# Patient Record
Sex: Female | Born: 1945 | Race: White | Hispanic: No | Smoking: Never smoker
Health system: Southern US, Community
[De-identification: ages and names within clinical notes are randomized; demographics above are authoritative.]

## PROBLEM LIST (undated history)

## (undated) DIAGNOSIS — R7303 Prediabetes: Secondary | ICD-10-CM

## (undated) DIAGNOSIS — Z87442 Personal history of urinary calculi: Secondary | ICD-10-CM

## (undated) DIAGNOSIS — D75839 Thrombocytosis, unspecified: Secondary | ICD-10-CM

## (undated) DIAGNOSIS — M4307 Spondylolysis, lumbosacral region: Secondary | ICD-10-CM

## (undated) DIAGNOSIS — K921 Melena: Secondary | ICD-10-CM

## (undated) DIAGNOSIS — I10 Essential (primary) hypertension: Secondary | ICD-10-CM

## (undated) DIAGNOSIS — Z860100 Personal history of colon polyps, unspecified: Secondary | ICD-10-CM

## (undated) DIAGNOSIS — K5792 Diverticulitis of intestine, part unspecified, without perforation or abscess without bleeding: Secondary | ICD-10-CM

## (undated) DIAGNOSIS — M2012 Hallux valgus (acquired), left foot: Secondary | ICD-10-CM

## (undated) DIAGNOSIS — K219 Gastro-esophageal reflux disease without esophagitis: Secondary | ICD-10-CM

## (undated) DIAGNOSIS — M179 Osteoarthritis of knee, unspecified: Secondary | ICD-10-CM

## (undated) DIAGNOSIS — E7800 Pure hypercholesterolemia, unspecified: Secondary | ICD-10-CM

## (undated) DIAGNOSIS — M47816 Spondylosis without myelopathy or radiculopathy, lumbar region: Secondary | ICD-10-CM

## (undated) DIAGNOSIS — Z87898 Personal history of other specified conditions: Secondary | ICD-10-CM

## (undated) DIAGNOSIS — M0600 Rheumatoid arthritis without rheumatoid factor, unspecified site: Secondary | ICD-10-CM

## (undated) DIAGNOSIS — Z8619 Personal history of other infectious and parasitic diseases: Secondary | ICD-10-CM

## (undated) HISTORY — PX: DILATION AND CURETTAGE OF UTERUS: SHX78

## (undated) HISTORY — DX: Personal history of other specified conditions: Z87.898

## (undated) HISTORY — DX: Personal history of colon polyps, unspecified: Z86.0100

## (undated) HISTORY — DX: Personal history of colonic polyps: Z86.010

## (undated) HISTORY — DX: Melena: K92.1

## (undated) HISTORY — DX: Pure hypercholesterolemia, unspecified: E78.00

## (undated) HISTORY — DX: Essential (primary) hypertension: I10

## (undated) HISTORY — DX: Diverticulitis of intestine, part unspecified, without perforation or abscess without bleeding: K57.92

## (undated) HISTORY — DX: Rheumatoid arthritis without rheumatoid factor, unspecified site: M06.00

## (undated) HISTORY — DX: Personal history of other infectious and parasitic diseases: Z86.19

## (undated) HISTORY — PX: COLONOSCOPY: SHX174

---

## 2008-09-06 LAB — HM COLONOSCOPY

## 2009-07-16 HISTORY — PX: LEG / ANKLE SOFT TISSUE BIOPSY: SUR148

## 2010-10-31 ENCOUNTER — Ambulatory Visit (INDEPENDENT_AMBULATORY_CARE_PROVIDER_SITE_OTHER): Payer: Self-pay | Admitting: Family Medicine

## 2010-10-31 ENCOUNTER — Encounter: Payer: Self-pay | Admitting: Family Medicine

## 2010-10-31 DIAGNOSIS — Z1239 Encounter for other screening for malignant neoplasm of breast: Secondary | ICD-10-CM | POA: Insufficient documentation

## 2010-10-31 DIAGNOSIS — E785 Hyperlipidemia, unspecified: Secondary | ICD-10-CM

## 2010-10-31 DIAGNOSIS — Z131 Encounter for screening for diabetes mellitus: Secondary | ICD-10-CM

## 2010-10-31 DIAGNOSIS — Z860100 Personal history of colon polyps, unspecified: Secondary | ICD-10-CM

## 2010-10-31 DIAGNOSIS — K5792 Diverticulitis of intestine, part unspecified, without perforation or abscess without bleeding: Secondary | ICD-10-CM

## 2010-10-31 DIAGNOSIS — M1990 Unspecified osteoarthritis, unspecified site: Secondary | ICD-10-CM

## 2010-10-31 DIAGNOSIS — G4700 Insomnia, unspecified: Secondary | ICD-10-CM

## 2010-10-31 DIAGNOSIS — Z1231 Encounter for screening mammogram for malignant neoplasm of breast: Secondary | ICD-10-CM

## 2010-10-31 DIAGNOSIS — K5732 Diverticulitis of large intestine without perforation or abscess without bleeding: Secondary | ICD-10-CM

## 2010-10-31 DIAGNOSIS — M129 Arthropathy, unspecified: Secondary | ICD-10-CM

## 2010-10-31 DIAGNOSIS — K921 Melena: Secondary | ICD-10-CM

## 2010-10-31 NOTE — Progress Notes (Signed)
Subjective:    [name], female    DOB: Mar 26, 1946, 65 y.o.   MRN: [id number]  HPI Here to establish -- just moved from [place] back to [place]  Retired from career with [employer]  Is adj to retirement   Her mother is seen by Dr Ermalene Searing -- she is 54-- takes care of her a bit   Hx of arthritis - on mtx -- ? What kind -- both ? Autoimmune and osteo  Seeing Lavenia Atlas for that now  Also takes folic acid   Hx of colon polyps and diverticulosis - no infection -- gets occ cramping  Last colonoscopy was within last 2 years -- in [place] -- rec 10 y f/u    (polyps were found many years ago and last few colonosc were clear)  Insomnia -- is better since she stopped working -- no longer has to use med all the time   Hyperlipidemia - is supposed to be on simvatin   40 -- ran out in January  Diet is not as good as it should be  Walks 2 mi per day with her dog   Health mt -- last gyn exam was pap January  Last chol test was then as well   fam hx of DM   Past Medical History  Diagnosis Date  . Arthritis   . Blood in the stool     10 yrs ago  . History of chicken pox   . Diverticulitis   . Hyperlipidemia   . History of colon polyps    Past Surgical History  Procedure Date  . Leg / ankle soft tissue biopsy 2011    to confirm arthritis    reports that she has never smoked. She does not have any smokeless tobacco history on file. She reports that she does not drink alcohol or use illicit drugs. family history includes Arthritis in her father and mother; Diabetes in her father; Heart disease in her father and mother; Hyperlipidemia in her father and mother; Hypertension in her father and mother; and Pulmonary fibrosis in her father. Allergies  Allergen Reactions  . Sumycin (Tetracycline Hcl) Rash         Review of Systems  Constitutional: Negative for fever, chills and fatigue.  HENT: Negative for sore throat and sinus pressure.     Eyes: Negative for pain and visual disturbance.  Respiratory: Negative for chest tightness and wheezing.   Cardiovascular: Negative.   Gastrointestinal: Negative for abdominal pain, diarrhea, constipation and blood in stool.  Genitourinary: Negative for urgency, frequency and pelvic pain.  Musculoskeletal: Positive for joint swelling and arthralgias. Negative for myalgias.  Skin: Negative.   Neurological: Negative for weakness, light-headedness, numbness and headaches.  Hematological: Negative for adenopathy. Does not bruise/bleed easily.  Psychiatric/Behavioral: Negative for dysphoric mood. The patient is not nervous/anxious.        Objective:   Physical Exam  Constitutional: She appears well-developed and well-nourished.       overwt and well appearing   HENT:  Head: Normocephalic and atraumatic.  Right Ear: External ear normal.  Left Ear: External ear normal.  Nose: Nose normal.  Mouth/Throat: Oropharynx is clear and moist.  Eyes: Conjunctivae and EOM are normal. Pupils are equal, round, and reactive to light.  Neck: Normal range of motion. Neck supple. No JVD present. Carotid bruit is not present. No thyromegaly present.  Cardiovascular: Normal rate, regular rhythm and normal heart sounds.   Pulmonary/Chest: Effort normal and breath  sounds normal.  Abdominal: Soft. Bowel sounds are normal. She exhibits no mass. There is no tenderness.  Musculoskeletal: She exhibits tenderness. She exhibits no edema.  Lymphadenopathy:    She has no cervical adenopathy.  Neurological: She is alert. She has normal reflexes. No cranial nerve deficit. Coordination normal.  Skin: Skin is warm and dry.  Psychiatric: She has a normal mood and affect.          Assessment & Plan:

## 2010-10-31 NOTE — Assessment & Plan Note (Signed)
sched mam  Do breast exam at check up in jan

## 2010-10-31 NOTE — Assessment & Plan Note (Signed)
Disc low sat fat diet in detail Re check lipids 2 wk If high- will pick different statin from simvastatin

## 2010-10-31 NOTE — Assessment & Plan Note (Signed)
?   Autoimmune plus OA On MTX Get notes from Dr Gavin Potters

## 2010-10-31 NOTE — Assessment & Plan Note (Signed)
Check fasting sugar with 2 wk labs  Is overwt

## 2010-10-31 NOTE — Patient Instructions (Addendum)
Please send for last note and labs from [facility]  Avoid red meat/ fried foods/ egg yolks/ fatty breakfast meats/ butter, cheese and high fat dairy/ and shellfish   Please call for last colonoscopy report from [facility] Schedule fasting labs in 2 weeks Schedule 30 minute check up in January  We will schedule mammogram at check out

## 2010-10-31 NOTE — Assessment & Plan Note (Signed)
Sent for last colonosc- which was clear per pt

## 2010-11-02 ENCOUNTER — Encounter: Payer: Self-pay | Admitting: Family Medicine

## 2010-11-10 ENCOUNTER — Encounter: Payer: Self-pay | Admitting: Family Medicine

## 2010-11-14 ENCOUNTER — Encounter: Payer: Self-pay | Admitting: Family Medicine

## 2010-11-15 ENCOUNTER — Other Ambulatory Visit (INDEPENDENT_AMBULATORY_CARE_PROVIDER_SITE_OTHER): Payer: Medicare Other | Admitting: Family Medicine

## 2010-11-15 DIAGNOSIS — Z131 Encounter for screening for diabetes mellitus: Secondary | ICD-10-CM

## 2010-11-15 DIAGNOSIS — E785 Hyperlipidemia, unspecified: Secondary | ICD-10-CM

## 2010-11-15 LAB — LIPID PANEL
Cholesterol: 219 mg/dL — ABNORMAL HIGH (ref 0–200)
Total CHOL/HDL Ratio: 6

## 2010-11-17 ENCOUNTER — Encounter: Payer: Self-pay | Admitting: Family Medicine

## 2010-11-20 ENCOUNTER — Telehealth: Payer: Self-pay

## 2010-11-20 MED ORDER — ATORVASTATIN CALCIUM 10 MG PO TABS: 10.0000 mg | ORAL_TABLET | Freq: Every day | ORAL | Status: DC

## 2010-11-20 NOTE — Telephone Encounter (Signed)
Order on px pad for lipid/ast/alt 272 in approx 6 weeks In IN box

## 2010-11-20 NOTE — Telephone Encounter (Signed)
Lipitor 10 mg rx sent electronically to [facility] as instructed. Pt request written order for lab work  Lipid,ast,alt 272  And copy of lab report mailed to [address] [place] [address].

## 2010-11-20 NOTE — Telephone Encounter (Signed)
Pt called back pt picked up new Lipitor rx and was charged $45.00 copay. Pt will ck with insurance co and let Dr Milinda Antis know which statin meds they cover with a better co pay. Pt said when she was on Simvastatin she only pd $6 copay. Pt will callback with info before next refill time.

## 2010-11-20 NOTE — Telephone Encounter (Signed)
Lab order and copy of most recent lab results mailed to pt's home as requested.

## 2010-12-05 ENCOUNTER — Telehealth: Payer: Self-pay | Admitting: *Deleted

## 2010-12-05 MED ORDER — SIMVASTATIN 20 MG PO TABS: 20.0000 mg | ORAL_TABLET | Freq: Every evening | ORAL | Status: DC

## 2010-12-05 NOTE — Telephone Encounter (Signed)
Patient notified as instructed by telephone.  Medication phoned to [facility] as instructed. Pt already has a rx for lab order for lipid,ast,alt to be drawn at [facility].

## 2010-12-05 NOTE — Telephone Encounter (Signed)
Patient says that she would like to switch from lipitor to simvastatin. She says that the lipitor has a $45 copay and she can get the simvastatin with a $3 copay. Uses [facility] on [address].

## 2010-12-05 NOTE — Telephone Encounter (Signed)
Let her know that simvastain / zocor is less potent so we have to step up to 20 mg  If muscle aches or pains let me know  Px written for call in   sched fasting lab in 6 weeks for lipid/ast/alt 272

## 2010-12-06 ENCOUNTER — Ambulatory Visit: Payer: Self-pay | Admitting: Family Medicine

## 2011-04-25 ENCOUNTER — Ambulatory Visit (INDEPENDENT_AMBULATORY_CARE_PROVIDER_SITE_OTHER): Payer: Medicare Other

## 2011-04-25 DIAGNOSIS — Z23 Encounter for immunization: Secondary | ICD-10-CM

## 2011-07-27 ENCOUNTER — Ambulatory Visit: Payer: Medicare Other | Admitting: Family Medicine

## 2011-10-23 ENCOUNTER — Ambulatory Visit (INDEPENDENT_AMBULATORY_CARE_PROVIDER_SITE_OTHER): Payer: Medicare Other | Admitting: Family Medicine

## 2011-10-23 ENCOUNTER — Encounter: Payer: Self-pay | Admitting: Family Medicine

## 2011-10-23 VITALS — BP 110/72 | HR 85 | Temp 98.0°F | Ht 61.75 in | Wt 179.0 lb

## 2011-10-23 DIAGNOSIS — E669 Obesity, unspecified: Secondary | ICD-10-CM | POA: Insufficient documentation

## 2011-10-23 DIAGNOSIS — Z1231 Encounter for screening mammogram for malignant neoplasm of breast: Secondary | ICD-10-CM

## 2011-10-23 DIAGNOSIS — Z860100 Personal history of colon polyps, unspecified: Secondary | ICD-10-CM

## 2011-10-23 DIAGNOSIS — E785 Hyperlipidemia, unspecified: Secondary | ICD-10-CM

## 2011-10-23 DIAGNOSIS — Z131 Encounter for screening for diabetes mellitus: Secondary | ICD-10-CM

## 2011-10-23 LAB — LIPID PANEL
Cholesterol: 128 mg/dL (ref 0–200)
HDL: 37.4 mg/dL — ABNORMAL LOW (ref >39.00)
LDL Cholesterol: 68 mg/dL (ref 0–99)
VLDL: 22.8 mg/dL (ref 0.0–40.0)

## 2011-10-23 LAB — COMPREHENSIVE METABOLIC PANEL
BUN: 24 mg/dL — ABNORMAL HIGH (ref 6–23)
CO2: 26 mEq/L (ref 19–32)
Calcium: 9.1 mg/dL (ref 8.4–10.5)
Chloride: 106 mEq/L (ref 96–112)
Creatinine, Ser: 0.6 mg/dL (ref 0.4–1.2)
GFR: 98.34 mL/min (ref >60.00)

## 2011-10-23 LAB — TSH: TSH: 0.96 u[IU]/mL (ref 0.35–5.50)

## 2011-10-23 MED ORDER — SIMVASTATIN 20 MG PO TABS: 20.0000 mg | ORAL_TABLET | Freq: Every evening | ORAL | Status: DC

## 2011-10-23 NOTE — Assessment & Plan Note (Signed)
On zocor and diet  Lab today Disc goals for lipids and reasons to control them Rev labs with pt- last draw  Rev low sat fat diet in detail

## 2011-10-23 NOTE — Assessment & Plan Note (Signed)
Per pt last colonosc was in 2010 - and changed recall to 10 years No symptoms or bowel changes

## 2011-10-23 NOTE — Progress Notes (Signed)
Subjective:    [name], female    DOB: 1945-07-28, 66 y.o.   MRN: [id number]  HPI Here for check up of chronic medical conditions and to review health mt list  Has been feeling great overall   Normal aging things- aches and pains  Arthritis is about the same - on mobic and and also methotrexate   Rheum sees her/ blood work every 3 months   Wt is stable with bmi of 33 Diet- getting better and lost 7 lb - cutting back on sweets and carbs  Exercise-- walks 30 minutes every day   bp good 110/72  Lipids last check high with LDL in 150s Lab Results  Component Value Date   CHOL 219* 11/15/2010   HDL 35.40* 11/15/2010   LDLDIRECT 153.7 11/15/2010   TRIG 177.0* 11/15/2010   CHOLHDL 6 11/15/2010   then had them checked again at rheum Is on zocor - and no problems  Diet is fair overal and getting better   Glucose last 94  Pneumovax- declines   colonosc 2010 nl with 10 y f/u  Hx of polyps  mammo 5/12- is due for that  Self exam - no lumps   Last gyn exam - at least 1-2 years  No hx of abn paps No symptoms   dexa -- was done last week - with Dr Gavin Potters - waiting on results  Takes ca and D - every day   Patient Active Problem List  Diagnoses  . Arthritis  . Blood in the stool  . Diverticulitis  . Hyperlipidemia  . History of colon polyps  . Insomnia  . Screening for diabetes mellitus  . Other screening mammogram  . Obesity   Past Medical History  Diagnosis Date  . Blood in the stool     10 yrs ago  . History of chicken pox   . Diverticulitis   . Hyperlipidemia   . History of colon polyps   . Seronegative rheumatoid arthritis    Past Surgical History  Procedure Date  . Leg / ankle soft tissue biopsy 2011    to confirm arthritis   History  Substance Use Topics  . Smoking status: Never Smoker   . Smokeless tobacco: Not on file  . Alcohol Use: No   Family History  Problem Relation Age of Onset  . Arthritis Mother   . Heart disease Mother    . Hyperlipidemia Mother   . Hypertension Mother   . Arthritis Father   . Hyperlipidemia Father   . Heart disease Father   . Hypertension Father   . Diabetes Father   . Pulmonary fibrosis Father    Allergies  Allergen Reactions  . Sumycin (Tetracycline Hcl) Rash   Current Outpatient Prescriptions on File Prior to Visit  Medication Sig Dispense Refill  . aspirin 81 MG tablet Take 81 mg by mouth daily.        . calcium elemental as carbonate (TUMS ULTRA 1000) 400 MG tablet Chew 1,000 mg by mouth daily.        . diclofenac sodium (VOLTAREN) 1 % GEL Apply twice a day to affected area Rheumatologist prescribes       . Doxylamine Succinate, Sleep, (SLEEP AID PO) OTC as directed.       . folic acid (FOLVITE) 1 MG tablet Take 1 mg by mouth daily.        . meloxicam (MOBIC) 7.5 MG tablet Take 7.5 mg by mouth daily. with food       .  Omega-3 Fatty Acids (FISH OIL) 1000 MG CAPS Take 1,000 mg by mouth daily.        . simvastatin (ZOCOR) 20 MG tablet Take 1 tablet (20 mg total) by mouth every evening.  90 tablet  3      Review of Systems Review of Systems  Constitutional: Negative for fever, appetite change, fatigue and unexpected weight change.  Eyes: Negative for pain and visual disturbance.  Respiratory: Negative for cough and shortness of breath.   Cardiovascular: Negative for cp or palpitations    Gastrointestinal: Negative for nausea, diarrhea and constipation.  Genitourinary: Negative for urgency and frequency.  Skin: Negative for pallor or rash   Neurological: Negative for weakness, light-headedness, numbness and headaches.  Hematological: Negative for adenopathy. Does not bruise/bleed easily.  Psychiatric/Behavioral: Negative for dysphoric mood. The patient is not nervous/anxious.          Objective:   Physical Exam  Constitutional: She appears well-developed and well-nourished. No distress.  HENT:  Head: Normocephalic and atraumatic.  Right Ear: External ear normal.    Left Ear: External ear normal.  Nose: Nose normal.  Mouth/Throat: Oropharynx is clear and moist.  Eyes: Conjunctivae and EOM are normal. Pupils are equal, round, and reactive to light. No scleral icterus.  Neck: Normal range of motion. Neck supple. No JVD present. Carotid bruit is not present. No thyromegaly present.  Cardiovascular: Normal rate, regular rhythm, normal heart sounds and intact distal pulses.  Exam reveals no gallop.   Pulmonary/Chest: Effort normal and breath sounds normal. No respiratory distress. She has no wheezes.  Abdominal: Soft. Bowel sounds are normal. She exhibits no distension, no abdominal bruit and no mass. There is no tenderness.  Genitourinary: No breast swelling, tenderness, discharge or bleeding.       Breast exam: No mass, nodules, thickening, tenderness, bulging, retraction, inflamation, nipple discharge or skin changes noted.  No axillary or clavicular LA.  Chaperoned exam.    Musculoskeletal: Normal range of motion. She exhibits no edema and no tenderness.  Lymphadenopathy:    She has no cervical adenopathy.  Neurological: She is alert. She has normal reflexes. No cranial nerve deficit. She exhibits normal muscle tone. Coordination normal.  Skin: Skin is warm and dry. No rash noted. No erythema. No pallor.  Psychiatric: She has a normal mood and affect.          Assessment & Plan:

## 2011-10-23 NOTE — Patient Instructions (Signed)
Labs today  Keep working on healthy diet and exercise for weight loss  We will schedule mammogram at check out  Try to get 1200-1500 mg of calcium per day with at least 1000 iu of vitamin D - for bone health

## 2011-10-23 NOTE — Assessment & Plan Note (Signed)
With obesity pt is at risk  Talked about imp of wt loss cmet today incl fasting glucose

## 2011-10-23 NOTE — Assessment & Plan Note (Signed)
Discussed how this problem influences overall health and the risks it imposes  Reviewed plan for weight loss with lower calorie diet (via better food choices and also portion control or program like weight watchers) and exercise building up to or more than 30 minutes 5 days per week including some aerobic activity    

## 2011-10-23 NOTE — Assessment & Plan Note (Signed)
Scheduled annual screening mammogram Nl breast exam today  Encouraged monthly self exams   

## 2011-10-25 ENCOUNTER — Encounter: Payer: Self-pay | Admitting: *Deleted

## 2011-10-30 ENCOUNTER — Encounter: Payer: Self-pay | Admitting: Family Medicine

## 2011-11-05 ENCOUNTER — Telehealth: Payer: Self-pay | Admitting: *Deleted

## 2011-11-05 NOTE — Telephone Encounter (Signed)
Received a form by mail (Annual Wellness Exam Reward Form) from patient requesting that we fill it out and mail it in for her.  Is this ok to do?  Form in your IN basket.

## 2011-11-05 NOTE — Telephone Encounter (Signed)
Form completed and mailed to [facility], patient advised via message left on machine at home number.

## 2011-11-05 NOTE — Telephone Encounter (Signed)
Done and in IN box thanks 

## 2011-12-07 ENCOUNTER — Ambulatory Visit: Payer: Self-pay | Admitting: Family Medicine

## 2011-12-11 ENCOUNTER — Encounter: Payer: Self-pay | Admitting: Family Medicine

## 2011-12-12 ENCOUNTER — Encounter: Payer: Self-pay | Admitting: *Deleted

## 2011-12-12 ENCOUNTER — Other Ambulatory Visit: Payer: Self-pay | Admitting: Family Medicine

## 2011-12-15 ENCOUNTER — Other Ambulatory Visit: Payer: Self-pay | Admitting: Family Medicine

## 2012-04-08 ENCOUNTER — Ambulatory Visit (INDEPENDENT_AMBULATORY_CARE_PROVIDER_SITE_OTHER): Payer: Medicare Other

## 2012-04-08 DIAGNOSIS — Z23 Encounter for immunization: Secondary | ICD-10-CM

## 2012-12-09 ENCOUNTER — Other Ambulatory Visit: Payer: Self-pay

## 2012-12-09 MED ORDER — SIMVASTATIN 20 MG PO TABS: 20.0000 mg | ORAL_TABLET | Freq: Every evening | ORAL | Status: DC

## 2012-12-09 NOTE — Telephone Encounter (Signed)
Pt left v/m requesting refill simvastatin; pt scheduled CPX 02/13/13. Notified pt refill done.

## 2013-02-13 ENCOUNTER — Encounter: Payer: Medicare Other | Admitting: Family Medicine

## 2013-03-17 ENCOUNTER — Encounter: Payer: Self-pay | Admitting: Internal Medicine

## 2013-03-17 ENCOUNTER — Ambulatory Visit (INDEPENDENT_AMBULATORY_CARE_PROVIDER_SITE_OTHER): Payer: Medicare Other | Admitting: Internal Medicine

## 2013-03-17 VITALS — BP 122/82 | HR 90 | Temp 98.1°F | Ht 61.25 in | Wt 178.5 lb

## 2013-03-17 DIAGNOSIS — K5792 Diverticulitis of intestine, part unspecified, without perforation or abscess without bleeding: Secondary | ICD-10-CM

## 2013-03-17 DIAGNOSIS — Z86010 Personal history of colonic polyps: Secondary | ICD-10-CM

## 2013-03-17 DIAGNOSIS — N318 Other neuromuscular dysfunction of bladder: Secondary | ICD-10-CM

## 2013-03-17 DIAGNOSIS — K5732 Diverticulitis of large intestine without perforation or abscess without bleeding: Secondary | ICD-10-CM

## 2013-03-17 DIAGNOSIS — N3281 Overactive bladder: Secondary | ICD-10-CM

## 2013-03-17 DIAGNOSIS — Z800 Family history of malignant neoplasm of digestive organs: Secondary | ICD-10-CM

## 2013-03-17 DIAGNOSIS — R351 Nocturia: Secondary | ICD-10-CM

## 2013-03-17 DIAGNOSIS — E785 Hyperlipidemia, unspecified: Secondary | ICD-10-CM

## 2013-03-17 DIAGNOSIS — Z23 Encounter for immunization: Secondary | ICD-10-CM

## 2013-03-17 DIAGNOSIS — M129 Arthropathy, unspecified: Secondary | ICD-10-CM

## 2013-03-17 DIAGNOSIS — Z1231 Encounter for screening mammogram for malignant neoplasm of breast: Secondary | ICD-10-CM

## 2013-03-17 DIAGNOSIS — M1990 Unspecified osteoarthritis, unspecified site: Secondary | ICD-10-CM

## 2013-03-17 LAB — URINALYSIS, ROUTINE W REFLEX MICROSCOPIC
Bilirubin Urine: NEGATIVE
Hgb urine dipstick: NEGATIVE
Nitrite: NEGATIVE
Urobilinogen, UA: 0.2 (ref 0.0–1.0)

## 2013-03-17 LAB — CBC WITH DIFFERENTIAL/PLATELET
Basophils Absolute: 0.1 10*3/uL (ref 0.0–0.1)
Eosinophils Absolute: 0.2 10*3/uL (ref 0.0–0.7)
HCT: 40.5 % (ref 36.0–46.0)
Lymphs Abs: 1.5 10*3/uL (ref 0.7–4.0)
Monocytes Absolute: 0.6 10*3/uL (ref 0.1–1.0)
Monocytes Relative: 8.3 % (ref 3.0–12.0)
Platelets: 309 10*3/uL (ref 150.0–400.0)
RDW: 16.6 % — ABNORMAL HIGH (ref 11.5–14.6)

## 2013-03-17 LAB — LIPID PANEL
LDL Cholesterol: 78 mg/dL (ref 0–99)
VLDL: 29 mg/dL (ref 0.0–40.0)

## 2013-03-17 LAB — COMPREHENSIVE METABOLIC PANEL
ALT: 35 U/L (ref 0–35)
AST: 32 U/L (ref 0–37)
Alkaline Phosphatase: 84 U/L (ref 39–117)
Sodium: 135 mEq/L (ref 135–145)
Total Bilirubin: 0.7 mg/dL (ref 0.3–1.2)
Total Protein: 7.2 g/dL (ref 6.0–8.3)

## 2013-03-18 ENCOUNTER — Other Ambulatory Visit: Payer: Self-pay | Admitting: *Deleted

## 2013-03-18 MED ORDER — TOLTERODINE TARTRATE 2 MG PO TABS: 2.0000 mg | ORAL_TABLET | Freq: Two times a day (BID) | ORAL | Status: DC

## 2013-03-18 NOTE — Telephone Encounter (Signed)
Rx sent through [facility] & pt notified

## 2013-03-19 ENCOUNTER — Telehealth: Payer: Self-pay | Admitting: *Deleted

## 2013-03-19 ENCOUNTER — Encounter: Payer: Self-pay | Admitting: Internal Medicine

## 2013-03-19 DIAGNOSIS — Z800 Family history of malignant neoplasm of digestive organs: Secondary | ICD-10-CM | POA: Insufficient documentation

## 2013-03-19 DIAGNOSIS — N3281 Overactive bladder: Secondary | ICD-10-CM | POA: Insufficient documentation

## 2013-03-19 NOTE — Assessment & Plan Note (Signed)
Up to date with colonoscopy.  Obtain records.

## 2013-03-19 NOTE — Assessment & Plan Note (Signed)
No problems reported.  Bowels stable.  Follow.

## 2013-03-19 NOTE — Progress Notes (Signed)
Subjective:    [name], female    DOB: 06/18/1946, 67 y.o.   MRN: [id number]  HPI 67 year old female with past history of seronegative arthritis and hypercholesterolemia who comes in today to follow up on these issues as well as to establish care.  Former pt of Dr [employer].  States she has a history of abnormal pap in the 70s.  Last pap 2011 - ok.  She is on simvastatin for her cholesterol.  Ran out last week.  Sees Dr Gavin Potters for her arthritis.  She reports having DDD and spondylolisthesis - back.  Seronegative arthritis.  On MTX and uses voltaren gel.  Also, takes Meloxicam.  Seeing Dr Yves Dill for her back.  Gives her Tramadol.  Walks everyday.  No cardiac symptoms with increased activity or exertion.  Breathing stable.  No increased acid reflux.  Eating and drinking well.  Does report having increased urinary frequency and nocturia.  Has to get up 3-4x/night.  Wants something to treat this.  Is up to date with colonoscopy.  Recommended follow up colonoscopy 10 years.     Past Medical History  Diagnosis Date  . Blood in the stool     10 yrs ago  . History of chicken pox   . Diverticulitis   . Hypercholesterolemia   . History of colon polyps   . Seronegative rheumatoid arthritis   . History of abnormal Pap smear     Outpatient Encounter Prescriptions as of 03/17/2013  Medication Sig Dispense Refill  . aspirin 81 MG tablet Take 81 mg by mouth daily.        . calcium elemental as carbonate (TUMS ULTRA 1000) 400 MG tablet Chew 1,000 mg by mouth daily.        . diclofenac sodium (VOLTAREN) 1 % GEL Apply twice a day to affected area Rheumatologist prescribes       . Doxylamine Succinate, Sleep, (SLEEP AID PO) OTC as directed.       . folic acid (FOLVITE) 1 MG tablet Take 1 mg by mouth daily.        . meloxicam (MOBIC) 7.5 MG tablet Take 7.5 mg by mouth daily. with food       . methotrexate (RHEUMATREX) 10 MG tablet Take 10 mg by mouth once a week. Caution: Chemotherapy.  Protect from light.      . Multiple Vitamins-Minerals (CENTRUM ULTRA WOMENS PO) Take by mouth.      . Omega-3 Fatty Acids (FISH OIL) 1000 MG CAPS Take 1,000 mg by mouth daily.        . simvastatin (ZOCOR) 20 MG tablet Take 1 tablet (20 mg total) by mouth every evening.  90 tablet  0  . traMADol (ULTRAM) 50 MG tablet Take 50 mg by mouth every 6 (six) hours as needed for pain.       No facility-administered encounter medications on file as of 03/17/2013.    Review of Systems Patient denies any headache, lightheadedness or dizziness.  No sinus or allergy symptoms.  No chest pain, tightness or palpitations.  No increased shortness of breath, cough or congestion.  No nausea or vomiting.  No acid reflux.  No abdominal pain or cramping.  No bowel change, such as diarrhea, constipation, BRBPR or melana.  Increased urinary frequency and nocturia as outlined.  No vaginal symptoms.  Walks daily.  Follows with Dr Gavin Potters for her arthritis.  Stable.       Objective:   Physical Exam Filed Vitals:  03/17/13 1042  BP: 122/82  Pulse: 90  Temp: 98.1 F (24.60 C)   67 year old female in no acute distress.   HEENT:  Nares- clear.  Oropharynx - without lesions. NECK:  Supple.  Nontender.  No audible bruit.  HEART:  Appears to be regular. LUNGS:  No crackles or wheezing audible.  Respirations even and unlabored.  RADIAL PULSE:  Equal bilaterally.  ABDOMEN:  Soft, nontender.  Bowel sounds present and normal.  No audible abdominal bruit.    EXTREMITIES:  No increased edema present.  DP pulses palpable and equal bilaterally.   SKIN:  No lestions.          Assessment & Plan:  HEALTH MAINTENANCE.  Will schedule her for a physical.  Review records.  Schedule mammogram when due.  Up to date with colonoscopy.  Obtain records.  States had recommended follow up colonoscopy in 10 years after last.  Pneumovax given today.   I spent 45 minutes with the patient and more than 50% of the time was spent in consultation  regarding the above.

## 2013-03-19 NOTE — Assessment & Plan Note (Signed)
Discussed at length with her today.  Present for a while.  Not resting well secondary to having to get up multiple times a night.  Check urinalysis to confirm no infection, etc.  Start Detrol 2mg  bid as directed.  Discussed possible side effects.  Follow.

## 2013-03-19 NOTE — Assessment & Plan Note (Signed)
States up to date with colonoscopy.  Obtain copy.  States recommended follow up in 10 years.

## 2013-03-19 NOTE — Assessment & Plan Note (Signed)
Low cholesterol diet and exercise.  On simvastatin.  Check lipid panel and liver function.    

## 2013-03-19 NOTE — Assessment & Plan Note (Signed)
Keep up to date with mammograms.

## 2013-03-19 NOTE — Telephone Encounter (Signed)
Received PA request form placed in Dr. Marina Goodell folder

## 2013-03-19 NOTE — Telephone Encounter (Signed)
Called [phone] for prior authorization on the Tolterodine 2 mg tab, form is being faxed over

## 2013-03-19 NOTE — Assessment & Plan Note (Signed)
Followed by Dr Gavin Potters.  On MTX.  Obtain records.  Also takes meloxicam and uses voltaren gel.  Follow.

## 2013-03-24 ENCOUNTER — Encounter: Payer: Self-pay | Admitting: Internal Medicine

## 2013-03-24 ENCOUNTER — Other Ambulatory Visit: Payer: Self-pay | Admitting: Family Medicine

## 2013-03-25 ENCOUNTER — Other Ambulatory Visit: Payer: Self-pay | Admitting: *Deleted

## 2013-03-25 ENCOUNTER — Telehealth: Payer: Self-pay | Admitting: Internal Medicine

## 2013-03-25 MED ORDER — SIMVASTATIN 20 MG PO TABS: 20.0000 mg | ORAL_TABLET | Freq: Every evening | ORAL | Status: DC

## 2013-03-25 NOTE — Telephone Encounter (Signed)
Faxes apparently were never received because this is the first refill request I have seen. Sent pt a mychart to let her know Rx has been sent to [facility].

## 2013-03-25 NOTE — Telephone Encounter (Signed)
[facility] states they faxed over request x2 days ago and this morning for simvastatin 20 mg.  Asking for confirmation that this was received.

## 2013-03-26 ENCOUNTER — Ambulatory Visit: Payer: Self-pay | Admitting: Internal Medicine

## 2013-04-03 ENCOUNTER — Encounter: Payer: Self-pay | Admitting: Internal Medicine

## 2013-04-03 MED ORDER — OXYBUTYNIN CHLORIDE ER 5 MG PO TB24: 5.0000 mg | ORAL_TABLET | Freq: Every day | ORAL | Status: DC

## 2013-04-03 NOTE — Telephone Encounter (Signed)
Please notify pt that I sent in the Ditropan for her to try.  See her my chart message.  She wanted to be called with the response.

## 2013-04-06 ENCOUNTER — Encounter: Payer: Self-pay | Admitting: Internal Medicine

## 2013-04-16 ENCOUNTER — Ambulatory Visit (INDEPENDENT_AMBULATORY_CARE_PROVIDER_SITE_OTHER): Payer: Medicare Other

## 2013-04-16 DIAGNOSIS — Z23 Encounter for immunization: Secondary | ICD-10-CM

## 2013-05-25 ENCOUNTER — Encounter: Payer: Self-pay | Admitting: Internal Medicine

## 2013-05-25 ENCOUNTER — Ambulatory Visit (INDEPENDENT_AMBULATORY_CARE_PROVIDER_SITE_OTHER): Payer: Medicare Other | Admitting: Internal Medicine

## 2013-05-25 VITALS — BP 130/90 | HR 84 | Temp 98.2°F | Ht 61.25 in | Wt 179.5 lb

## 2013-05-25 DIAGNOSIS — Z1231 Encounter for screening mammogram for malignant neoplasm of breast: Secondary | ICD-10-CM

## 2013-05-25 DIAGNOSIS — Z800 Family history of malignant neoplasm of digestive organs: Secondary | ICD-10-CM

## 2013-05-25 DIAGNOSIS — M1990 Unspecified osteoarthritis, unspecified site: Secondary | ICD-10-CM

## 2013-05-25 DIAGNOSIS — Z86010 Personal history of colonic polyps: Secondary | ICD-10-CM

## 2013-05-25 DIAGNOSIS — Z1211 Encounter for screening for malignant neoplasm of colon: Secondary | ICD-10-CM

## 2013-05-25 DIAGNOSIS — N3281 Overactive bladder: Secondary | ICD-10-CM

## 2013-05-25 DIAGNOSIS — E785 Hyperlipidemia, unspecified: Secondary | ICD-10-CM

## 2013-05-25 DIAGNOSIS — N318 Other neuromuscular dysfunction of bladder: Secondary | ICD-10-CM

## 2013-05-25 DIAGNOSIS — M129 Arthropathy, unspecified: Secondary | ICD-10-CM

## 2013-05-25 MED ORDER — TOLTERODINE TARTRATE 2 MG PO TABS: 2.0000 mg | ORAL_TABLET | Freq: Two times a day (BID) | ORAL | Status: DC

## 2013-05-25 NOTE — Progress Notes (Signed)
Pre-visit discussion using our clinic review tool. No additional management support is needed unless otherwise documented below in the visit note.  

## 2013-05-26 ENCOUNTER — Encounter: Payer: Self-pay | Admitting: Internal Medicine

## 2013-05-26 NOTE — Assessment & Plan Note (Signed)
Up to date with colonoscopy as outlined.    

## 2013-05-26 NOTE — Progress Notes (Signed)
Subjective:    [name], female    DOB: 11-19-1945, 67 y.o.   MRN: [id number]  HPI 67 year old female with past history of seronegative arthritis and hypercholesterolemia who comes in today to follow up on these issues as well as for a complete physical exam.  States she has a history of abnormal pap in the 70s.  Last pap 2011 - ok.  She is on simvastatin for her cholesterol.  Sees Dr Gavin Potters for her arthritis.  She reports having DDD and spondylolisthesis - back.  Seronegative arthritis.  On MTX and uses voltaren gel.  Also, takes Meloxicam.  Seeing Dr Yves Dill for her back.  Gives her Tramadol.  Walks everyday.  No cardiac symptoms with increased activity or exertion.  Breathing stable.  No increased acid reflux.  Eating and drinking well.  Does report having increased urinary frequency and nocturia.  I had put her on detrol last visit.  She wants to change to the short acting detrol.   Is up to date with colonoscopy.  Recommended follow up colonoscopy.  Overall she feels she is doing well.    Past Medical History  Diagnosis Date  . Blood in the stool     10 yrs ago  . History of chicken pox   . Diverticulitis   . Hypercholesterolemia   . History of colon polyps   . Seronegative rheumatoid arthritis   . History of abnormal Pap smear     Outpatient Encounter Prescriptions as of 05/25/2013  Medication Sig  . aspirin 81 MG tablet Take 81 mg by mouth daily.    . calcium elemental as carbonate (TUMS ULTRA 1000) 400 MG tablet Chew 1,000 mg by mouth daily.    . diclofenac sodium (VOLTAREN) 1 % GEL Apply twice a day to affected area Rheumatologist prescribes   . Doxylamine Succinate, Sleep, (SLEEP AID PO) OTC as directed.   . folic acid (FOLVITE) 1 MG tablet Take 1 mg by mouth daily.    . meloxicam (MOBIC) 7.5 MG tablet Take 7.5 mg by mouth daily. with food   . methotrexate (RHEUMATREX) 10 MG tablet Take 10 mg by mouth once a week. Caution: Chemotherapy. Protect from light.   . Multiple Vitamins-Minerals (CENTRUM ULTRA WOMENS PO) Take by mouth.  . Omega-3 Fatty Acids (FISH OIL) 1000 MG CAPS Take 1,000 mg by mouth daily.    . simvastatin (ZOCOR) 20 MG tablet Take 1 tablet (20 mg total) by mouth every evening.  . traMADol (ULTRAM) 50 MG tablet Take 50 mg by mouth every 6 (six) hours as needed for pain.  Marland Kitchen oxybutynin (DITROPAN XL) 5 MG 24 hr tablet Take 1 tablet (5 mg total) by mouth daily.  Marland Kitchen tolterodine (DETROL) 2 MG tablet Take 1 tablet (2 mg total) by mouth 2 (two) times daily.    Review of Systems Patient denies any headache, lightheadedness or dizziness.  No sinus or allergy symptoms.  No chest pain, tightness or palpitations.  No increased shortness of breath, cough or congestion.  No nausea or vomiting.  No acid reflux.  No abdominal pain or cramping.  No bowel change, such as diarrhea, constipation, BRBPR or melana.  Increased urinary frequency and nocturia as outlined.  No vaginal symptoms.  Walks daily.  Follows with Dr Gavin Potters for her arthritis.  Stable.       Objective:   Physical Exam  Filed Vitals:   05/25/13 1053  BP: 130/90  Pulse: 84  Temp: 98.2 F (36.8  C)   Blood pressure recheck:  60/63  67 year old female in no acute distress.   HEENT:  Nares- clear.  Oropharynx - without lesions. NECK:  Supple.  Nontender.  No audible bruit.  HEART:  Appears to be regular. LUNGS:  No crackles or wheezing audible.  Respirations even and unlabored.  RADIAL PULSE:  Equal bilaterally.    BREASTS:  No nipple discharge or nipple retraction present.  Could not appreciate any distinct nodules or axillary adenopathy.  ABDOMEN:  Soft, nontender.  Bowel sounds present and normal.  No audible abdominal bruit.  GU: not performed.    EXTREMITIES:  No increased edema present.  DP pulses palpable and equal bilaterally.          Assessment & Plan:  HEALTH MAINTENANCE.  Physical today.  Mammogram 03/26/13 - ok.  Up to date with colonoscopy.   States had  recommended follow up colonoscopy in 10 years after last.  Pneumovax given last visit.  IFOB given.

## 2013-05-26 NOTE — Assessment & Plan Note (Signed)
Start Detrol 2mg  bid as directed.  Discussed possible side effects.  Follow.

## 2013-05-26 NOTE — Assessment & Plan Note (Signed)
Mammogram 03/26/13 - ok.

## 2013-05-26 NOTE — Assessment & Plan Note (Signed)
Low cholesterol diet and exercise.  On simvastatin.  Check lipid panel and liver function.  Cholesterol 03/17/13 revealed total cholesterol 145, triglycerides 145, HDL 38 and LDL 78.

## 2013-05-26 NOTE — Assessment & Plan Note (Signed)
States up to date with colonoscopy.  Obtain copy.  States recommended follow up in 10 years.  She will provide this information for me.

## 2013-05-26 NOTE — Assessment & Plan Note (Signed)
Followed by Dr Gavin Potters.  On MTX.  Also takes meloxicam and uses voltaren gel.  Follow.

## 2013-05-28 ENCOUNTER — Encounter: Payer: Self-pay | Admitting: Internal Medicine

## 2013-05-28 MED ORDER — OXYBUTYNIN CHLORIDE 5 MG PO TABS: 5.0000 mg | ORAL_TABLET | Freq: Two times a day (BID) | ORAL | Status: DC

## 2013-05-28 NOTE — Telephone Encounter (Signed)
Sent in ditropan (immediate release) #60 with one refill.

## 2013-06-19 ENCOUNTER — Other Ambulatory Visit (INDEPENDENT_AMBULATORY_CARE_PROVIDER_SITE_OTHER): Payer: Medicare Other

## 2013-06-19 DIAGNOSIS — Z1211 Encounter for screening for malignant neoplasm of colon: Secondary | ICD-10-CM

## 2013-06-19 LAB — FECAL OCCULT BLOOD, IMMUNOCHEMICAL: Fecal Occult Bld: NEGATIVE

## 2013-06-22 ENCOUNTER — Encounter: Payer: Self-pay | Admitting: Internal Medicine

## 2013-06-24 NOTE — Telephone Encounter (Signed)
Mailed unread message to pt  

## 2013-08-10 ENCOUNTER — Encounter: Payer: Self-pay | Admitting: Podiatry

## 2013-08-10 ENCOUNTER — Ambulatory Visit (INDEPENDENT_AMBULATORY_CARE_PROVIDER_SITE_OTHER): Payer: Medicare Other

## 2013-08-10 ENCOUNTER — Ambulatory Visit (INDEPENDENT_AMBULATORY_CARE_PROVIDER_SITE_OTHER): Payer: Medicare Other | Admitting: Podiatry

## 2013-08-10 VITALS — BP 137/86 | HR 105 | Resp 16 | Ht 62.0 in | Wt 177.0 lb

## 2013-08-10 DIAGNOSIS — M21619 Bunion of unspecified foot: Secondary | ICD-10-CM

## 2013-08-10 DIAGNOSIS — M2010 Hallux valgus (acquired), unspecified foot: Secondary | ICD-10-CM

## 2013-08-10 DIAGNOSIS — M2040 Other hammer toe(s) (acquired), unspecified foot: Secondary | ICD-10-CM

## 2013-08-10 DIAGNOSIS — B351 Tinea unguium: Secondary | ICD-10-CM

## 2013-08-10 MED ORDER — EFINACONAZOLE 10 % EX SOLN: 1.0000 [drp] | Freq: Every day | CUTANEOUS | Status: DC

## 2013-08-10 NOTE — Progress Notes (Signed)
   Subjective:    [name], female    DOB: Nov 19, 1945, 68 y.o.   MRN: [id number]  HPI Comments: Both feet have bunions and also a hammer toe on the left foot which is making it difficult to wear shoes,  Its been  Ongoing just getting worse and its time do something about it. Also i have a toenail that is fungus. Right great toenail thick      Review of Systems  Musculoskeletal: Positive for back pain.  Hematological: Bruises/bleeds easily.  All other systems reviewed and are negative.       Objective:   Physical Exam: I have reviewed her past medical history medications allergies surgeries social history and review of systems. Vital signs are stable she is alert and oriented x3. Pulses are strongly palpable bilateral. Neurologic sensorium is intact bilateral. Deep tendon reflexes are 1 elicitable muscle strength 4/5 bilateral dorsiflexors plantar flexors inverters and everters all intrinsic musculature is intact. Orthopedic evaluation demonstrates severe hallux abductovalgus deformities bilateral. Severe hammertoe deformity second digit bilateral. Radiographic evaluation demonstrates complete dislocation of the first metatarsal and second metatarsophalangeal joints. 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 Cutaneous evaluation demonstrates supple while hydrated cutis for the most part. She does have some distal onychomycosis with sharp incurvated nail margins to the hallux right.        Assessment & Plan:  Assessment: Hallux abductovalgus deformity bilateral left greater than right. Complete rupture of her plantar plate with dislocation the second toe and hammertoe deformity bilateral. Left greater than right. Possible onychomycosis hallux right.  Plan: Discussed etiology pathology conservative versus surgical therapies. At this point we will wait until she returns from her trip to [place] during the summer prior to surgical intervention of her left  foot.

## 2013-08-10 NOTE — Patient Instructions (Signed)

## 2013-09-15 ENCOUNTER — Other Ambulatory Visit: Payer: Self-pay | Admitting: Internal Medicine

## 2013-11-23 ENCOUNTER — Other Ambulatory Visit: Payer: Medicare Other

## 2013-11-25 ENCOUNTER — Ambulatory Visit: Payer: Medicare Other | Admitting: Internal Medicine

## 2014-01-19 DIAGNOSIS — M5137 Other intervertebral disc degeneration, lumbosacral region: Secondary | ICD-10-CM | POA: Insufficient documentation

## 2014-01-19 DIAGNOSIS — M47817 Spondylosis without myelopathy or radiculopathy, lumbosacral region: Secondary | ICD-10-CM | POA: Insufficient documentation

## 2014-01-19 DIAGNOSIS — M51379 Other intervertebral disc degeneration, lumbosacral region without mention of lumbar back pain or lower extremity pain: Secondary | ICD-10-CM | POA: Insufficient documentation

## 2014-01-22 ENCOUNTER — Other Ambulatory Visit: Payer: Self-pay | Admitting: Internal Medicine

## 2014-03-17 ENCOUNTER — Other Ambulatory Visit: Payer: Self-pay | Admitting: Internal Medicine

## 2014-03-17 ENCOUNTER — Encounter: Payer: Self-pay | Admitting: Internal Medicine

## 2014-03-17 DIAGNOSIS — E7800 Pure hypercholesterolemia, unspecified: Secondary | ICD-10-CM

## 2014-03-17 MED ORDER — SIMVASTATIN 20 MG PO TABS: ORAL_TABLET | ORAL | Status: DC

## 2014-03-17 NOTE — Telephone Encounter (Signed)
See mychart message. Last fasting labs 9/14, want her to make lab appt sooner than November?

## 2014-03-17 NOTE — Progress Notes (Signed)
Orders placed for f/u labs.  

## 2014-03-25 ENCOUNTER — Other Ambulatory Visit (INDEPENDENT_AMBULATORY_CARE_PROVIDER_SITE_OTHER): Payer: Medicare Other

## 2014-03-25 ENCOUNTER — Encounter: Payer: Self-pay | Admitting: Internal Medicine

## 2014-03-25 DIAGNOSIS — E7800 Pure hypercholesterolemia, unspecified: Secondary | ICD-10-CM

## 2014-03-25 LAB — TSH: TSH: 2.4 u[IU]/mL (ref 0.35–4.50)

## 2014-03-25 LAB — CBC WITH DIFFERENTIAL/PLATELET
BASOS ABS: 0.1 10*3/uL (ref 0.0–0.1)
BASOS PCT: 0.8 % (ref 0.0–3.0)
EOS ABS: 0.3 10*3/uL (ref 0.0–0.7)
Eosinophils Relative: 3.7 % (ref 0.0–5.0)
HCT: 40.4 % (ref 36.0–46.0)
Hemoglobin: 13.2 g/dL (ref 12.0–15.0)
LYMPHS PCT: 27.4 % (ref 12.0–46.0)
Lymphs Abs: 1.9 10*3/uL (ref 0.7–4.0)
MCHC: 32.5 g/dL (ref 30.0–36.0)
MCV: 85.8 fl (ref 78.0–100.0)
MONO ABS: 0.6 10*3/uL (ref 0.1–1.0)
Monocytes Relative: 8.2 % (ref 3.0–12.0)
Neutro Abs: 4.2 10*3/uL (ref 1.4–7.7)
Neutrophils Relative %: 59.9 % (ref 43.0–77.0)
PLATELETS: 352 10*3/uL (ref 150.0–400.0)
RBC: 4.72 Mil/uL (ref 3.87–5.11)
RDW: 17.4 % — AB (ref 11.5–15.5)
WBC: 6.9 10*3/uL (ref 4.0–10.5)

## 2014-03-25 LAB — COMPREHENSIVE METABOLIC PANEL
ALBUMIN: 3.9 g/dL (ref 3.5–5.2)
ALK PHOS: 87 U/L (ref 39–117)
ALT: 24 U/L (ref 0–35)
AST: 26 U/L (ref 0–37)
BUN: 18 mg/dL (ref 6–23)
CHLORIDE: 104 meq/L (ref 96–112)
CO2: 25 mEq/L (ref 19–32)
Calcium: 9.2 mg/dL (ref 8.4–10.5)
Creatinine, Ser: 0.6 mg/dL (ref 0.4–1.2)
GFR: 99.43 mL/min (ref >60.00)
Glucose, Bld: 87 mg/dL (ref 70–99)
POTASSIUM: 4.3 meq/L (ref 3.5–5.1)
SODIUM: 137 meq/L (ref 135–145)
Total Bilirubin: 0.6 mg/dL (ref 0.2–1.2)
Total Protein: 7.2 g/dL (ref 6.0–8.3)

## 2014-03-25 LAB — LIPID PANEL
CHOL/HDL RATIO: 4
Cholesterol: 138 mg/dL (ref 0–200)
HDL: 39.3 mg/dL (ref >39.00)
LDL CALC: 75 mg/dL (ref 0–99)
NonHDL: 98.7
Triglycerides: 120 mg/dL (ref 0.0–149.0)
VLDL: 24 mg/dL (ref 0.0–40.0)

## 2014-05-01 ENCOUNTER — Ambulatory Visit: Payer: Medicare Other

## 2014-05-27 ENCOUNTER — Encounter: Payer: Self-pay | Admitting: Internal Medicine

## 2014-05-27 ENCOUNTER — Ambulatory Visit (INDEPENDENT_AMBULATORY_CARE_PROVIDER_SITE_OTHER): Payer: Medicare Other | Admitting: Internal Medicine

## 2014-05-27 VITALS — BP 136/87 | HR 84 | Temp 97.8°F | Ht 61.0 in | Wt 167.8 lb

## 2014-05-27 DIAGNOSIS — M1990 Unspecified osteoarthritis, unspecified site: Secondary | ICD-10-CM

## 2014-05-27 DIAGNOSIS — E669 Obesity, unspecified: Secondary | ICD-10-CM

## 2014-05-27 DIAGNOSIS — Z1239 Encounter for other screening for malignant neoplasm of breast: Secondary | ICD-10-CM

## 2014-05-27 DIAGNOSIS — Z86010 Personal history of colonic polyps: Secondary | ICD-10-CM

## 2014-05-27 DIAGNOSIS — Z800 Family history of malignant neoplasm of digestive organs: Secondary | ICD-10-CM

## 2014-05-27 DIAGNOSIS — E785 Hyperlipidemia, unspecified: Secondary | ICD-10-CM

## 2014-05-27 MED ORDER — SIMVASTATIN 20 MG PO TABS: ORAL_TABLET | ORAL | Status: DC

## 2014-05-27 NOTE — Progress Notes (Signed)
Pre visit review using our clinic review tool, if applicable. No additional management support is needed unless otherwise documented below in the visit note. 

## 2014-06-06 ENCOUNTER — Encounter: Payer: Self-pay | Admitting: Internal Medicine

## 2014-06-06 DIAGNOSIS — Z6831 Body mass index (BMI) 31.0-31.9, adult: Secondary | ICD-10-CM | POA: Insufficient documentation

## 2014-06-06 NOTE — Progress Notes (Signed)
Subjective:    [name], female    DOB: [birth year], 68 y.o.   MRN: [id number]  HPI 68 year old female with past history of seronegative arthritis and hypercholesterolemia who comes in today to follow up on these issues as well as for a complete physical exam.  States she has a history of abnormal pap in the 70s.  Last pap 2011 - ok.  She is on simvastatin for her cholesterol.  Sees Dr Jefm Bryant for her arthritis.  She reports having DDD and spondylolisthesis - back.  Seronegative arthritis.  On MTX and uses voltaren gel.  Also, takes Meloxicam.  Seeing Dr Sharlet Salina for her back.  Gives her Tramadol.  Taking meloxicam qod.  Walks everyday.  No cardiac symptoms with increased activity or exertion.  Breathing stable.  No increased acid reflux.  Eating and drinking well. Has adjusted her diet.  Has lost 15 pounds.  Walks 2 miles per day at least 3x per week.  Is up to date with colonoscopy.  Recommended follow up colonoscopy 10 years after last.  Last 2010.   Overall she feels she is doing well.  States she had a nurse home wellness visit.  Declines wellness visit today.     Past Medical History  Diagnosis Date  . Blood in the stool     10 yrs ago  . History of chicken pox   . Diverticulitis   . Hypercholesterolemia   . History of colon polyps   . Seronegative rheumatoid arthritis   . History of abnormal Pap smear     Outpatient Encounter Prescriptions as of 05/27/2014  Medication Sig  . aspirin 81 MG tablet Take 81 mg by mouth daily.    . calcium elemental as carbonate (TUMS ULTRA 1000) 400 MG tablet Chew 1,000 mg by mouth daily.    . Doxylamine Succinate, Sleep, (SLEEP AID PO) OTC as directed.   . folic acid (FOLVITE) 1 MG tablet Take 1 mg by mouth daily.    . meloxicam (MOBIC) 7.5 MG tablet Take 7.5 mg by mouth daily. with food   . methotrexate (RHEUMATREX) 10 MG tablet Take 10 mg by mouth once a week. Caution: Chemotherapy. Protect from light.  . Multiple Vitamins-Minerals  (CENTRUM ULTRA WOMENS PO) Take by mouth.  . Omega-3 Fatty Acids (FISH OIL) 1000 MG CAPS Take 1,000 mg by mouth daily.    . simvastatin (ZOCOR) 20 MG tablet TAKE  ONE TABLET BY MOUTH EVERY EVENING  . traMADol (ULTRAM) 50 MG tablet Take 50 mg by mouth every 6 (six) hours as needed for pain.  . [DISCONTINUED] diclofenac sodium (VOLTAREN) 1 % GEL Apply twice a day to affected area Rheumatologist prescribes   . [DISCONTINUED] Efinaconazole 10 % SOLN Apply 1 drop topically daily.  . [DISCONTINUED] simvastatin (ZOCOR) 20 MG tablet TAKE  ONE TABLET BY MOUTH EVERY EVENING    Review of Systems Patient denies any headache, lightheadedness or dizziness.  No sinus or allergy symptoms.  No chest pain, tightness or palpitations.  No increased shortness of breath, cough or congestion.  No nausea or vomiting.  No acid reflux.  No abdominal pain or cramping.  No bowel change, such as diarrhea, BRBPR or melana.  Uses miralax to help keep bowels regular.  No vaginal symptoms.  Walks regularly. Marland Kitchen  Follows with Dr Jefm Bryant for her arthritis.  Stable.       Objective:   Physical Exam  Filed Vitals:   05/27/14 1039  BP: 136/87  Pulse: 84  Temp: 97.8 F (36.6 C)   Blood pressure recheck:  Left 122/78, right 33/89  68 year old female in no acute distress.   HEENT:  Nares- clear.  Oropharynx - without lesions. NECK:  Supple.  Nontender.  No audible bruit.  HEART:  Appears to be regular. LUNGS:  No crackles or wheezing audible.  Respirations even and unlabored.  RADIAL PULSE:  Equal bilaterally.    BREASTS:  No nipple discharge or nipple retraction present.  Could not appreciate any distinct nodules or axillary adenopathy.  ABDOMEN:  Soft, nontender.  Bowel sounds present and normal.  No audible abdominal bruit.  GU: not performed.    EXTREMITIES:  No increased edema present.  DP pulses palpable and equal bilaterally.          Assessment & Plan:  1. Obesity (BMI 30-39.9) Diet and exercise.  Continue  exercise.  Follow.   2. Arthritis Followed by Dr Jefm Bryant.  On MTX.  Taking meloxicam qod now.  Follow.    3. Hyperlipidemia Low cholesterol diet and continue exercise.  On simvastatin.  Follow lipid panel and liver function.  Lab Results  Component Value Date   CHOL 138 03/25/2014   HDL 39.30 03/25/2014   LDLCALC 75 03/25/2014   LDLDIRECT 153.7 11/15/2010   TRIG 120.0 03/25/2014   CHOLHDL 4 03/25/2014   4. History of colon polyps Colonoscopy 2010.  Due f/u 2020 per pt.    5. Breast cancer screening - MM DIGITAL SCREENING BILATERAL; Future  6. Family history of colon cancer Had colonoscopy 2010.  States due 10 years.   HEALTH MAINTENANCE.  Physical today.  Mammogram 03/26/13 - ok.  Due f/u mammogram. Up to date with colonoscopy.   States had recommended follow up colonoscopy in 10 years after last.

## 2014-07-29 ENCOUNTER — Ambulatory Visit: Payer: Self-pay | Admitting: Internal Medicine

## 2014-07-29 DIAGNOSIS — Z1231 Encounter for screening mammogram for malignant neoplasm of breast: Secondary | ICD-10-CM | POA: Diagnosis not present

## 2014-07-29 LAB — HM MAMMOGRAPHY: HM Mammogram: NEGATIVE

## 2014-07-30 ENCOUNTER — Encounter: Payer: Self-pay | Admitting: *Deleted

## 2014-08-18 DIAGNOSIS — M1990 Unspecified osteoarthritis, unspecified site: Secondary | ICD-10-CM | POA: Diagnosis not present

## 2014-09-07 ENCOUNTER — Other Ambulatory Visit: Payer: Self-pay | Admitting: *Deleted

## 2014-09-07 MED ORDER — SIMVASTATIN 20 MG PO TABS: ORAL_TABLET | ORAL | Status: DC

## 2014-09-09 ENCOUNTER — Other Ambulatory Visit: Payer: Self-pay | Admitting: Internal Medicine

## 2014-10-26 DIAGNOSIS — M1990 Unspecified osteoarthritis, unspecified site: Secondary | ICD-10-CM | POA: Diagnosis not present

## 2014-11-08 DIAGNOSIS — M1990 Unspecified osteoarthritis, unspecified site: Secondary | ICD-10-CM | POA: Diagnosis not present

## 2014-11-08 DIAGNOSIS — M150 Primary generalized (osteo)arthritis: Secondary | ICD-10-CM | POA: Diagnosis not present

## 2014-11-08 DIAGNOSIS — G8929 Other chronic pain: Secondary | ICD-10-CM | POA: Diagnosis not present

## 2014-11-08 DIAGNOSIS — M25561 Pain in right knee: Secondary | ICD-10-CM | POA: Diagnosis not present

## 2014-11-25 ENCOUNTER — Ambulatory Visit (INDEPENDENT_AMBULATORY_CARE_PROVIDER_SITE_OTHER): Payer: Medicare Other | Admitting: Internal Medicine

## 2014-11-25 ENCOUNTER — Encounter: Payer: Self-pay | Admitting: Internal Medicine

## 2014-11-25 VITALS — BP 109/72 | HR 80 | Temp 98.2°F | Ht 61.0 in | Wt 171.2 lb

## 2014-11-25 DIAGNOSIS — Z800 Family history of malignant neoplasm of digestive organs: Secondary | ICD-10-CM | POA: Diagnosis not present

## 2014-11-25 DIAGNOSIS — M1990 Unspecified osteoarthritis, unspecified site: Secondary | ICD-10-CM

## 2014-11-25 DIAGNOSIS — Z0000 Encounter for general adult medical examination without abnormal findings: Secondary | ICD-10-CM

## 2014-11-25 DIAGNOSIS — E785 Hyperlipidemia, unspecified: Secondary | ICD-10-CM

## 2014-11-25 DIAGNOSIS — Z86010 Personal history of colonic polyps: Secondary | ICD-10-CM

## 2014-11-25 DIAGNOSIS — E669 Obesity, unspecified: Secondary | ICD-10-CM

## 2014-11-25 LAB — BASIC METABOLIC PANEL
BUN: 21 mg/dL (ref 6–23)
CO2: 27 mEq/L (ref 19–32)
Calcium: 9.4 mg/dL (ref 8.4–10.5)
Chloride: 104 mEq/L (ref 96–112)
Creatinine, Ser: 0.62 mg/dL (ref 0.40–1.20)
GFR: 101.08 mL/min (ref >60.00)
Glucose, Bld: 93 mg/dL (ref 70–99)
Potassium: 4.1 mEq/L (ref 3.5–5.1)
Sodium: 136 mEq/L (ref 135–145)

## 2014-11-25 LAB — LIPID PANEL
CHOL/HDL RATIO: 3
CHOLESTEROL: 154 mg/dL (ref 0–200)
HDL: 45.2 mg/dL (ref >39.00)
LDL CALC: 85 mg/dL (ref 0–99)
NonHDL: 108.8
Triglycerides: 120 mg/dL (ref 0.0–149.0)
VLDL: 24 mg/dL (ref 0.0–40.0)

## 2014-11-25 LAB — HEPATIC FUNCTION PANEL
ALT: 22 U/L (ref 0–35)
AST: 25 U/L (ref 0–37)
Albumin: 4 g/dL (ref 3.5–5.2)
Alkaline Phosphatase: 82 U/L (ref 39–117)
BILIRUBIN DIRECT: 0.1 mg/dL (ref 0.0–0.3)
BILIRUBIN TOTAL: 0.6 mg/dL (ref 0.2–1.2)
TOTAL PROTEIN: 7.3 g/dL (ref 6.0–8.3)

## 2014-11-25 NOTE — Progress Notes (Signed)
Pre visit review using our clinic review tool, if applicable. No additional management support is needed unless otherwise documented below in the visit note. 

## 2014-11-25 NOTE — Progress Notes (Signed)
[name], female   DOB: Oct 07, 1945, 69 y.o.   MRN: [id number]   Subjective:    [name], female    DOB: March 20, 1946, 69 y.o.   MRN: [id number]  HPI  Patient here for a scheduled follow up. Seeing Dr Jefm Bryant for her arthritis.  On MTX.  He is following labs.  Appears to be stable.  Tries to stay active.  No cardiac symptoms with increased activity or exertion.  Breathing stable.  Bowels stable.    Past Medical History  Diagnosis Date  . Blood in the stool     10 yrs ago  . History of chicken pox   . Diverticulitis   . Hypercholesterolemia   . History of colon polyps   . Seronegative rheumatoid arthritis   . History of abnormal Pap smear     Current Outpatient Prescriptions on File Prior to Visit  Medication Sig Dispense Refill  . aspirin 81 MG tablet Take 81 mg by mouth daily.      . calcium elemental as carbonate (TUMS ULTRA 1000) 400 MG tablet Chew 1,000 mg by mouth daily.      . Doxylamine Succinate, Sleep, (SLEEP AID PO) OTC as directed.     . folic acid (FOLVITE) 1 MG tablet Take 1 mg by mouth daily.      . meloxicam (MOBIC) 7.5 MG tablet Take 7.5 mg by mouth daily. with food     . methotrexate (RHEUMATREX) 10 MG tablet Take 10 mg by mouth once a week. Caution: Chemotherapy. Protect from light.    . Multiple Vitamins-Minerals (CENTRUM ULTRA WOMENS PO) Take by mouth.    . Omega-3 Fatty Acids (FISH OIL) 1000 MG CAPS Take 1,000 mg by mouth daily.      . simvastatin (ZOCOR) 20 MG tablet TAKE  ONE TABLET BY MOUTH EVERY EVENING 90 tablet 0  . traMADol (ULTRAM) 50 MG tablet Take 50 mg by mouth every 6 (six) hours as needed for pain.     No current facility-administered medications on file prior to visit.    Review of Systems  Constitutional: Negative for appetite change and unexpected weight change.  HENT: Negative for congestion and sinus pressure.   Respiratory: Negative for cough, chest tightness and shortness of breath.   Cardiovascular:  Negative for chest pain, palpitations and leg swelling.  Gastrointestinal: Negative for nausea, vomiting, abdominal pain and diarrhea.  Musculoskeletal:       Has arthritis.  Sees Dr Jefm Bryant.  Also sees Dr Sharlet Salina.    Skin: Negative for color change and rash.  Neurological: Negative for dizziness, light-headedness and headaches.  Psychiatric/Behavioral: Negative for dysphoric mood and agitation.       Objective:     Blood pressure recheck:  118/68  Physical Exam  Constitutional: She appears well-developed and well-nourished. No distress.  HENT:  Nose: Nose normal.  Mouth/Throat: Oropharynx is clear and moist.  Neck: Neck supple. No thyromegaly present.  Cardiovascular: Normal rate and regular rhythm.   Pulmonary/Chest: Breath sounds normal. No respiratory distress. She has no wheezes.  Abdominal: Soft. Bowel sounds are normal. There is no tenderness.  Musculoskeletal: She exhibits no edema or tenderness.  Lymphadenopathy:    She has no cervical adenopathy.  Skin: No rash noted. No erythema.  Psychiatric: She has a normal mood and affect. Her behavior is normal.    BP 109/72 mmHg  Pulse 80  Temp(Src) 98.2 F (36.8 C) (Oral)  Ht 5\' 1"  (1.549 m)  Wt  171 lb 4 oz (77.678 kg)  BMI 32.37 kg/m2  SpO2 94%  LMP 07/16/1996 Wt Readings from Last 3 Encounters:  11/25/14 171 lb 4 oz (77.678 kg)  05/27/14 167 lb 12 oz (76.091 kg)  08/10/13 177 lb (80.287 kg)     Lab Results  Component Value Date   WBC 6.9 03/25/2014   HGB 13.2 03/25/2014   HCT 40.4 03/25/2014   PLT 352.0 03/25/2014   GLUCOSE 93 11/25/2014   CHOL 154 11/25/2014   TRIG 120.0 11/25/2014   HDL 45.20 11/25/2014   LDLDIRECT 153.7 11/15/2010   LDLCALC 85 11/25/2014   ALT 22 11/25/2014   AST 25 11/25/2014   NA 136 11/25/2014   K 4.1 11/25/2014   CL 104 11/25/2014   CREATININE 0.62 11/25/2014   BUN 21 11/25/2014   CO2 27 11/25/2014   TSH 2.40 03/25/2014       Assessment & Plan:   Problem List Items  Addressed This Visit    Arthritis    Followed by Dr Jefm Bryant.  On MTX.  Also takes meloxicam.  Follow.       Family history of colon cancer - Primary    Colonoscopy 2010.  States not due colonoscopy until 2020.        Health care maintenance    Physical 05/27/14.  Mammogram 07/29/14 - Birads I.  Colonoscopy 2010.  Reports due f/u 2020.        History of colon polyps    Colonoscopy 2010.  States not due f/u until 2020.        Hyperlipidemia    Low cholesterol diet and exercise.  Follow lipid panel and liver function tests.  Continue simvastatin.       Obesity (BMI 30-39.9)    Diet and exercise.            Einar Pheasant, MD

## 2014-11-29 ENCOUNTER — Encounter: Payer: Self-pay | Admitting: Internal Medicine

## 2014-11-29 DIAGNOSIS — Z0000 Encounter for general adult medical examination without abnormal findings: Secondary | ICD-10-CM | POA: Insufficient documentation

## 2014-11-29 NOTE — Assessment & Plan Note (Signed)
Physical 05/27/14.  Mammogram 07/29/14 - Birads I.  Colonoscopy 2010.  Reports due f/u 2020.

## 2014-11-29 NOTE — Assessment & Plan Note (Addendum)
Low cholesterol diet and exercise.  Follow lipid panel and liver function tests.  Continue simvastatin.

## 2014-11-29 NOTE — Assessment & Plan Note (Signed)
Diet and exercise.   

## 2014-11-29 NOTE — Assessment & Plan Note (Signed)
Colonoscopy 2010.  States not due f/u until 2020.

## 2014-11-29 NOTE — Assessment & Plan Note (Signed)
Colonoscopy 2010.  States not due colonoscopy until 2020.

## 2014-11-29 NOTE — Assessment & Plan Note (Signed)
Followed by Dr Jefm Bryant.  On MTX.  Also takes meloxicam.  Follow.

## 2014-12-02 ENCOUNTER — Other Ambulatory Visit: Payer: Self-pay | Admitting: Internal Medicine

## 2015-01-26 DIAGNOSIS — R300 Dysuria: Secondary | ICD-10-CM | POA: Diagnosis not present

## 2015-01-26 DIAGNOSIS — R109 Unspecified abdominal pain: Secondary | ICD-10-CM | POA: Diagnosis not present

## 2015-02-07 DIAGNOSIS — G8929 Other chronic pain: Secondary | ICD-10-CM | POA: Diagnosis not present

## 2015-02-07 DIAGNOSIS — M1990 Unspecified osteoarthritis, unspecified site: Secondary | ICD-10-CM | POA: Diagnosis not present

## 2015-02-07 DIAGNOSIS — M150 Primary generalized (osteo)arthritis: Secondary | ICD-10-CM | POA: Diagnosis not present

## 2015-02-07 DIAGNOSIS — M25561 Pain in right knee: Secondary | ICD-10-CM | POA: Diagnosis not present

## 2015-03-07 DIAGNOSIS — M47816 Spondylosis without myelopathy or radiculopathy, lumbar region: Secondary | ICD-10-CM | POA: Diagnosis not present

## 2015-03-07 DIAGNOSIS — M5136 Other intervertebral disc degeneration, lumbar region: Secondary | ICD-10-CM | POA: Diagnosis not present

## 2015-05-03 DIAGNOSIS — G8929 Other chronic pain: Secondary | ICD-10-CM | POA: Diagnosis not present

## 2015-05-03 DIAGNOSIS — M25561 Pain in right knee: Secondary | ICD-10-CM | POA: Diagnosis not present

## 2015-05-03 DIAGNOSIS — M150 Primary generalized (osteo)arthritis: Secondary | ICD-10-CM | POA: Diagnosis not present

## 2015-05-03 DIAGNOSIS — M1990 Unspecified osteoarthritis, unspecified site: Secondary | ICD-10-CM | POA: Diagnosis not present

## 2015-05-10 DIAGNOSIS — M150 Primary generalized (osteo)arthritis: Secondary | ICD-10-CM | POA: Diagnosis not present

## 2015-05-10 DIAGNOSIS — G8929 Other chronic pain: Secondary | ICD-10-CM | POA: Diagnosis not present

## 2015-05-10 DIAGNOSIS — M7061 Trochanteric bursitis, right hip: Secondary | ICD-10-CM | POA: Diagnosis not present

## 2015-05-10 DIAGNOSIS — M1990 Unspecified osteoarthritis, unspecified site: Secondary | ICD-10-CM | POA: Diagnosis not present

## 2015-05-10 DIAGNOSIS — M25561 Pain in right knee: Secondary | ICD-10-CM | POA: Diagnosis not present

## 2015-05-19 ENCOUNTER — Other Ambulatory Visit: Payer: Self-pay | Admitting: Internal Medicine

## 2015-05-25 DIAGNOSIS — M1711 Unilateral primary osteoarthritis, right knee: Secondary | ICD-10-CM | POA: Diagnosis not present

## 2015-06-01 ENCOUNTER — Encounter: Payer: Self-pay | Admitting: Internal Medicine

## 2015-06-01 ENCOUNTER — Ambulatory Visit (INDEPENDENT_AMBULATORY_CARE_PROVIDER_SITE_OTHER): Payer: Medicare Other | Admitting: Internal Medicine

## 2015-06-01 VITALS — BP 120/80 | HR 82 | Temp 98.4°F | Resp 18 | Ht 60.75 in | Wt 171.0 lb

## 2015-06-01 DIAGNOSIS — E785 Hyperlipidemia, unspecified: Secondary | ICD-10-CM

## 2015-06-01 DIAGNOSIS — Z23 Encounter for immunization: Secondary | ICD-10-CM | POA: Diagnosis not present

## 2015-06-01 DIAGNOSIS — Z1239 Encounter for other screening for malignant neoplasm of breast: Secondary | ICD-10-CM

## 2015-06-01 DIAGNOSIS — Z800 Family history of malignant neoplasm of digestive organs: Secondary | ICD-10-CM | POA: Diagnosis not present

## 2015-06-01 DIAGNOSIS — M1990 Unspecified osteoarthritis, unspecified site: Secondary | ICD-10-CM

## 2015-06-01 DIAGNOSIS — M25552 Pain in left hip: Secondary | ICD-10-CM

## 2015-06-01 LAB — CBC WITH DIFFERENTIAL/PLATELET
BASOS PCT: 0.6 % (ref 0.0–3.0)
Basophils Absolute: 0 10*3/uL (ref 0.0–0.1)
EOS ABS: 0.2 10*3/uL (ref 0.0–0.7)
Eosinophils Relative: 3.5 % (ref 0.0–5.0)
HCT: 41.9 % (ref 36.0–46.0)
Hemoglobin: 13.5 g/dL (ref 12.0–15.0)
Lymphocytes Relative: 26.2 % (ref 12.0–46.0)
Lymphs Abs: 1.5 10*3/uL (ref 0.7–4.0)
MCHC: 32.2 g/dL (ref 30.0–36.0)
MCV: 86.8 fl (ref 78.0–100.0)
MONO ABS: 0.5 10*3/uL (ref 0.1–1.0)
Monocytes Relative: 9 % (ref 3.0–12.0)
NEUTROS ABS: 3.6 10*3/uL (ref 1.4–7.7)
Neutrophils Relative %: 60.7 % (ref 43.0–77.0)
PLATELETS: 387 10*3/uL (ref 150.0–400.0)
RBC: 4.84 Mil/uL (ref 3.87–5.11)
RDW: 16.3 % — AB (ref 11.5–15.5)
WBC: 5.9 10*3/uL (ref 4.0–10.5)

## 2015-06-01 LAB — COMPREHENSIVE METABOLIC PANEL
ALK PHOS: 72 U/L (ref 39–117)
ALT: 21 U/L (ref 0–35)
AST: 20 U/L (ref 0–37)
Albumin: 4 g/dL (ref 3.5–5.2)
BILIRUBIN TOTAL: 0.4 mg/dL (ref 0.2–1.2)
BUN: 24 mg/dL — ABNORMAL HIGH (ref 6–23)
CALCIUM: 9.6 mg/dL (ref 8.4–10.5)
CO2: 29 meq/L (ref 19–32)
Chloride: 104 mEq/L (ref 96–112)
Creatinine, Ser: 0.69 mg/dL (ref 0.40–1.20)
GFR: 89.21 mL/min (ref >60.00)
Glucose, Bld: 89 mg/dL (ref 70–99)
POTASSIUM: 3.8 meq/L (ref 3.5–5.1)
Sodium: 139 mEq/L (ref 135–145)
Total Protein: 7 g/dL (ref 6.0–8.3)

## 2015-06-01 LAB — LIPID PANEL
CHOLESTEROL: 146 mg/dL (ref 0–200)
HDL: 49.5 mg/dL (ref >39.00)
LDL CALC: 77 mg/dL (ref 0–99)
NonHDL: 96.1
TRIGLYCERIDES: 96 mg/dL (ref 0.0–149.0)
Total CHOL/HDL Ratio: 3
VLDL: 19.2 mg/dL (ref 0.0–40.0)

## 2015-06-01 LAB — TSH: TSH: 1.55 u[IU]/mL (ref 0.35–4.50)

## 2015-06-01 NOTE — Progress Notes (Signed)
[name], female   DOB: January 12, 1946, 69 y.o.   MRN: [id number]   Subjective:    [name], female    DOB: 05/24/1946, 69 y.o.   MRN: [id number]  HPI  Patient with past history of hypercholesterolemia and RA who comes in today to follow up on these issues.  She stays active.  Exercises two times per week.  No cardiac symptoms with increased activity or exertion.  No sob.  No acid reflux reported.  No abdominal pain or cramping.  Bowels stable.  Had some knee pain.  Saw Dr Sabra Heck.  Resolved with an injection.  Has left hip pain.  Saw Dr Jefm Bryant.  Diagnosed with bursitis.  S/p injection.  No significant change.  Does not keep her from exercising.  Desires no further intervention at this time.  Off MTX.  Joints (ankle) stable.     Past Medical History  Diagnosis Date  . Blood in the stool     10 yrs ago  . History of chicken pox   . Diverticulitis   . Hypercholesterolemia   . History of colon polyps   . Seronegative rheumatoid arthritis ([place])   . History of abnormal Pap smear    Past Surgical History  Procedure Laterality Date  . Leg / ankle soft tissue biopsy  2011    to confirm arthritis  . Dilation and curettage of uterus     Family History  Problem Relation Age of Onset  . Arthritis Mother   . Heart disease Mother   . Hyperlipidemia Mother   . Hypertension Mother   . Arthritis Father   . Hyperlipidemia Father   . Heart disease Father   . Hypertension Father   . Diabetes Father   . Pulmonary fibrosis Father   . Breast cancer Maternal Aunt   . Colon cancer Maternal Aunt   . Colon cancer Maternal Uncle    Social History   Social History  . Marital Status: Divorced    Spouse Name: N/A  . Number of Children: N/A  . Years of Education: N/A   Social History Main Topics  . Smoking status: Never Smoker   . Smokeless tobacco: Never Used  . Alcohol Use: No  . Drug Use: No  . Sexual Activity: Not Asked   Other Topics Concern  . None    Social History Narrative    Outpatient Encounter Prescriptions as of 06/01/2015  Medication Sig  . aspirin 81 MG tablet Take 81 mg by mouth daily.    . calcium elemental as carbonate (TUMS ULTRA 1000) 400 MG tablet Chew 1,000 mg by mouth daily.    . Doxylamine Succinate, Sleep, (SLEEP AID PO) OTC as directed.   . folic acid (FOLVITE) 1 MG tablet Take 1 mg by mouth daily.    . meloxicam (MOBIC) 15 MG tablet Take 15 mg by mouth daily.  . Multiple Vitamins-Minerals (CENTRUM ULTRA WOMENS PO) Take by mouth.  . Omega-3 Fatty Acids (FISH OIL) 1000 MG CAPS Take 1,000 mg by mouth daily.    . simvastatin (ZOCOR) 20 MG tablet Take 1 tablet by mouth  every evening  . traMADol (ULTRAM) 50 MG tablet Take 50 mg by mouth every 6 (six) hours as needed for pain.  . [DISCONTINUED] meloxicam (MOBIC) 7.5 MG tablet Take 7.5 mg by mouth daily. with food   . [DISCONTINUED] methotrexate (RHEUMATREX) 10 MG tablet Take 10 mg by mouth once a week. Caution: Chemotherapy. Protect from light.  No facility-administered encounter medications on file as of 06/01/2015.    Review of Systems  Constitutional: Negative for appetite change and unexpected weight change.  HENT: Negative for congestion and sinus pressure.   Eyes: Negative for pain and discharge.  Respiratory: Negative for cough, chest tightness and shortness of breath.   Cardiovascular: Negative for chest pain, palpitations and leg swelling.  Gastrointestinal: Negative for nausea, vomiting, abdominal pain and diarrhea.  Genitourinary: Negative for dysuria and difficulty urinating.  Musculoskeletal: Negative for back pain and joint swelling.       Still with left hip pain.  Does not limit her exercise.   Skin: Negative for color change and rash.  Neurological: Negative for dizziness, light-headedness and headaches.  Psychiatric/Behavioral: Negative for dysphoric mood and agitation.       Objective:    Physical Exam  Constitutional: She appears  well-developed and well-nourished. No distress.  HENT:  Nose: Nose normal.  Mouth/Throat: Oropharynx is clear and moist.  Eyes: Conjunctivae are normal. Right eye exhibits no discharge. Left eye exhibits no discharge.  Neck: Neck supple. No thyromegaly present.  Cardiovascular: Normal rate and regular rhythm.   Pulmonary/Chest: Breath sounds normal. No respiratory distress. She has no wheezes.  Abdominal: Soft. Bowel sounds are normal. There is no tenderness.  Musculoskeletal: She exhibits no edema or tenderness.  Lymphadenopathy:    She has no cervical adenopathy.  Skin: No rash noted. No erythema.  Psychiatric: She has a normal mood and affect. Her behavior is normal.    BP 120/80 mmHg  Pulse 82  Temp(Src) 98.4 F (36.9 C) (Oral)  Resp 18  Ht 5' 0.75" (1.543 m)  Wt 171 lb (77.565 kg)  BMI 32.58 kg/m2  SpO2 93%  LMP 07/16/1996 Wt Readings from Last 3 Encounters:  06/01/15 171 lb (77.565 kg)  11/25/14 171 lb 4 oz (77.678 kg)  05/27/14 167 lb 12 oz (76.091 kg)     Lab Results  Component Value Date   WBC 5.9 06/01/2015   HGB 13.5 06/01/2015   HCT 41.9 06/01/2015   PLT 387.0 06/01/2015   GLUCOSE 89 06/01/2015   CHOL 146 06/01/2015   TRIG 96.0 06/01/2015   HDL 49.50 06/01/2015   LDLDIRECT 153.7 11/15/2010   LDLCALC 77 06/01/2015   ALT 21 06/01/2015   AST 20 06/01/2015   NA 139 06/01/2015   K 3.8 06/01/2015   CL 104 06/01/2015   CREATININE 0.69 06/01/2015   BUN 24* 06/01/2015   CO2 29 06/01/2015   TSH 1.55 06/01/2015       Assessment & Plan:   Problem List Items Addressed This Visit    Arthritis    Off MTX.  Ankles doing well.  Followed by Dr Jefm Bryant.        Relevant Medications   meloxicam (MOBIC) 15 MG tablet   Breast cancer screening - Primary    Schedule mammogram.        Relevant Orders   MM DIGITAL SCREENING BILATERAL   Family history of colon cancer    Colonoscopy 2010.  States not due until 2020.        Hyperlipidemia    Low cholesterol  diet and exercise.  On simvastatin.  Follow lipid panel and liver function tests.        Relevant Orders   CBC with Differential/Platelet (Completed)   Comprehensive metabolic panel (Completed)   TSH (Completed)   Lipid panel (Completed)   Left hip pain    Persistent left hip pain.  Saw  Dr Jefm Bryant.  S/p injection.  Desires no further intervention at this time.  Follow.  Does not limit her exercise.            Einar Pheasant, MD

## 2015-06-01 NOTE — Progress Notes (Signed)
Pre-visit discussion using our clinic review tool. No additional management support is needed unless otherwise documented below in the visit note.  

## 2015-06-02 ENCOUNTER — Encounter: Payer: Self-pay | Admitting: Internal Medicine

## 2015-06-03 NOTE — Telephone Encounter (Signed)
Unread mychart message mailed to patient 

## 2015-06-11 ENCOUNTER — Encounter: Payer: Self-pay | Admitting: Internal Medicine

## 2015-06-11 DIAGNOSIS — M25552 Pain in left hip: Secondary | ICD-10-CM | POA: Insufficient documentation

## 2015-06-11 NOTE — Assessment & Plan Note (Signed)
Low cholesterol diet and exercise.  On simvastatin.  Follow lipid panel and liver function tests.   

## 2015-06-11 NOTE — Assessment & Plan Note (Signed)
Colonoscopy 2010.  States not due until 2020.

## 2015-06-11 NOTE — Assessment & Plan Note (Signed)
Schedule mammogram.

## 2015-06-11 NOTE — Assessment & Plan Note (Signed)
Persistent left hip pain.  Saw Dr Jefm Bryant.  S/p injection.  Desires no further intervention at this time.  Follow.  Does not limit her exercise.

## 2015-06-11 NOTE — Assessment & Plan Note (Signed)
Off MTX.  Ankles doing well.  Followed by Dr Jefm Bryant.

## 2015-07-19 DIAGNOSIS — M7062 Trochanteric bursitis, left hip: Secondary | ICD-10-CM | POA: Diagnosis not present

## 2015-08-10 ENCOUNTER — Telehealth: Payer: Self-pay | Admitting: *Deleted

## 2015-08-10 MED ORDER — TAVABOROLE 5 % EX SOLN: 1.0000 [drp] | Freq: Every day | CUTANEOUS | Status: DC

## 2015-08-10 NOTE — Telephone Encounter (Signed)
Fax request for refill Caitlin Bennett from [facility].  Dr. Milinda Pointer states refill for 1 year, but inform pt, her insurance may not cover if there is no + fungal culture.  Left message with Orders to pt and to [facility].

## 2015-08-24 ENCOUNTER — Telehealth: Payer: Self-pay

## 2015-08-24 NOTE — Telephone Encounter (Signed)
I recommend stopping the medication.  See if symptoms completely resolve.  Call with update over the next few weeks.  Then can decide about other cholesterol treatment.

## 2015-08-24 NOTE — Telephone Encounter (Signed)
Pt notified by VM 

## 2015-08-24 NOTE — Telephone Encounter (Signed)
Pt states that she is starting to have a reaction to the Zocor. Pt states that she wakes up in the morning and is expeirencing muscle aches and joint pain x several months. Pt wants to know if she can stop taking or suggestion of a different medication. Please advise, thanks

## 2015-08-30 ENCOUNTER — Other Ambulatory Visit: Payer: Self-pay | Admitting: Internal Medicine

## 2015-08-30 ENCOUNTER — Ambulatory Visit
Admission: RE | Admit: 2015-08-30 | Discharge: 2015-08-30 | Disposition: A | Discharge status: Home / Self Care | Payer: Medicare Other | Source: Ambulatory Visit | Attending: Internal Medicine | Admitting: Internal Medicine

## 2015-08-30 DIAGNOSIS — Z1231 Encounter for screening mammogram for malignant neoplasm of breast: Secondary | ICD-10-CM | POA: Insufficient documentation

## 2015-08-30 DIAGNOSIS — Z1239 Encounter for other screening for malignant neoplasm of breast: Secondary | ICD-10-CM

## 2015-09-12 DIAGNOSIS — H2513 Age-related nuclear cataract, bilateral: Secondary | ICD-10-CM | POA: Diagnosis not present

## 2015-09-30 DIAGNOSIS — M47816 Spondylosis without myelopathy or radiculopathy, lumbar region: Secondary | ICD-10-CM | POA: Diagnosis not present

## 2015-09-30 DIAGNOSIS — M1711 Unilateral primary osteoarthritis, right knee: Secondary | ICD-10-CM | POA: Diagnosis not present

## 2015-09-30 DIAGNOSIS — M25561 Pain in right knee: Secondary | ICD-10-CM | POA: Diagnosis not present

## 2015-09-30 DIAGNOSIS — M25552 Pain in left hip: Secondary | ICD-10-CM | POA: Diagnosis not present

## 2015-09-30 DIAGNOSIS — M7062 Trochanteric bursitis, left hip: Secondary | ICD-10-CM | POA: Diagnosis not present

## 2015-10-12 DIAGNOSIS — D229 Melanocytic nevi, unspecified: Secondary | ICD-10-CM | POA: Diagnosis not present

## 2015-10-12 DIAGNOSIS — L821 Other seborrheic keratosis: Secondary | ICD-10-CM | POA: Diagnosis not present

## 2015-10-12 DIAGNOSIS — L820 Inflamed seborrheic keratosis: Secondary | ICD-10-CM | POA: Diagnosis not present

## 2015-10-12 DIAGNOSIS — Z85828 Personal history of other malignant neoplasm of skin: Secondary | ICD-10-CM | POA: Diagnosis not present

## 2015-10-25 DIAGNOSIS — M170 Bilateral primary osteoarthritis of knee: Secondary | ICD-10-CM | POA: Insufficient documentation

## 2015-10-25 DIAGNOSIS — M1712 Unilateral primary osteoarthritis, left knee: Secondary | ICD-10-CM | POA: Insufficient documentation

## 2015-10-27 ENCOUNTER — Other Ambulatory Visit: Payer: Self-pay | Admitting: Internal Medicine

## 2015-10-27 NOTE — Telephone Encounter (Signed)
Rx refill sent to pharmacy. 

## 2015-11-01 DIAGNOSIS — M170 Bilateral primary osteoarthritis of knee: Secondary | ICD-10-CM | POA: Diagnosis not present

## 2015-11-08 DIAGNOSIS — M170 Bilateral primary osteoarthritis of knee: Secondary | ICD-10-CM | POA: Diagnosis not present

## 2015-12-22 ENCOUNTER — Encounter: Payer: Self-pay | Admitting: Internal Medicine

## 2015-12-22 ENCOUNTER — Ambulatory Visit (INDEPENDENT_AMBULATORY_CARE_PROVIDER_SITE_OTHER): Payer: Medicare Other | Admitting: Internal Medicine

## 2015-12-22 VITALS — BP 124/80 | HR 87 | Temp 98.1°F | Resp 18 | Ht 60.75 in | Wt 175.0 lb

## 2015-12-22 DIAGNOSIS — Z1239 Encounter for other screening for malignant neoplasm of breast: Secondary | ICD-10-CM

## 2015-12-22 DIAGNOSIS — Z86010 Personal history of colonic polyps: Secondary | ICD-10-CM | POA: Diagnosis not present

## 2015-12-22 DIAGNOSIS — N3281 Overactive bladder: Secondary | ICD-10-CM

## 2015-12-22 DIAGNOSIS — E669 Obesity, unspecified: Secondary | ICD-10-CM

## 2015-12-22 DIAGNOSIS — E785 Hyperlipidemia, unspecified: Secondary | ICD-10-CM | POA: Diagnosis not present

## 2015-12-22 DIAGNOSIS — M1990 Unspecified osteoarthritis, unspecified site: Secondary | ICD-10-CM | POA: Diagnosis not present

## 2015-12-22 LAB — LIPID PANEL
CHOLESTEROL: 147 mg/dL (ref 0–200)
HDL: 39.8 mg/dL (ref >39.00)
LDL CALC: 80 mg/dL (ref 0–99)
NonHDL: 107.08
TRIGLYCERIDES: 136 mg/dL (ref 0.0–149.0)
Total CHOL/HDL Ratio: 4
VLDL: 27.2 mg/dL (ref 0.0–40.0)

## 2015-12-22 LAB — COMPREHENSIVE METABOLIC PANEL
ALBUMIN: 4.3 g/dL (ref 3.5–5.2)
ALT: 18 U/L (ref 0–35)
AST: 21 U/L (ref 0–37)
Alkaline Phosphatase: 83 U/L (ref 39–117)
BUN: 26 mg/dL — ABNORMAL HIGH (ref 6–23)
CALCIUM: 9.4 mg/dL (ref 8.4–10.5)
CHLORIDE: 106 meq/L (ref 96–112)
CO2: 26 mEq/L (ref 19–32)
Creatinine, Ser: 0.62 mg/dL (ref 0.40–1.20)
GFR: 100.77 mL/min (ref >60.00)
Glucose, Bld: 102 mg/dL — ABNORMAL HIGH (ref 70–99)
POTASSIUM: 4.4 meq/L (ref 3.5–5.1)
Sodium: 138 mEq/L (ref 135–145)
Total Bilirubin: 0.4 mg/dL (ref 0.2–1.2)
Total Protein: 7 g/dL (ref 6.0–8.3)

## 2015-12-22 NOTE — Progress Notes (Signed)
[name], female   DOB: May 14, 1946, 70 y.o.   MRN: [id number]   Subjective:    [name], female    DOB: [birth year], 69 y.o.   MRN: [id number]  HPI  Patient here for a scheduled follow up.   She is staying active.  No cardiac symptoms with increased activity or exertion.  No sob.  No acid reflux.  No abdominal pain or cramping.  Bowels stable.  Noticed a "lump" on her left wrist. No pain.  Reports still needing to get up through the night to urinate.  On the long acting bladder medication, states has some minimal hesitancy.  Felt may have done better on the short acting bladder medication.  Some arthritis in her ankles.  Sitting worse.  Moving better.     Past Medical History  Diagnosis Date  . Blood in the stool     10 yrs ago  . History of chicken pox   . Diverticulitis   . Hypercholesterolemia   . History of colon polyps   . Seronegative rheumatoid arthritis ([place])   . History of abnormal Pap smear    Past Surgical History  Procedure Laterality Date  . Leg / ankle soft tissue biopsy  2011    to confirm arthritis  . Dilation and curettage of uterus     Family History  Problem Relation Age of Onset  . Arthritis Mother   . Heart disease Mother   . Hyperlipidemia Mother   . Hypertension Mother   . Arthritis Father   . Hyperlipidemia Father   . Heart disease Father   . Hypertension Father   . Diabetes Father   . Pulmonary fibrosis Father   . Colon cancer Maternal Aunt   . Colon cancer Maternal Uncle    Social History   Social History  . Marital Status: Divorced    Spouse Name: N/A  . Number of Children: N/A  . Years of Education: N/A   Social History Main Topics  . Smoking status: Never Smoker   . Smokeless tobacco: Never Used  . Alcohol Use: No  . Drug Use: No  . Sexual Activity: Not Asked   Other Topics Concern  . None   Social History Narrative    Outpatient Encounter Prescriptions as of 12/22/2015  Medication Sig  . aspirin  81 MG tablet Take 81 mg by mouth daily.    . calcium elemental as carbonate (TUMS ULTRA 1000) 400 MG tablet Chew 1,000 mg by mouth daily.    . Doxylamine Succinate, Sleep, (SLEEP AID PO) OTC as directed.   . folic acid (FOLVITE) 1 MG tablet Take 1 mg by mouth daily.    . meloxicam (MOBIC) 15 MG tablet Take 15 mg by mouth daily.  . Multiple Vitamins-Minerals (CENTRUM ULTRA WOMENS PO) Take by mouth.  . Omega-3 Fatty Acids (FISH OIL) 1000 MG CAPS Take 1,000 mg by mouth daily.    . simvastatin (ZOCOR) 20 MG tablet Take 1 tablet by mouth  every evening  . Tavaborole (KERYDIN) 5 % SOLN Apply 1 drop topically daily.  . traMADol (ULTRAM) 50 MG tablet Take 50 mg by mouth every 6 (six) hours as needed for pain.   No facility-administered encounter medications on file as of 12/22/2015.    Review of Systems  Constitutional: Negative for appetite change and unexpected weight change.  HENT: Negative for congestion and sinus pressure.   Respiratory: Negative for cough, chest tightness and shortness of breath.  Cardiovascular: Negative for chest pain, palpitations and leg swelling.  Gastrointestinal: Negative for nausea, vomiting, abdominal pain and diarrhea.  Genitourinary: Negative for dysuria.       Some minimal urinary hesitancy.  Some nocturia.   Musculoskeletal: Negative for myalgias.       Some arthritis.  Walking and moving better.   Skin: Negative for color change and rash.  Neurological: Negative for dizziness, light-headedness and headaches.  Psychiatric/Behavioral: Negative for dysphoric mood and agitation.       Objective:    Physical Exam  Constitutional: She appears well-developed and well-nourished. No distress.  HENT:  Nose: Nose normal.  Mouth/Throat: Oropharynx is clear and moist.  Neck: Neck supple. No thyromegaly present.  Cardiovascular: Normal rate and regular rhythm.   Pulmonary/Chest: Breath sounds normal. No respiratory distress. She has no wheezes.  Abdominal: Soft.  Bowel sounds are normal. There is no tenderness.  Musculoskeletal: She exhibits no edema or tenderness.  Ganglion cyst - left wrist.  Non tender.    Lymphadenopathy:    She has no cervical adenopathy.  Skin: No rash noted. No erythema.  Psychiatric: She has a normal mood and affect. Her behavior is normal.    BP 124/80 mmHg  Pulse 87  Temp(Src) 98.1 F (36.7 C) (Oral)  Resp 18  Ht 5' 0.75" (1.543 m)  Wt 175 lb (79.379 kg)  BMI 33.34 kg/m2  SpO2 96%  LMP 07/16/1996 Wt Readings from Last 3 Encounters:  12/22/15 175 lb (79.379 kg)  06/01/15 171 lb (77.565 kg)  11/25/14 171 lb 4 oz (77.678 kg)     Lab Results  Component Value Date   WBC 5.9 06/01/2015   HGB 13.5 06/01/2015   HCT 41.9 06/01/2015   PLT 387.0 06/01/2015   GLUCOSE 102* 12/22/2015   CHOL 147 12/22/2015   TRIG 136.0 12/22/2015   HDL 39.80 12/22/2015   LDLDIRECT 153.7 11/15/2010   LDLCALC 80 12/22/2015   ALT 18 12/22/2015   AST 21 12/22/2015   NA 138 12/22/2015   K 4.4 12/22/2015   CL 106 12/22/2015   CREATININE 0.62 12/22/2015   BUN 26* 12/22/2015   CO2 26 12/22/2015   TSH 1.55 06/01/2015    Mm Screening Breast Tomo Bilateral  08/31/2015  CLINICAL DATA:  Screening. EXAM: DIGITAL SCREENING BILATERAL MAMMOGRAM WITH 3D TOMO WITH CAD COMPARISON:  Previous exam(s). ACR Breast Density Category b: There are scattered areas of fibroglandular density. FINDINGS: There are no findings suspicious for malignancy. Images were processed with CAD. IMPRESSION: No mammographic evidence of malignancy. A result letter of this screening mammogram will be mailed directly to the patient. RECOMMENDATION: Screening mammogram in one year. (Code:SM-B-01Y) BI-RADS CATEGORY  1: Negative. Electronically Signed   By: Fidela Salisbury M.D.   On: 08/31/2015 08:18       Assessment & Plan:   Problem List Items Addressed This Visit    Arthritis    Has seen Dr Jefm Bryant.  Off MTX.  Doing well.  Follow.       Breast cancer screening      Mammogram 08/31/15 - Birads I.       History of colon polyps    Colonoscopy 2010.  Due f/u 2020.       Hyperlipidemia - Primary    Low cholesterol diet and exercise.  Follow lipid panel.       Relevant Orders   Lipid panel (Completed)   Comprehensive metabolic panel (Completed)   Obesity (BMI 30-39.9)    Diet and exercise.  Overactive bladder    Symptoms as outlined.  Will change back to detrol 2mg  and just have her take one in the am.  See if this improves her nighttime symptoms.  Follow.           Einar Pheasant, MD

## 2015-12-22 NOTE — Progress Notes (Signed)
Pre-visit discussion using our clinic review tool. No additional management support is needed unless otherwise documented below in the visit note.  

## 2015-12-23 ENCOUNTER — Encounter: Payer: Self-pay | Admitting: Internal Medicine

## 2015-12-24 ENCOUNTER — Encounter: Payer: Self-pay | Admitting: Internal Medicine

## 2015-12-24 NOTE — Assessment & Plan Note (Signed)
Diet and exercise.   

## 2015-12-24 NOTE — Assessment & Plan Note (Signed)
Colonoscopy 2010.  Due f/u 2020.

## 2015-12-24 NOTE — Assessment & Plan Note (Signed)
Mammogram 08/31/15 - Birads I.

## 2015-12-24 NOTE — Assessment & Plan Note (Signed)
Symptoms as outlined.  Will change back to detrol 2mg  and just have her take one in the am.  See if this improves her nighttime symptoms.  Follow.

## 2015-12-24 NOTE — Assessment & Plan Note (Signed)
Has seen Dr Jefm Bryant.  Off MTX.  Doing well.  Follow.

## 2015-12-24 NOTE — Assessment & Plan Note (Signed)
Low cholesterol diet and exercise.  Follow lipid panel.   

## 2016-02-02 ENCOUNTER — Encounter: Payer: Self-pay | Admitting: Internal Medicine

## 2016-02-07 MED ORDER — OXYBUTYNIN CHLORIDE 5 MG PO TABS: 5.0000 mg | ORAL_TABLET | Freq: Every day | ORAL | 1 refills | Status: DC

## 2016-02-07 NOTE — Telephone Encounter (Signed)
rx sent in for oxybutynin 5mg  #90 with one refill.  Pt notified via my chart.

## 2016-02-27 DIAGNOSIS — M5136 Other intervertebral disc degeneration, lumbar region: Secondary | ICD-10-CM | POA: Diagnosis not present

## 2016-02-27 DIAGNOSIS — M47816 Spondylosis without myelopathy or radiculopathy, lumbar region: Secondary | ICD-10-CM | POA: Diagnosis not present

## 2016-04-17 ENCOUNTER — Ambulatory Visit (INDEPENDENT_AMBULATORY_CARE_PROVIDER_SITE_OTHER): Payer: Medicare Other

## 2016-04-17 DIAGNOSIS — Z23 Encounter for immunization: Secondary | ICD-10-CM

## 2016-04-17 NOTE — Progress Notes (Signed)
Patient received Flu shot.

## 2016-06-18 ENCOUNTER — Other Ambulatory Visit: Payer: Self-pay | Admitting: Internal Medicine

## 2016-06-21 ENCOUNTER — Ambulatory Visit (INDEPENDENT_AMBULATORY_CARE_PROVIDER_SITE_OTHER): Payer: Medicare Other | Admitting: Internal Medicine

## 2016-06-21 ENCOUNTER — Encounter: Payer: Self-pay | Admitting: Internal Medicine

## 2016-06-21 VITALS — BP 130/82 | HR 92 | Ht 60.5 in | Wt 176.0 lb

## 2016-06-21 DIAGNOSIS — Z0000 Encounter for general adult medical examination without abnormal findings: Secondary | ICD-10-CM | POA: Diagnosis not present

## 2016-06-21 DIAGNOSIS — M25562 Pain in left knee: Secondary | ICD-10-CM | POA: Insufficient documentation

## 2016-06-21 DIAGNOSIS — E669 Obesity, unspecified: Secondary | ICD-10-CM | POA: Diagnosis not present

## 2016-06-21 DIAGNOSIS — M25561 Pain in right knee: Secondary | ICD-10-CM

## 2016-06-21 DIAGNOSIS — R238 Other skin changes: Secondary | ICD-10-CM

## 2016-06-21 DIAGNOSIS — Z86010 Personal history of colonic polyps: Secondary | ICD-10-CM

## 2016-06-21 DIAGNOSIS — E785 Hyperlipidemia, unspecified: Secondary | ICD-10-CM | POA: Diagnosis not present

## 2016-06-21 DIAGNOSIS — R233 Spontaneous ecchymoses: Secondary | ICD-10-CM

## 2016-06-21 LAB — CBC WITH DIFFERENTIAL/PLATELET
BASOS ABS: 0.1 10*3/uL (ref 0.0–0.1)
Basophils Relative: 1.2 % (ref 0.0–3.0)
Eosinophils Absolute: 0.2 10*3/uL (ref 0.0–0.7)
Eosinophils Relative: 3.1 % (ref 0.0–5.0)
HCT: 39.7 % (ref 36.0–46.0)
HEMOGLOBIN: 12.9 g/dL (ref 12.0–15.0)
LYMPHS ABS: 1.5 10*3/uL (ref 0.7–4.0)
Lymphocytes Relative: 23.6 % (ref 12.0–46.0)
MCHC: 32.4 g/dL (ref 30.0–36.0)
MCV: 70.7 fl — AB (ref 78.0–100.0)
MONOS PCT: 8.8 % (ref 3.0–12.0)
Monocytes Absolute: 0.6 10*3/uL (ref 0.1–1.0)
NEUTROS PCT: 63.3 % (ref 43.0–77.0)
Neutro Abs: 4.1 10*3/uL (ref 1.4–7.7)
Platelets: 438 10*3/uL — ABNORMAL HIGH (ref 150.0–400.0)
RBC: 5.61 Mil/uL — AB (ref 3.87–5.11)
RDW: 22.8 % — ABNORMAL HIGH (ref 11.5–15.5)
WBC: 6.5 10*3/uL (ref 4.0–10.5)

## 2016-06-21 LAB — TSH: TSH: 2.7 u[IU]/mL (ref 0.35–4.50)

## 2016-06-21 LAB — LIPID PANEL
Cholesterol: 144 mg/dL (ref 0–200)
HDL: 44.3 mg/dL (ref >39.00)
LDL CALC: 78 mg/dL (ref 0–99)
NONHDL: 99.84
Total CHOL/HDL Ratio: 3
Triglycerides: 108 mg/dL (ref 0.0–149.0)
VLDL: 21.6 mg/dL (ref 0.0–40.0)

## 2016-06-21 LAB — BASIC METABOLIC PANEL
BUN: 20 mg/dL (ref 6–23)
CALCIUM: 9.2 mg/dL (ref 8.4–10.5)
CO2: 26 mEq/L (ref 19–32)
Chloride: 105 mEq/L (ref 96–112)
Creatinine, Ser: 0.68 mg/dL (ref 0.40–1.20)
GFR: 90.45 mL/min (ref >60.00)
GLUCOSE: 114 mg/dL — AB (ref 70–99)
Potassium: 4.2 mEq/L (ref 3.5–5.1)
Sodium: 139 mEq/L (ref 135–145)

## 2016-06-21 LAB — HEPATIC FUNCTION PANEL
ALBUMIN: 4.2 g/dL (ref 3.5–5.2)
ALK PHOS: 88 U/L (ref 39–117)
ALT: 24 U/L (ref 0–35)
AST: 24 U/L (ref 0–37)
BILIRUBIN TOTAL: 0.4 mg/dL (ref 0.2–1.2)
Bilirubin, Direct: 0.1 mg/dL (ref 0.0–0.3)
Total Protein: 7.2 g/dL (ref 6.0–8.3)

## 2016-06-21 LAB — PROTIME-INR
INR: 1.1 ratio — ABNORMAL HIGH (ref 0.8–1.0)
Prothrombin Time: 11.1 s (ref 9.6–13.1)

## 2016-06-21 MED ORDER — NYSTATIN 100000 UNIT/GM EX CREA: 1.0000 "application " | TOPICAL_CREAM | Freq: Two times a day (BID) | CUTANEOUS | 0 refills | Status: DC

## 2016-06-21 MED ORDER — PANTOPRAZOLE SODIUM 40 MG PO TBEC: 40.0000 mg | DELAYED_RELEASE_TABLET | Freq: Every day | ORAL | 1 refills | Status: DC

## 2016-06-21 MED ORDER — PANTOPRAZOLE SODIUM 40 MG PO TBEC: 40.0000 mg | DELAYED_RELEASE_TABLET | Freq: Every day | ORAL | 0 refills | Status: DC

## 2016-06-21 NOTE — Assessment & Plan Note (Signed)
Physical today 06/21/16.  Mammogram 08/31/15 - Birads I.  Colonoscopy 2010.  Due f/u in 2020.

## 2016-06-21 NOTE — Progress Notes (Signed)
[name], female   DOB: [birth year], 70 y.o.   MRN: [id number]   Subjective:    [name], female    DOB: [birth year], 70 y.o.   MRN: [id number]  HPI  Patient here for her physical exam.  She still has increased knee pain.  Received injections.  Did not help a lot.  Not ready for surgery.  Seeing Dr Sharlet Salina for her back.  Taking tramadol.  She takes meloxicam daily.  Has cut down recently.  Started having increased acid reflux.  Discussed treatment.  Eating.  No nausea or vomiting.  Bowels stable.  Has noticed easy bruising.     Past Medical History:  Diagnosis Date  . Blood in the stool    10 yrs ago  . Diverticulitis   . History of abnormal Pap smear   . History of chicken pox   . History of colon polyps   . Hypercholesterolemia   . Seronegative rheumatoid arthritis [facility])    Past Surgical History:  Procedure Laterality Date  . DILATION AND CURETTAGE OF UTERUS    . LEG / ANKLE SOFT TISSUE BIOPSY  2011   to confirm arthritis   Family History  Problem Relation Age of Onset  . Arthritis Father   . Hyperlipidemia Father   . Heart disease Father   . Hypertension Father   . Diabetes Father   . Pulmonary fibrosis Father   . Arthritis Mother   . Heart disease Mother   . Hyperlipidemia Mother   . Hypertension Mother   . Colon cancer Maternal Aunt   . Colon cancer Maternal Uncle    Social History   Social History  . Marital status: Divorced    Spouse name: N/A  . Number of children: N/A  . Years of education: N/A   Social History Main Topics  . Smoking status: Never Smoker  . Smokeless tobacco: Never Used  . Alcohol use No  . Drug use: No  . Sexual activity: Not Asked   Other Topics Concern  . None   Social History Narrative  . None    Outpatient Encounter Prescriptions as of 06/21/2016  Medication Sig  . aspirin 81 MG tablet Take 81 mg by mouth daily.    . calcium elemental as carbonate (TUMS ULTRA 1000) 400 MG tablet Chew 1,000 mg  by mouth daily.    . Doxylamine Succinate, Sleep, (SLEEP AID PO) OTC as directed.   . meloxicam (MOBIC) 15 MG tablet Take 15 mg by mouth daily.  . Multiple Vitamins-Minerals (CENTRUM ULTRA WOMENS PO) Take by mouth.  . Omega-3 Fatty Acids (FISH OIL) 1000 MG CAPS Take 1,000 mg by mouth daily.    . simvastatin (ZOCOR) 20 MG tablet Take 1 tablet by mouth  every evening  . Tavaborole (KERYDIN) 5 % SOLN Apply 1 drop topically daily.  . traMADol (ULTRAM) 50 MG tablet Take 50 mg by mouth every 6 (six) hours as needed for pain.  Marland Kitchen nystatin cream (MYCOSTATIN) Apply 1 application topically 2 (two) times daily.  . pantoprazole (PROTONIX) 40 MG tablet Take 1 tablet (40 mg total) by mouth daily.  . [DISCONTINUED] folic acid (FOLVITE) 1 MG tablet Take 1 mg by mouth daily.    . [DISCONTINUED] oxybutynin (DITROPAN) 5 MG tablet TAKE 1 TABLET BY MOUTH  DAILY (Patient not taking: Reported on 06/21/2016)  . [DISCONTINUED] pantoprazole (PROTONIX) 40 MG tablet Take 1 tablet (40 mg total) by mouth daily.   No facility-administered encounter  medications on file as of 06/21/2016.     Review of Systems  Constitutional: Negative for appetite change and unexpected weight change.  HENT: Negative for congestion and sinus pressure.   Eyes: Negative for pain and visual disturbance.  Respiratory: Negative for cough, chest tightness and shortness of breath.   Cardiovascular: Negative for chest pain, palpitations and leg swelling.  Gastrointestinal: Negative for abdominal pain, diarrhea, nausea and vomiting.       Reflux as outlined.    Genitourinary: Negative for difficulty urinating and dysuria.  Musculoskeletal: Negative for myalgias.       Knee pain as outlined.    Skin: Negative for color change and rash.  Neurological: Negative for dizziness and headaches.  Hematological: Negative for adenopathy. Does not bruise/bleed easily.  Psychiatric/Behavioral: Negative for agitation and dysphoric mood.       Objective:      Blood pressure rechecked by me:  130/82  Physical Exam  Constitutional: She is oriented to person, place, and time. She appears well-developed and well-nourished. No distress.  HENT:  Nose: Nose normal.  Mouth/Throat: Oropharynx is clear and moist.  Eyes: Right eye exhibits no discharge. Left eye exhibits no discharge. No scleral icterus.  Neck: Neck supple. No thyromegaly present.  Cardiovascular: Normal rate and regular rhythm.   Pulmonary/Chest: Breath sounds normal. No accessory muscle usage. No tachypnea. No respiratory distress. She has no decreased breath sounds. She has no wheezes. She has no rhonchi. Right breast exhibits no inverted nipple, no mass, no nipple discharge and no tenderness (no axillary adenopathy). Left breast exhibits no inverted nipple, no mass, no nipple discharge and no tenderness (no axilarry adenopathy).  Abdominal: Soft. Bowel sounds are normal. There is no tenderness.  Musculoskeletal: She exhibits no edema or tenderness.  Lymphadenopathy:    She has no cervical adenopathy.  Neurological: She is alert and oriented to person, place, and time.  Skin: Skin is warm. No rash noted. No erythema.  Bruising on left forearm.    Psychiatric: She has a normal mood and affect. Her behavior is normal.    BP 130/82   Pulse 92   Ht 5' 0.5" (1.537 m)   Wt 176 lb (79.8 kg)   LMP 07/16/1996   SpO2 92%   BMI 33.81 kg/m  Wt Readings from Last 3 Encounters:  06/21/16 176 lb (79.8 kg)  12/22/15 175 lb (79.4 kg)  06/01/15 171 lb (77.6 kg)     Lab Results  Component Value Date   WBC 6.5 06/21/2016   HGB 12.9 06/21/2016   HCT 39.7 06/21/2016   PLT 438.0 (H) 06/21/2016   GLUCOSE 114 (H) 06/21/2016   CHOL 144 06/21/2016   TRIG 108.0 06/21/2016   HDL 44.30 06/21/2016   LDLDIRECT 153.7 11/15/2010   LDLCALC 78 06/21/2016   ALT 24 06/21/2016   AST 24 06/21/2016   NA 139 06/21/2016   K 4.2 06/21/2016   CL 105 06/21/2016   CREATININE 0.68 06/21/2016   BUN 20  06/21/2016   CO2 26 06/21/2016   TSH 2.70 06/21/2016   INR 1.1 (H) 06/21/2016    Mm Screening Breast Tomo Bilateral  Result Date: 08/31/2015 CLINICAL DATA:  Screening. EXAM: DIGITAL SCREENING BILATERAL MAMMOGRAM WITH 3D TOMO WITH CAD COMPARISON:  Previous exam(s). ACR Breast Density Category b: There are scattered areas of fibroglandular density. FINDINGS: There are no findings suspicious for malignancy. Images were processed with CAD. IMPRESSION: No mammographic evidence of malignancy. A result letter of this screening mammogram will be  mailed directly to the patient. RECOMMENDATION: Screening mammogram in one year. (Code:SM-B-01Y) BI-RADS CATEGORY  1: Negative. Electronically Signed   By: Fidela Salisbury M.D.   On: 08/31/2015 08:18       Assessment & Plan:   Problem List Items Addressed This Visit    Health care maintenance    Physical today 06/21/16.  Mammogram 08/31/15 - Birads I.  Colonoscopy 2010.  Due f/u in 2020.        History of colon polyps    Colonoscopy 2010.  Due f/u colonoscopy in 2020.        Hyperlipidemia - Primary    Low cholesterol diet and exercise.  Follow lipid panel.        Relevant Orders   Hepatic function panel (Completed)   Lipid panel (Completed)   TSH (Completed)   Basic metabolic panel (Completed)   Knee pain, bilateral    With knee pain as outlined.  Desires no further intervention at this time.  Follow.       Obesity (BMI 30-39.9)    Diet and exercise.  Follow.        Other Visit Diagnoses    Easy bruising       check cbc and pt/inr.     Relevant Orders   CBC with Differential/Platelet (Completed)   Protime-INR (Completed)       Einar Pheasant, MD

## 2016-06-23 ENCOUNTER — Encounter: Payer: Self-pay | Admitting: Internal Medicine

## 2016-06-23 NOTE — Assessment & Plan Note (Signed)
Colonoscopy 2010.  Due f/u colonoscopy in 2020.

## 2016-06-23 NOTE — Assessment & Plan Note (Signed)
Low cholesterol diet and exercise.  Follow lipid panel.   

## 2016-06-23 NOTE — Assessment & Plan Note (Signed)
Diet and exercise.  Follow.  

## 2016-06-23 NOTE — Assessment & Plan Note (Signed)
With knee pain as outlined.  Desires no further intervention at this time.  Follow.

## 2016-06-25 ENCOUNTER — Other Ambulatory Visit: Payer: Self-pay | Admitting: Internal Medicine

## 2016-06-25 DIAGNOSIS — R739 Hyperglycemia, unspecified: Secondary | ICD-10-CM

## 2016-06-25 DIAGNOSIS — R718 Other abnormality of red blood cells: Secondary | ICD-10-CM

## 2016-06-25 NOTE — Progress Notes (Signed)
Order placed for f/u labs.  

## 2016-07-03 ENCOUNTER — Other Ambulatory Visit (INDEPENDENT_AMBULATORY_CARE_PROVIDER_SITE_OTHER): Payer: Medicare Other

## 2016-07-03 DIAGNOSIS — R718 Other abnormality of red blood cells: Secondary | ICD-10-CM

## 2016-07-03 DIAGNOSIS — R739 Hyperglycemia, unspecified: Secondary | ICD-10-CM | POA: Diagnosis not present

## 2016-07-03 LAB — IBC PANEL
IRON: 31 ug/dL — AB (ref 42–145)
Saturation Ratios: 6.9 % — ABNORMAL LOW (ref 20.0–50.0)
TRANSFERRIN: 321 mg/dL (ref 212.0–360.0)

## 2016-07-03 LAB — CBC WITH DIFFERENTIAL/PLATELET
BASOS ABS: 0.1 10*3/uL (ref 0.0–0.1)
Basophils Relative: 1 % (ref 0.0–3.0)
EOS ABS: 0.2 10*3/uL (ref 0.0–0.7)
Eosinophils Relative: 2.8 % (ref 0.0–5.0)
HCT: 38.7 % (ref 36.0–46.0)
HEMOGLOBIN: 12.5 g/dL (ref 12.0–15.0)
LYMPHS PCT: 28.7 % (ref 12.0–46.0)
Lymphs Abs: 2 10*3/uL (ref 0.7–4.0)
MCHC: 32.2 g/dL (ref 30.0–36.0)
MCV: 71.8 fl — ABNORMAL LOW (ref 78.0–100.0)
Monocytes Absolute: 0.7 10*3/uL (ref 0.1–1.0)
Monocytes Relative: 10.4 % (ref 3.0–12.0)
Neutro Abs: 4 10*3/uL (ref 1.4–7.7)
Neutrophils Relative %: 57.1 % (ref 43.0–77.0)
Platelets: 424 10*3/uL — ABNORMAL HIGH (ref 150.0–400.0)
RBC: 5.39 Mil/uL — AB (ref 3.87–5.11)
RDW: 22.1 % — ABNORMAL HIGH (ref 11.5–15.5)
WBC: 7 10*3/uL (ref 4.0–10.5)

## 2016-07-03 LAB — FERRITIN: FERRITIN: 7.2 ng/mL — AB (ref 10.0–291.0)

## 2016-07-03 LAB — HEMOGLOBIN A1C: Hgb A1c MFr Bld: 6.2 % (ref 4.6–6.5)

## 2016-07-04 ENCOUNTER — Other Ambulatory Visit: Payer: Self-pay | Admitting: Internal Medicine

## 2016-07-04 DIAGNOSIS — D509 Iron deficiency anemia, unspecified: Secondary | ICD-10-CM

## 2016-07-04 LAB — GLUCOSE, FASTING: Glucose, Fasting: 93 mg/dL (ref 65–99)

## 2016-07-04 NOTE — Progress Notes (Signed)
Order placed for f/u cbc and ferritin.   

## 2016-07-05 ENCOUNTER — Encounter: Payer: Self-pay | Admitting: Internal Medicine

## 2016-07-05 DIAGNOSIS — R718 Other abnormality of red blood cells: Secondary | ICD-10-CM | POA: Insufficient documentation

## 2016-07-14 ENCOUNTER — Other Ambulatory Visit: Payer: Self-pay | Admitting: Internal Medicine

## 2016-08-09 ENCOUNTER — Telehealth: Payer: Self-pay | Admitting: Internal Medicine

## 2016-08-09 MED ORDER — OSELTAMIVIR PHOSPHATE 75 MG PO CAPS: 75.0000 mg | ORAL_CAPSULE | Freq: Every day | ORAL | 0 refills | Status: DC

## 2016-08-09 NOTE — Telephone Encounter (Signed)
Pt called and stated that she has been exposed to the flu. Her mom is in rehab and has it. Is there any precautions that she should take. She is just getting over a cold herself and is in daily contact with her mom.Please advise, thank you!  Call pt @ [phone]

## 2016-08-09 NOTE — Telephone Encounter (Signed)
Patient advised of below and verbalized understanding.  

## 2016-08-09 NOTE — Telephone Encounter (Signed)
I can prescribe her the prophylactic dose of tamiflu.  This would be one tablet per day for 10 days.  If she develops any symptoms then she is to take tamiflu 75mg  bid x 5 days.  Also, encourage her to wash hands, wear mask, etc.   If wants this called in can prescribe tamiflu 75mg  q day x 10 days.  Let me know if any questions or problems.

## 2016-08-09 NOTE — Telephone Encounter (Signed)
rx sent in for tamiflu.  Let us know if any problems.

## 2016-08-09 NOTE — Telephone Encounter (Signed)
Patient advised of below and verbalized understanding . She wants script called into [facility] . Do you want me to send into pharmacy ? Please advise.

## 2016-08-09 NOTE — Telephone Encounter (Signed)
Patient is not yet having symptoms but has been exposed to flu ay mothers SNF , asking for precautions.

## 2016-08-28 ENCOUNTER — Encounter: Payer: Self-pay | Admitting: Internal Medicine

## 2016-08-28 ENCOUNTER — Ambulatory Visit (INDEPENDENT_AMBULATORY_CARE_PROVIDER_SITE_OTHER): Payer: Medicare Other | Admitting: Internal Medicine

## 2016-08-28 DIAGNOSIS — E669 Obesity, unspecified: Secondary | ICD-10-CM | POA: Diagnosis not present

## 2016-08-28 DIAGNOSIS — D509 Iron deficiency anemia, unspecified: Secondary | ICD-10-CM | POA: Diagnosis not present

## 2016-08-28 DIAGNOSIS — E785 Hyperlipidemia, unspecified: Secondary | ICD-10-CM

## 2016-08-28 DIAGNOSIS — R030 Elevated blood-pressure reading, without diagnosis of hypertension: Secondary | ICD-10-CM | POA: Diagnosis not present

## 2016-08-28 DIAGNOSIS — R51 Headache: Secondary | ICD-10-CM | POA: Diagnosis not present

## 2016-08-28 DIAGNOSIS — R519 Headache, unspecified: Secondary | ICD-10-CM

## 2016-08-28 NOTE — Progress Notes (Signed)
[name], female   DOB: Apr 21, 1946, 71 y.o.   MRN: [id number]   Subjective:    [name], female    DOB: 1946-04-27, 71 y.o.   MRN: [id number]  HPI  Patient here for a scheduled follow up.  She reports she has been having intermittent headaches over the last 2 months.  May occur 2x/week.  Only last for a brief period then resolves.  No other associated symptoms.  No vision change.  No rash.  No dizziness or light headedness.  No chest pain.  Breathing stable.  Blood pressure elevated.  Eating and drinking.  No nausea or vomiting.  Bowels moving.     Past Medical History:  Diagnosis Date  . Blood in the stool    10 yrs ago  . Diverticulitis   . History of abnormal Pap smear   . History of chicken pox   . History of colon polyps   . Hypercholesterolemia   . Seronegative rheumatoid arthritis [facility])    Past Surgical History:  Procedure Laterality Date  . DILATION AND CURETTAGE OF UTERUS    . LEG / ANKLE SOFT TISSUE BIOPSY  2011   to confirm arthritis   Family History  Problem Relation Age of Onset  . Arthritis Father   . Hyperlipidemia Father   . Heart disease Father   . Hypertension Father   . Diabetes Father   . Pulmonary fibrosis Father   . Arthritis Mother   . Heart disease Mother   . Hyperlipidemia Mother   . Hypertension Mother   . Colon cancer Maternal Aunt   . Colon cancer Maternal Uncle    Social History   Social History  . Marital status: Divorced    Spouse name: N/A  . Number of children: N/A  . Years of education: N/A   Social History Main Topics  . Smoking status: Never Smoker  . Smokeless tobacco: Never Used  . Alcohol use No  . Drug use: No  . Sexual activity: Not Asked   Other Topics Concern  . None   Social History Narrative  . None    Outpatient Encounter Prescriptions as of 08/28/2016  Medication Sig  . aspirin 81 MG tablet Take 81 mg by mouth daily.    . calcium elemental as carbonate (TUMS ULTRA 1000) 400  MG tablet Chew 1,000 mg by mouth daily.    . Chromium Picolinate 500 MCG TABS Take by mouth daily.  . Doxylamine Succinate, Sleep, (SLEEP AID PO) OTC as directed.   . meloxicam (MOBIC) 15 MG tablet Take 15 mg by mouth daily.  . Multiple Vitamins-Minerals (CENTRUM ULTRA WOMENS PO) Take by mouth.  . nystatin cream (MYCOSTATIN) Apply 1 application topically 2 (two) times daily.  . Omega-3 Fatty Acids (FISH OIL) 1000 MG CAPS Take 1,000 mg by mouth daily.    . pantoprazole (PROTONIX) 40 MG tablet Take 1 tablet (40 mg total) by mouth daily.  . simvastatin (ZOCOR) 20 MG tablet TAKE 1 TABLET BY MOUTH  EVERY EVENING  . Tavaborole (KERYDIN) 5 % SOLN Apply 1 drop topically daily. (Patient not taking: Reported on 09/04/2016)  . traMADol (ULTRAM) 50 MG tablet Take 50 mg by mouth every 6 (six) hours as needed for pain.  Marland Kitchen oseltamivir (TAMIFLU) 75 MG capsule   . [DISCONTINUED] oseltamivir (TAMIFLU) 75 MG capsule Take 1 capsule (75 mg total) by mouth daily. (Patient not taking: Reported on 08/28/2016)   No facility-administered encounter medications on file  as of 08/28/2016.     Review of Systems  Constitutional: Negative for appetite change and unexpected weight change.  HENT: Negative for congestion and sinus pressure.   Respiratory: Negative for cough, chest tightness and shortness of breath.   Cardiovascular: Negative for chest pain, palpitations and leg swelling.  Gastrointestinal: Negative for abdominal pain, diarrhea, nausea and vomiting.  Genitourinary: Negative for difficulty urinating and dysuria.  Musculoskeletal: Negative for back pain and joint swelling.  Skin: Negative for color change and rash.  Neurological: Positive for headaches. Negative for dizziness.  Psychiatric/Behavioral: Negative for agitation and dysphoric mood.       Objective:    Physical Exam  Constitutional: She appears well-developed and well-nourished. No distress.  HENT:  Nose: Nose normal.  Mouth/Throat: Oropharynx  is clear and moist.  Neck: Neck supple. No thyromegaly present.  Cardiovascular: Normal rate and regular rhythm.   Pulmonary/Chest: Breath sounds normal. No respiratory distress. She has no wheezes.  Abdominal: Soft. Bowel sounds are normal. There is no tenderness.  Musculoskeletal: She exhibits no edema or tenderness.  Lymphadenopathy:    She has no cervical adenopathy.  Skin: No rash noted. No erythema.  Psychiatric: She has a normal mood and affect. Her behavior is normal.    BP (!) 158/84 (BP Location: Left Arm, Patient Position: Sitting, Cuff Size: Large)   Pulse 62   Temp 98.7 F (37.1 C) (Oral)   Resp 16   Ht 5\' 1"  (1.549 m)   Wt 172 lb (78 kg)   LMP 07/16/1996   SpO2 97%   BMI 32.50 kg/m  Wt Readings from Last 3 Encounters:  09/04/16 171 lb 2 oz (77.6 kg)  08/28/16 172 lb (78 kg)  06/21/16 176 lb (79.8 kg)     Lab Results  Component Value Date   WBC 5.4 08/28/2016   HGB 14.6 08/28/2016   HCT 43.5 08/28/2016   PLT 433.0 (H) 08/28/2016   GLUCOSE 114 (H) 06/21/2016   CHOL 144 06/21/2016   TRIG 108.0 06/21/2016   HDL 44.30 06/21/2016   LDLDIRECT 153.7 11/15/2010   LDLCALC 78 06/21/2016   ALT 24 06/21/2016   AST 24 06/21/2016   NA 139 06/21/2016   K 4.2 06/21/2016   CL 105 06/21/2016   CREATININE 0.68 06/21/2016   BUN 20 06/21/2016   CO2 26 06/21/2016   TSH 2.70 06/21/2016   INR 1.1 (H) 06/21/2016   HGBA1C 6.2 07/03/2016    Mm Screening Breast Tomo Bilateral  Result Date: 08/31/2015 CLINICAL DATA:  Screening. EXAM: DIGITAL SCREENING BILATERAL MAMMOGRAM WITH 3D TOMO WITH CAD COMPARISON:  Previous exam(s). ACR Breast Density Category b: There are scattered areas of fibroglandular density. FINDINGS: There are no findings suspicious for malignancy. Images were processed with CAD. IMPRESSION: No mammographic evidence of malignancy. A result letter of this screening mammogram will be mailed directly to the patient. RECOMMENDATION: Screening mammogram in one year.  (Code:SM-B-01Y) BI-RADS CATEGORY  1: Negative. Electronically Signed   By: Fidela Salisbury M.D.   On: 08/31/2015 08:18       Assessment & Plan:   Problem List Items Addressed This Visit    Elevated blood pressure reading    Elevated blood pressure.  Have her spot check her pressure.  Not on any decongestants.  Get her back in soon to reassess.        Headache    Persistent intermittent headache.  Discussed with her today.  Unclear etiology.  No headache now.  Follow blood pressure.  Given no history of headaches and now persistent intermittent headache, will check mri.  Have her spot check her pressure.  Get her back in soon to reassess.        Relevant Orders   MR Brain W Wo Contrast   Hyperlipidemia    Low cholesterol diet and exercise.  Follow lipid panel.        Obesity (BMI 30-39.9)    Diet and exercise.  Follow.        Other Visit Diagnoses    Iron deficiency anemia, unspecified iron deficiency anemia type           Einar Pheasant, MD

## 2016-08-28 NOTE — Progress Notes (Signed)
Pre-visit discussion using our clinic review tool. No additional management support is needed unless otherwise documented below in the visit note.  

## 2016-08-29 LAB — CBC WITH DIFFERENTIAL/PLATELET
BASOS PCT: 1.3 % (ref 0.0–3.0)
Basophils Absolute: 0.1 10*3/uL (ref 0.0–0.1)
EOS PCT: 3.4 % (ref 0.0–5.0)
Eosinophils Absolute: 0.2 10*3/uL (ref 0.0–0.7)
HCT: 43.5 % (ref 36.0–46.0)
HEMOGLOBIN: 14.6 g/dL (ref 12.0–15.0)
Lymphocytes Relative: 34.2 % (ref 12.0–46.0)
Lymphs Abs: 1.9 10*3/uL (ref 0.7–4.0)
MCHC: 33.5 g/dL (ref 30.0–36.0)
MCV: 80.4 fl (ref 78.0–100.0)
MONOS PCT: 10.7 % (ref 3.0–12.0)
Monocytes Absolute: 0.6 10*3/uL (ref 0.1–1.0)
Neutro Abs: 2.7 10*3/uL (ref 1.4–7.7)
Neutrophils Relative %: 50.4 % (ref 43.0–77.0)
Platelets: 433 10*3/uL — ABNORMAL HIGH (ref 150.0–400.0)
RBC: 5.41 Mil/uL — AB (ref 3.87–5.11)
RDW: 22.5 % — AB (ref 11.5–15.5)
WBC: 5.4 10*3/uL (ref 4.0–10.5)

## 2016-08-29 LAB — FERRITIN: Ferritin: 14.6 ng/mL (ref 10.0–291.0)

## 2016-08-30 ENCOUNTER — Encounter: Payer: Self-pay | Admitting: Internal Medicine

## 2016-09-03 ENCOUNTER — Other Ambulatory Visit: Payer: Self-pay | Admitting: Internal Medicine

## 2016-09-03 ENCOUNTER — Telehealth: Payer: Self-pay | Admitting: Internal Medicine

## 2016-09-03 DIAGNOSIS — Z1231 Encounter for screening mammogram for malignant neoplasm of breast: Secondary | ICD-10-CM

## 2016-09-03 DIAGNOSIS — R519 Headache, unspecified: Secondary | ICD-10-CM | POA: Insufficient documentation

## 2016-09-03 DIAGNOSIS — R51 Headache: Secondary | ICD-10-CM

## 2016-09-03 NOTE — Telephone Encounter (Signed)
Appointment scheduled for tomorrow at 1:00pm per Dr Nicki Reaper

## 2016-09-03 NOTE — Telephone Encounter (Signed)
Pt has questions about her blood presser running high around 140/90.Marland Kitchen Please advise pt at [phone]

## 2016-09-03 NOTE — Telephone Encounter (Signed)
Left message to call.

## 2016-09-03 NOTE — Telephone Encounter (Signed)
See attached note.

## 2016-09-03 NOTE — Telephone Encounter (Signed)
Called patient she also wanted you to know: Pt has questions about her blood presser running high around 140/90.Marland Kitchen Please advise pt at [phone]  She also wanted to get the MRI scheduled that you talked to her about at last o/v

## 2016-09-03 NOTE — Telephone Encounter (Signed)
Patient states BP elevated as follows  15/89 2/15 pm 132/91 2/16 12:30pm 151/91 2/17 2:00pm 141/92 2/18 12:30pm 147/94 2/19 9:30 am Patient is current having pains in her head , there is a MRI being scheduled.   Please advise.

## 2016-09-03 NOTE — Telephone Encounter (Signed)
Pt called wanting to follow up on getting a MRI referral. Please advise? Thank you!

## 2016-09-03 NOTE — Telephone Encounter (Signed)
I have ordered the MRI, but if the headache is changing/worsening, I recommend for her to go ahead and be evaluated.  Blood pressure increased.  Looks like will need medication.  If headache unchanged and just concerned about persistent blood pressure, then can see if margaret can see tomorrow and start her on medication.  Let me know if any problems.

## 2016-09-03 NOTE — Telephone Encounter (Signed)
Please call pt at [phone]

## 2016-09-03 NOTE — Telephone Encounter (Signed)
See other message

## 2016-09-03 NOTE — Assessment & Plan Note (Addendum)
Persistent intermittent headache.  Discussed with her today.  Unclear etiology.  No headache now.  Follow blood pressure.  Given no history of headaches and now persistent intermittent headache, will check mri.  Have her spot check her pressure.  Get her back in soon to reassess.

## 2016-09-04 ENCOUNTER — Ambulatory Visit (INDEPENDENT_AMBULATORY_CARE_PROVIDER_SITE_OTHER): Payer: Medicare Other | Admitting: Internal Medicine

## 2016-09-04 ENCOUNTER — Encounter: Payer: Self-pay | Admitting: Internal Medicine

## 2016-09-04 DIAGNOSIS — I10 Essential (primary) hypertension: Secondary | ICD-10-CM

## 2016-09-04 DIAGNOSIS — R51 Headache: Secondary | ICD-10-CM | POA: Diagnosis not present

## 2016-09-04 DIAGNOSIS — R519 Headache, unspecified: Secondary | ICD-10-CM

## 2016-09-04 MED ORDER — LISINOPRIL 10 MG PO TABS: 10.0000 mg | ORAL_TABLET | Freq: Every day | ORAL | 1 refills | Status: DC

## 2016-09-04 NOTE — Telephone Encounter (Signed)
Patient has been added as requested by PCP.

## 2016-09-04 NOTE — Progress Notes (Signed)
Pre visit review using our clinic review tool, if applicable. No additional management support is needed unless otherwise documented below in the visit note. 

## 2016-09-04 NOTE — Progress Notes (Signed)
[name], female   DOB: [birth year], 71 y.o.   MRN: [id number]   Subjective:    [name], female    DOB: 05-Oct-1945, 71 y.o.   MRN: [id number]  HPI  Patient here as a work in to discuss persistent elevated blood pressure.  Has noticed some headache as outlined.  Feels related to her elevated blood pressure.  No vision change.  Had previously discussed MRI.  She declines now.  Wants to treat blood pressure and see if headache resolves before pursuing MRI.  No chest pain.  Breathing stable.  Eating.  No nausea or vomiting.    Past Medical History:  Diagnosis Date  . Blood in the stool    10 yrs ago  . Diverticulitis   . History of abnormal Pap smear   . History of chicken pox   . History of colon polyps   . Hypercholesterolemia   . Seronegative rheumatoid arthritis [facility])    Past Surgical History:  Procedure Laterality Date  . DILATION AND CURETTAGE OF UTERUS    . LEG / ANKLE SOFT TISSUE BIOPSY  2011   to confirm arthritis   Family History  Problem Relation Age of Onset  . Arthritis Father   . Hyperlipidemia Father   . Heart disease Father   . Hypertension Father   . Diabetes Father   . Pulmonary fibrosis Father   . Arthritis Mother   . Heart disease Mother   . Hyperlipidemia Mother   . Hypertension Mother   . Colon cancer Maternal Aunt   . Colon cancer Maternal Uncle    Social History   Social History  . Marital status: Divorced    Spouse name: N/A  . Number of children: N/A  . Years of education: N/A   Social History Main Topics  . Smoking status: Never Smoker  . Smokeless tobacco: Never Used  . Alcohol use No  . Drug use: No  . Sexual activity: Not Asked   Other Topics Concern  . None   Social History Narrative  . None    Outpatient Encounter Prescriptions as of 09/04/2016  Medication Sig  . aspirin 81 MG tablet Take 81 mg by mouth daily.    . calcium elemental as carbonate (TUMS ULTRA 1000) 400 MG tablet Chew 1,000 mg by  mouth daily.    . Chromium Picolinate 500 MCG TABS Take by mouth daily.  . Doxylamine Succinate, Sleep, (SLEEP AID PO) OTC as directed.   Marland Kitchen lisinopril (PRINIVIL,ZESTRIL) 10 MG tablet Take 1 tablet (10 mg total) by mouth daily.  . meloxicam (MOBIC) 15 MG tablet Take 15 mg by mouth daily.  . Multiple Vitamins-Minerals (CENTRUM ULTRA WOMENS PO) Take by mouth.  . nystatin cream (MYCOSTATIN) Apply 1 application topically 2 (two) times daily.  . Omega-3 Fatty Acids (FISH OIL) 1000 MG CAPS Take 1,000 mg by mouth daily.    Marland Kitchen oseltamivir (TAMIFLU) 75 MG capsule   . pantoprazole (PROTONIX) 40 MG tablet Take 1 tablet (40 mg total) by mouth daily.  . simvastatin (ZOCOR) 20 MG tablet TAKE 1 TABLET BY MOUTH  EVERY EVENING  . Tavaborole (KERYDIN) 5 % SOLN Apply 1 drop topically daily. (Patient not taking: Reported on 09/04/2016)  . traMADol (ULTRAM) 50 MG tablet Take 50 mg by mouth every 6 (six) hours as needed for pain.   No facility-administered encounter medications on file as of 09/04/2016.     Review of Systems  Constitutional: Negative for appetite  change and unexpected weight change.  HENT: Negative for congestion and sinus pressure.   Respiratory: Negative for cough, chest tightness and shortness of breath.   Cardiovascular: Negative for chest pain, palpitations and leg swelling.  Gastrointestinal: Negative for abdominal pain, diarrhea, nausea and vomiting.  Genitourinary: Negative for difficulty urinating and dysuria.  Musculoskeletal: Negative for back pain and joint swelling.  Skin: Negative for color change and rash.  Neurological:       Headaches as outlined.    Psychiatric/Behavioral: Negative for agitation and dysphoric mood.       Objective:    Physical Exam  Constitutional: She appears well-developed and well-nourished.  HENT:  Nose: Nose normal.  Mouth/Throat: Oropharynx is clear and moist.  Neck: Neck supple. No thyromegaly present.  Cardiovascular: Normal rate and regular  rhythm.   Pulmonary/Chest: Breath sounds normal. No respiratory distress. She has no wheezes.  Abdominal: Soft. Bowel sounds are normal. There is no tenderness.  Musculoskeletal: She exhibits no edema or tenderness.  Lymphadenopathy:    She has no cervical adenopathy.  Skin: No rash noted. No erythema.  Psychiatric: She has a normal mood and affect. Her behavior is normal.    BP (!) 148/98 (BP Location: Left Arm, Patient Position: Sitting, Cuff Size: Small)   Pulse 88   Temp 98.3 F (36.8 C) (Oral)   Resp 16   Wt 171 lb 2 oz (77.6 kg)   LMP 07/16/1996   SpO2 97%   BMI 32.33 kg/m  Wt Readings from Last 3 Encounters:  09/04/16 171 lb 2 oz (77.6 kg)  08/28/16 172 lb (78 kg)  06/21/16 176 lb (79.8 kg)     Lab Results  Component Value Date   WBC 5.4 08/28/2016   HGB 14.6 08/28/2016   HCT 43.5 08/28/2016   PLT 433.0 (H) 08/28/2016   GLUCOSE 121 (H) 09/14/2016   CHOL 144 06/21/2016   TRIG 108.0 06/21/2016   HDL 44.30 06/21/2016   LDLDIRECT 153.7 11/15/2010   LDLCALC 78 06/21/2016   ALT 24 06/21/2016   AST 24 06/21/2016   NA 140 09/14/2016   K 3.6 09/14/2016   CL 105 09/14/2016   CREATININE 0.71 09/14/2016   BUN 22 09/14/2016   CO2 30 09/14/2016   TSH 2.70 06/21/2016   INR 1.1 (H) 06/21/2016   HGBA1C 6.2 07/03/2016    Mm Screening Breast Tomo Bilateral  Result Date: 08/31/2015 CLINICAL DATA:  Screening. EXAM: DIGITAL SCREENING BILATERAL MAMMOGRAM WITH 3D TOMO WITH CAD COMPARISON:  Previous exam(s). ACR Breast Density Category b: There are scattered areas of fibroglandular density. FINDINGS: There are no findings suspicious for malignancy. Images were processed with CAD. IMPRESSION: No mammographic evidence of malignancy. A result letter of this screening mammogram will be mailed directly to the patient. RECOMMENDATION: Screening mammogram in one year. (Code:SM-B-01Y) BI-RADS CATEGORY  1: Negative. Electronically Signed   By: Fidela Salisbury M.D.   On: 08/31/2015  08:18       Assessment & Plan:   Problem List Items Addressed This Visit    Headache    See previous note.  Discussed mri. She wants to hold now.  Wants to treat blood pressure and see if headache resolves.  Treat blood pressure as outlined.  Follow blood pressure.       Hypertension    Persistent elevated blood pressure.  Start lisinopril 10mg  q day.  Follow pressures.  Get her back in soon to reassess.  Check metabolic panel in 123456 days.  Relevant Medications   lisinopril (PRINIVIL,ZESTRIL) 10 MG tablet       Einar Pheasant, MD

## 2016-09-09 ENCOUNTER — Encounter: Payer: Self-pay | Admitting: Internal Medicine

## 2016-09-09 DIAGNOSIS — R030 Elevated blood-pressure reading, without diagnosis of hypertension: Secondary | ICD-10-CM | POA: Insufficient documentation

## 2016-09-09 NOTE — Assessment & Plan Note (Signed)
Elevated blood pressure.  Have her spot check her pressure.  Not on any decongestants.  Get her back in soon to reassess.

## 2016-09-09 NOTE — Assessment & Plan Note (Signed)
Low cholesterol diet and exercise.  Follow lipid panel.   

## 2016-09-09 NOTE — Assessment & Plan Note (Signed)
Diet and exercise.  Follow.  

## 2016-09-13 ENCOUNTER — Other Ambulatory Visit: Payer: Self-pay | Admitting: Internal Medicine

## 2016-09-13 ENCOUNTER — Telehealth: Payer: Self-pay | Admitting: Radiology

## 2016-09-13 DIAGNOSIS — I10 Essential (primary) hypertension: Secondary | ICD-10-CM

## 2016-09-13 NOTE — Progress Notes (Signed)
Order placed for met b

## 2016-09-13 NOTE — Telephone Encounter (Signed)
Order placed for f/u met b

## 2016-09-13 NOTE — Telephone Encounter (Signed)
PT coming for labs tomorrow, please place future orders. Thank you.  

## 2016-09-14 ENCOUNTER — Other Ambulatory Visit (INDEPENDENT_AMBULATORY_CARE_PROVIDER_SITE_OTHER): Payer: Medicare Other

## 2016-09-14 DIAGNOSIS — I10 Essential (primary) hypertension: Secondary | ICD-10-CM

## 2016-09-14 LAB — BASIC METABOLIC PANEL
BUN: 22 mg/dL (ref 6–23)
CO2: 30 mEq/L (ref 19–32)
CREATININE: 0.71 mg/dL (ref 0.40–1.20)
Calcium: 9.6 mg/dL (ref 8.4–10.5)
Chloride: 105 mEq/L (ref 96–112)
GFR: 86 mL/min (ref >60.00)
GLUCOSE: 121 mg/dL — AB (ref 70–99)
Potassium: 3.6 mEq/L (ref 3.5–5.1)
Sodium: 140 mEq/L (ref 135–145)

## 2016-09-16 ENCOUNTER — Encounter: Payer: Self-pay | Admitting: Internal Medicine

## 2016-09-16 DIAGNOSIS — I10 Essential (primary) hypertension: Secondary | ICD-10-CM | POA: Insufficient documentation

## 2016-09-16 NOTE — Assessment & Plan Note (Signed)
See previous note.  Discussed mri. She wants to hold now.  Wants to treat blood pressure and see if headache resolves.  Treat blood pressure as outlined.  Follow blood pressure.

## 2016-09-16 NOTE — Assessment & Plan Note (Signed)
Persistent elevated blood pressure.  Start lisinopril 10mg  q day.  Follow pressures.  Get her back in soon to reassess.  Check metabolic panel in 123456 days.

## 2016-09-17 ENCOUNTER — Ambulatory Visit: Payer: Medicare Other

## 2016-09-17 ENCOUNTER — Encounter: Payer: Self-pay | Admitting: Internal Medicine

## 2016-09-25 ENCOUNTER — Encounter: Payer: Self-pay | Admitting: Internal Medicine

## 2016-09-25 MED ORDER — LISINOPRIL 20 MG PO TABS: 20.0000 mg | ORAL_TABLET | Freq: Every day | ORAL | 1 refills | Status: DC

## 2016-09-25 NOTE — Telephone Encounter (Signed)
rx sent in for lisinopril 20mg  #30 with one refill.  Pt notified via my chart.

## 2016-09-26 ENCOUNTER — Ambulatory Visit
Admission: RE | Admit: 2016-09-26 | Discharge: 2016-09-26 | Disposition: A | Discharge status: Home / Self Care | Payer: Medicare Other | Source: Ambulatory Visit | Attending: Internal Medicine | Admitting: Internal Medicine

## 2016-09-26 DIAGNOSIS — Z1231 Encounter for screening mammogram for malignant neoplasm of breast: Secondary | ICD-10-CM | POA: Diagnosis not present

## 2016-09-26 LAB — HM MAMMOGRAPHY

## 2016-09-27 NOTE — Telephone Encounter (Signed)
SPoke with the patient, she will remain on the 10mg  for now, has an appt with PCP in the next few weeks. Will follow up then. thanks

## 2016-09-27 NOTE — Telephone Encounter (Signed)
Please call pt and see what is going on.  If acute dizziness, elevated blood pressure recommend evaluation today and then can f/u with her.   Thanks.

## 2016-09-27 NOTE — Telephone Encounter (Signed)
Dizziness during the night and then dizzy again this morning,, But seems to be subsiding now. Patient stated when she was nauseated with the dizziness, patient has had some loose,stools X 3 today, denies fever, chill, . Has took no blood pressure med's since yesterday patient took 10 mg early in the morning and then 10 mg at lunch. Last BP was 143/98.

## 2016-09-27 NOTE — Telephone Encounter (Signed)
Please notify pt to stay on 10mg  lisinopril daily instead of increasing to 20mg  (for now).  Continue to monitor blood pressure and symptoms, if persistent problems, needs to be evaluated.  Any acute changes - eval

## 2016-10-10 ENCOUNTER — Encounter: Payer: Self-pay | Admitting: Internal Medicine

## 2016-10-10 ENCOUNTER — Ambulatory Visit (INDEPENDENT_AMBULATORY_CARE_PROVIDER_SITE_OTHER): Payer: Medicare Other | Admitting: Internal Medicine

## 2016-10-10 VITALS — BP 130/74 | HR 89 | Temp 98.3°F | Resp 14 | Ht 61.0 in | Wt 171.0 lb

## 2016-10-10 DIAGNOSIS — R51 Headache: Secondary | ICD-10-CM | POA: Diagnosis not present

## 2016-10-10 DIAGNOSIS — R739 Hyperglycemia, unspecified: Secondary | ICD-10-CM | POA: Diagnosis not present

## 2016-10-10 DIAGNOSIS — I10 Essential (primary) hypertension: Secondary | ICD-10-CM | POA: Diagnosis not present

## 2016-10-10 DIAGNOSIS — R519 Headache, unspecified: Secondary | ICD-10-CM

## 2016-10-10 DIAGNOSIS — E785 Hyperlipidemia, unspecified: Secondary | ICD-10-CM

## 2016-10-10 MED ORDER — LISINOPRIL 10 MG PO TABS: 10.0000 mg | ORAL_TABLET | Freq: Every day | ORAL | 1 refills | Status: DC

## 2016-10-10 NOTE — Progress Notes (Signed)
[name], female   DOB: 21-Apr-1946, 71 y.o.   MRN: [id number]   Subjective:    [name], female    DOB: 1945-07-19, 71 y.o.   MRN: [id number]  HPI  Patient here for a scheduled follow up.  Here to f/u on her blood pressure.  She reports she feels better.  No significant headache now.  Blood pressure is better.  She is taking lisinopril 69m q day.  Had intolerance to increasing to 266mq day.  Blood pressures reviewed and averaging 120-140/70-90s.  No chest pain.  Stays active.  Breathing stable.     Past Medical History:  Diagnosis Date  . Blood in the stool    10 yrs ago  . Diverticulitis   . History of abnormal Pap smear   . History of chicken pox   . History of colon polyps   . Hypercholesterolemia   [facility]   Past Surgical History:  Procedure Laterality Date  . DILATION AND CURETTAGE OF UTERUS    . LEG / ANKLE SOFT TISSUE BIOPSY  2011   to confirm arthritis   Family History  Problem Relation Age of Onset  . Arthritis Father   . Hyperlipidemia Father   . Heart disease Father   . Hypertension Father   . Diabetes Father   . Pulmonary fibrosis Father   . Arthritis Mother   . Heart disease Mother   . Hyperlipidemia Mother   . Hypertension Mother   . Colon cancer Maternal Aunt   . Colon cancer Maternal Uncle   . Breast cancer Neg Hx    Social History   Social History  . Marital status: Divorced    Spouse name: N/A  . Number of children: N/A  . Years of education: N/A   Social History Main Topics  . Smoking status: Never Smoker  . Smokeless tobacco: Never Used  . Alcohol use No  . Drug use: No  . Sexual activity: Not Asked   Other Topics Concern  . None   Social History Narrative  . None    Outpatient Encounter Prescriptions as of 10/10/2016  Medication Sig  . aspirin 81 MG tablet Take 81 mg by mouth daily.    . calcium elemental as carbonate (TUMS ULTRA 1000) 400 MG tablet Chew 1,000 mg  by mouth daily.    . Chromium Picolinate 500 MCG TABS Take by mouth daily.  . Doxylamine Succinate, Sleep, (SLEEP AID PO) OTC as directed.   . Marland Kitchenisinopril (PRINIVIL,ZESTRIL) 10 MG tablet Take 1 tablet (10 mg total) by mouth daily.  . meloxicam (MOBIC) 15 MG tablet Take 15 mg by mouth daily.  . Multiple Vitamins-Minerals (CENTRUM ULTRA WOMENS PO) Take by mouth.  . nystatin cream (MYCOSTATIN) Apply 1 application topically 2 (two) times daily.  . pantoprazole (PROTONIX) 40 MG tablet Take 1 tablet (40 mg total) by mouth daily.  . simvastatin (ZOCOR) 20 MG tablet TAKE 1 TABLET BY MOUTH  EVERY EVENING  . traMADol (ULTRAM) 50 MG tablet Take 50 mg by mouth every 6 (six) hours as needed for pain.  . [DISCONTINUED] lisinopril (PRINIVIL,ZESTRIL) 20 MG tablet Take 1 tablet (20 mg total) by mouth daily. (Patient taking differently: Take 10 mg by mouth daily. )  . [DISCONTINUED] Omega-3 Fatty Acids (FISH OIL) 1000 MG CAPS Take 1,000 mg by mouth daily.    . [DISCONTINUED] oseltamivir (TAMIFLU) 75 MG capsule   . [DISCONTINUED] Tavaborole (KERYDIN) 5 %  SOLN Apply 1 drop topically daily. (Patient not taking: Reported on 09/04/2016)   No facility-administered encounter medications on file as of 10/10/2016.     Review of Systems  Constitutional: Negative for appetite change and unexpected weight change.  HENT: Negative for congestion and sinus pressure.   Respiratory: Negative for cough, chest tightness and shortness of breath.   Cardiovascular: Negative for chest pain, palpitations and leg swelling.  Gastrointestinal: Negative for abdominal pain, diarrhea, nausea and vomiting.  Genitourinary: Negative for difficulty urinating and dysuria.  Musculoskeletal: Negative for back pain and joint swelling.  Skin: Negative for color change and rash.  Neurological: Negative for dizziness and light-headedness.       No significant headache now.    Psychiatric/Behavioral: Negative for agitation and dysphoric mood.        Objective:     Blood pressure rechecked by me:  128/78  Physical Exam  Constitutional: She appears well-developed and well-nourished. No distress.  HENT:  Nose: Nose normal.  Mouth/Throat: Oropharynx is clear and moist.  Neck: Neck supple. No thyromegaly present.  Cardiovascular: Normal rate and regular rhythm.   Pulmonary/Chest: Breath sounds normal. No respiratory distress. She has no wheezes.  Abdominal: Soft. Bowel sounds are normal. There is no tenderness.  Musculoskeletal: She exhibits no edema or tenderness.  Lymphadenopathy:    She has no cervical adenopathy.  Skin: No rash noted. No erythema.  Psychiatric: She has a normal mood and affect. Her behavior is normal.    BP 130/74 (BP Location: Left Arm, Patient Position: Sitting, Cuff Size: Large)   Pulse 89   Temp 98.3 F (36.8 C) (Oral)   Resp 14   Ht '5\' 1"'  (1.549 m)   Wt 171 lb (77.6 kg)   LMP 07/16/1996   SpO2 95%   BMI 32.31 kg/m  Wt Readings from Last 3 Encounters:  10/10/16 171 lb (77.6 kg)  09/04/16 171 lb 2 oz (77.6 kg)  08/28/16 172 lb (78 kg)     Lab Results  Component Value Date   WBC 5.4 08/28/2016   HGB 14.6 08/28/2016   HCT 43.5 08/28/2016   PLT 433.0 (H) 08/28/2016   GLUCOSE 121 (H) 09/14/2016   CHOL 144 06/21/2016   TRIG 108.0 06/21/2016   HDL 44.30 06/21/2016   LDLDIRECT 153.7 11/15/2010   LDLCALC 78 06/21/2016   ALT 24 06/21/2016   AST 24 06/21/2016   NA 140 09/14/2016   K 3.6 09/14/2016   CL 105 09/14/2016   CREATININE 0.71 09/14/2016   BUN 22 09/14/2016   CO2 30 09/14/2016   TSH 2.70 06/21/2016   INR 1.1 (H) 06/21/2016   HGBA1C 6.2 07/03/2016    Mm Screening Breast Tomo Bilateral  Result Date: 09/26/2016 CLINICAL DATA:  Screening. EXAM: 2D DIGITAL SCREENING BILATERAL MAMMOGRAM WITH CAD AND ADJUNCT TOMO COMPARISON:  Previous exam(s). ACR Breast Density Category b: There are scattered areas of fibroglandular density. FINDINGS: There are no findings suspicious for malignancy.  Images were processed with CAD. IMPRESSION: No mammographic evidence of malignancy. A result letter of this screening mammogram will be mailed directly to the patient. RECOMMENDATION: Screening mammogram in one year. (Code:SM-B-01Y) BI-RADS CATEGORY  1: Negative. Electronically Signed   By: Lillia Mountain M.D.   On: 09/26/2016 15:22       Assessment & Plan:   Problem List Items Addressed This Visit    Headache    Improved.  Not a significant issue for her now.  Follow.  Hyperlipidemia    Low cholesterol diet and exercise.  Follow lipid panel.        Relevant Medications   lisinopril (PRINIVIL,ZESTRIL) 10 MG tablet   Other Relevant Orders   Hepatic function panel   Lipid panel   Hypertension    Blood pressure as outlined.  Checks here look good.  She is planning to exercise more.  Discussed diet adjustment with decreased sodium, etc.  Hold on making changes in her medication.  Follow pressures.  Follow metabolic panel.        Relevant Medications   lisinopril (PRINIVIL,ZESTRIL) 10 MG tablet   Other Relevant Orders   CBC with Differential/Platelet   Basic metabolic panel    Other Visit Diagnoses    Hyperglycemia    -  Primary   Low carb diet and exercise.  follow met b and a1c.    Relevant Orders   Hemoglobin A1c       Einar Pheasant, MD

## 2016-10-10 NOTE — Progress Notes (Signed)
Pre-visit discussion using our clinic review tool. No additional management support is needed unless otherwise documented below in the visit note.  

## 2016-10-13 ENCOUNTER — Encounter: Payer: Self-pay | Admitting: Internal Medicine

## 2016-10-13 NOTE — Assessment & Plan Note (Signed)
Improved. Not a significant issue for her now.  Follow.  

## 2016-10-13 NOTE — Assessment & Plan Note (Signed)
Blood pressure as outlined.  Checks here look good.  She is planning to exercise more.  Discussed diet adjustment with decreased sodium, etc.  Hold on making changes in her medication.  Follow pressures.  Follow metabolic panel.

## 2016-10-13 NOTE — Assessment & Plan Note (Signed)
Low cholesterol diet and exercise.  Follow lipid panel.   

## 2016-10-22 ENCOUNTER — Encounter: Payer: Self-pay | Admitting: Internal Medicine

## 2016-10-30 DIAGNOSIS — M25562 Pain in left knee: Secondary | ICD-10-CM | POA: Diagnosis not present

## 2016-10-30 DIAGNOSIS — M170 Bilateral primary osteoarthritis of knee: Secondary | ICD-10-CM | POA: Diagnosis not present

## 2016-10-30 DIAGNOSIS — M25561 Pain in right knee: Secondary | ICD-10-CM | POA: Diagnosis not present

## 2016-11-15 ENCOUNTER — Other Ambulatory Visit: Payer: Self-pay | Admitting: Internal Medicine

## 2016-12-04 ENCOUNTER — Inpatient Hospital Stay: Payer: Medicare Other | Admitting: Oncology

## 2016-12-12 ENCOUNTER — Encounter: Payer: Self-pay | Admitting: Internal Medicine

## 2016-12-12 ENCOUNTER — Other Ambulatory Visit (INDEPENDENT_AMBULATORY_CARE_PROVIDER_SITE_OTHER): Payer: Medicare Other

## 2016-12-12 DIAGNOSIS — I10 Essential (primary) hypertension: Secondary | ICD-10-CM

## 2016-12-12 DIAGNOSIS — E785 Hyperlipidemia, unspecified: Secondary | ICD-10-CM | POA: Diagnosis not present

## 2016-12-12 DIAGNOSIS — R739 Hyperglycemia, unspecified: Secondary | ICD-10-CM

## 2016-12-12 LAB — BASIC METABOLIC PANEL
BUN: 18 mg/dL (ref 6–23)
CALCIUM: 9.5 mg/dL (ref 8.4–10.5)
CO2: 29 mEq/L (ref 19–32)
Chloride: 105 mEq/L (ref 96–112)
Creatinine, Ser: 0.68 mg/dL (ref 0.40–1.20)
GFR: 90.33 mL/min (ref >60.00)
GLUCOSE: 111 mg/dL — AB (ref 70–99)
Potassium: 3.6 mEq/L (ref 3.5–5.1)
Sodium: 139 mEq/L (ref 135–145)

## 2016-12-12 LAB — HEPATIC FUNCTION PANEL
ALT: 17 U/L (ref 0–35)
AST: 20 U/L (ref 0–37)
Albumin: 4.2 g/dL (ref 3.5–5.2)
Alkaline Phosphatase: 81 U/L (ref 39–117)
BILIRUBIN TOTAL: 0.5 mg/dL (ref 0.2–1.2)
Bilirubin, Direct: 0.1 mg/dL (ref 0.0–0.3)
Total Protein: 7.3 g/dL (ref 6.0–8.3)

## 2016-12-12 LAB — CBC WITH DIFFERENTIAL/PLATELET
Basophils Absolute: 0.1 10*3/uL (ref 0.0–0.1)
Basophils Relative: 1.2 % (ref 0.0–3.0)
EOS ABS: 0.2 10*3/uL (ref 0.0–0.7)
EOS PCT: 3.5 % (ref 0.0–5.0)
HCT: 44.1 % (ref 36.0–46.0)
Hemoglobin: 14.6 g/dL (ref 12.0–15.0)
Lymphocytes Relative: 29.7 % (ref 12.0–46.0)
Lymphs Abs: 1.8 10*3/uL (ref 0.7–4.0)
MCHC: 33.1 g/dL (ref 30.0–36.0)
MCV: 87 fl (ref 78.0–100.0)
Monocytes Absolute: 0.5 10*3/uL (ref 0.1–1.0)
Monocytes Relative: 8.5 % (ref 3.0–12.0)
NEUTROS PCT: 57.1 % (ref 43.0–77.0)
Neutro Abs: 3.5 10*3/uL (ref 1.4–7.7)
Platelets: 371 10*3/uL (ref 150.0–400.0)
RBC: 5.07 Mil/uL (ref 3.87–5.11)
RDW: 15.6 % — AB (ref 11.5–15.5)
WBC: 6.1 10*3/uL (ref 4.0–10.5)

## 2016-12-12 LAB — LIPID PANEL
CHOL/HDL RATIO: 4
CHOLESTEROL: 149 mg/dL (ref 0–200)
HDL: 35.6 mg/dL — ABNORMAL LOW (ref >39.00)
LDL CALC: 80 mg/dL (ref 0–99)
NonHDL: 113.45
TRIGLYCERIDES: 168 mg/dL — AB (ref 0.0–149.0)
VLDL: 33.6 mg/dL (ref 0.0–40.0)

## 2016-12-12 LAB — HEMOGLOBIN A1C: HEMOGLOBIN A1C: 6.2 % (ref 4.6–6.5)

## 2016-12-17 ENCOUNTER — Ambulatory Visit (INDEPENDENT_AMBULATORY_CARE_PROVIDER_SITE_OTHER): Payer: Medicare Other | Admitting: Internal Medicine

## 2016-12-17 ENCOUNTER — Encounter: Payer: Self-pay | Admitting: Internal Medicine

## 2016-12-17 DIAGNOSIS — R739 Hyperglycemia, unspecified: Secondary | ICD-10-CM | POA: Diagnosis not present

## 2016-12-17 DIAGNOSIS — R51 Headache: Secondary | ICD-10-CM

## 2016-12-17 DIAGNOSIS — R519 Headache, unspecified: Secondary | ICD-10-CM

## 2016-12-17 DIAGNOSIS — I10 Essential (primary) hypertension: Secondary | ICD-10-CM

## 2016-12-17 DIAGNOSIS — E669 Obesity, unspecified: Secondary | ICD-10-CM | POA: Diagnosis not present

## 2016-12-17 DIAGNOSIS — E785 Hyperlipidemia, unspecified: Secondary | ICD-10-CM | POA: Diagnosis not present

## 2016-12-17 MED ORDER — NYSTATIN 100000 UNIT/GM EX CREA: 1.0000 "application " | TOPICAL_CREAM | Freq: Two times a day (BID) | CUTANEOUS | 0 refills | Status: DC

## 2016-12-17 NOTE — Progress Notes (Signed)
[name], female   DOB: [birth year], 71 y.o.   MRN: [id number]   Subjective:    [name], female    DOB: [birth year], 71 y.o.   MRN: [id number]  HPI  Patient here for a scheduled follow up.  She reports she is doing better.  Feels better.  No dizziness.  No headache.  No acid reflux.  No chest paing or sob.  No abdominal pain.  Bowels moving.  On lisinopril.  Tolerating.  Blood pressures doing better.  Attached pressures reviewed.  Recent checks averaging 110-120s/80s.     Past Medical History:  Diagnosis Date  . Blood in the stool    10 yrs ago  . Diverticulitis   . History of abnormal Pap smear   . History of chicken pox   . History of colon polyps   . Hypercholesterolemia   . Seronegative rheumatoid arthritis [facility])    Past Surgical History:  Procedure Laterality Date  . DILATION AND CURETTAGE OF UTERUS    . LEG / ANKLE SOFT TISSUE BIOPSY  2011   to confirm arthritis   Family History  Problem Relation Age of Onset  . Arthritis Father   . Hyperlipidemia Father   . Heart disease Father   . Hypertension Father   . Diabetes Father   . Pulmonary fibrosis Father   . Arthritis Mother   . Heart disease Mother   . Hyperlipidemia Mother   . Hypertension Mother   . Colon cancer Maternal Aunt   . Colon cancer Maternal Uncle   . Breast cancer Neg Hx    Social History   Social History  . Marital status: Divorced    Spouse name: N/A  . Number of children: N/A  . Years of education: N/A   Social History Main Topics  . Smoking status: Never Smoker  . Smokeless tobacco: Never Used  . Alcohol use No  . Drug use: No  . Sexual activity: Not Asked   Other Topics Concern  . None   Social History Narrative  . None    Outpatient Encounter Prescriptions as of 12/17/2016  Medication Sig  . aspirin 81 MG tablet Take 81 mg by mouth daily.    . calcium elemental as carbonate (TUMS ULTRA 1000) 400 MG tablet Chew 1,000 mg by mouth daily.    . Chromium  Picolinate 500 MCG TABS Take by mouth daily.  . Doxylamine Succinate, Sleep, (SLEEP AID PO) OTC as directed.   Marland Kitchen lisinopril (PRINIVIL,ZESTRIL) 10 MG tablet Take 1 tablet (10 mg total) by mouth daily.  . meloxicam (MOBIC) 15 MG tablet Take 15 mg by mouth daily.  . Multiple Vitamins-Minerals (CENTRUM ULTRA WOMENS PO) Take by mouth.  . nystatin cream (MYCOSTATIN) Apply 1 application topically 2 (two) times daily.  . pantoprazole (PROTONIX) 40 MG tablet TAKE 1 TABLET BY MOUTH  DAILY  . simvastatin (ZOCOR) 20 MG tablet TAKE 1 TABLET BY MOUTH  EVERY EVENING  . traMADol (ULTRAM) 50 MG tablet Take 50 mg by mouth every 6 (six) hours as needed for pain.  . [DISCONTINUED] nystatin cream (MYCOSTATIN) Apply 1 application topically 2 (two) times daily.   No facility-administered encounter medications on file as of 12/17/2016.     Review of Systems  Constitutional: Negative for appetite change and unexpected weight change.  HENT: Negative for congestion and sinus pressure.   Respiratory: Negative for cough, chest tightness and shortness of breath.   Cardiovascular: Negative for chest pain, palpitations  and leg swelling.  Gastrointestinal: Negative for abdominal pain, diarrhea, nausea and vomiting.  Genitourinary: Negative for difficulty urinating and dysuria.  Musculoskeletal: Negative for back pain and joint swelling.  Skin: Negative for color change and rash.  Neurological: Negative for dizziness and headaches.  Psychiatric/Behavioral: Negative for agitation and dysphoric mood.       Objective:     Blood pressure rechecked by me:  130/78  Physical Exam  Constitutional: She appears well-developed and well-nourished. No distress.  HENT:  Nose: Nose normal.  Mouth/Throat: Oropharynx is clear and moist.  Neck: Neck supple. No thyromegaly present.  Cardiovascular: Normal rate and regular rhythm.   Pulmonary/Chest: Breath sounds normal. No respiratory distress. She has no wheezes.  Abdominal:  Soft. Bowel sounds are normal. There is no tenderness.  Musculoskeletal: She exhibits no edema or tenderness.  Lymphadenopathy:    She has no cervical adenopathy.  Skin: No rash noted. No erythema.  Psychiatric: She has a normal mood and affect. Her behavior is normal.    BP 120/68 (BP Location: Left Arm, Patient Position: Sitting, Cuff Size: Normal)   Pulse 68   Temp 98.6 F (37 C) (Oral)   Resp 12   Ht _0  (1.549 m)   Wt 171 lb 12.8 oz (77.9 kg)   LMP 07/16/1996   SpO2 97%   BMI 32.46 kg/m  Wt Readings from Last 3 Encounters:  12/17/16 171 lb 12.8 oz (77.9 kg)  10/10/16 171 lb (77.6 kg)  09/04/16 171 lb 2 oz (77.6 kg)     Lab Results  Component Value Date   WBC 6.1 12/12/2016   HGB 14.6 12/12/2016   HCT 44.1 12/12/2016   PLT 371.0 12/12/2016   GLUCOSE 111 (H) 12/12/2016   CHOL 149 12/12/2016   TRIG 168.0 (H) 12/12/2016   HDL 35.60 (L) 12/12/2016   LDLDIRECT 153.7 11/15/2010   LDLCALC 80 12/12/2016   ALT 17 12/12/2016   AST 20 12/12/2016   NA 139 12/12/2016   K 3.6 12/12/2016   CL 105 12/12/2016   CREATININE 0.68 12/12/2016   BUN 18 12/12/2016   CO2 29 12/12/2016   TSH 2.70 06/21/2016   INR 1.1 (H) 06/21/2016   HGBA1C 6.2 12/12/2016    Mm Screening Breast Tomo Bilateral  Result Date: 09/26/2016 CLINICAL DATA:  Screening. EXAM: 2D DIGITAL SCREENING BILATERAL MAMMOGRAM WITH CAD AND ADJUNCT TOMO COMPARISON:  Previous exam(s). ACR Breast Density Category b: There are scattered areas of fibroglandular density. FINDINGS: There are no findings suspicious for malignancy. Images were processed with CAD. IMPRESSION: No mammographic evidence of malignancy. A result letter of this screening mammogram will be mailed directly to the patient. RECOMMENDATION: Screening mammogram in one year. (Code:SM-B-01Y) BI-RADS CATEGORY  1: Negative. Electronically Signed   By: Lillia Mountain M.D.   On: 09/26/2016 15:22       Assessment & Plan:   Problem List Items Addressed This Visit     Headache    Improved.  Not an issue for her now.  Follow.        Hyperglycemia    Low carb diet and exercise.  Follow met b and a1c.       Relevant Orders   Hemoglobin A1c   Hyperlipidemia    Low cholesterol diet and exercise.  On simvastatin.  Follow lipid panel and liver function tests.        Relevant Orders   Hepatic function panel   Lipid panel   Hypertension    Blood pressure  under good control.  Continue same medication regimen.  Follow pressures.  Follow metabolic panel.        Relevant Orders   Basic metabolic panel   Obesity (BMI 30-39.9)    Diet and exercise.  Follow.           Einar Pheasant, MD

## 2016-12-19 ENCOUNTER — Encounter: Payer: Self-pay | Admitting: Internal Medicine

## 2016-12-19 DIAGNOSIS — R739 Hyperglycemia, unspecified: Secondary | ICD-10-CM | POA: Insufficient documentation

## 2016-12-19 NOTE — Assessment & Plan Note (Signed)
Improved.  Not an issue for her now.  Follow.

## 2016-12-19 NOTE — Assessment & Plan Note (Signed)
Diet and exercise.  Follow.  

## 2016-12-19 NOTE — Assessment & Plan Note (Signed)
Blood pressure under good control.  Continue same medication regimen.  Follow pressures.  Follow metabolic panel.   

## 2016-12-19 NOTE — Assessment & Plan Note (Signed)
Low carb diet and exercise.  Follow met b and a1c.

## 2016-12-19 NOTE — Assessment & Plan Note (Signed)
Low cholesterol diet and exercise.  On simvastatin.  Follow lipid panel and liver function tests.   

## 2017-01-07 DIAGNOSIS — M5136 Other intervertebral disc degeneration, lumbar region: Secondary | ICD-10-CM | POA: Diagnosis not present

## 2017-01-07 DIAGNOSIS — M47816 Spondylosis without myelopathy or radiculopathy, lumbar region: Secondary | ICD-10-CM | POA: Diagnosis not present

## 2017-01-08 ENCOUNTER — Encounter: Payer: Self-pay | Admitting: Internal Medicine

## 2017-01-08 MED ORDER — LISINOPRIL 10 MG PO TABS: 10.0000 mg | ORAL_TABLET | Freq: Every day | ORAL | 4 refills | Status: DC

## 2017-01-09 MED ORDER — LISINOPRIL 10 MG PO TABS: 10.0000 mg | ORAL_TABLET | Freq: Every day | ORAL | 4 refills | Status: DC

## 2017-01-09 NOTE — Addendum Note (Signed)
Addended by: Bevelyn Ngo on: 01/09/2017 07:40 AM   Modules accepted: Orders

## 2017-02-01 ENCOUNTER — Other Ambulatory Visit: Payer: Self-pay | Admitting: Internal Medicine

## 2017-02-26 DIAGNOSIS — M170 Bilateral primary osteoarthritis of knee: Secondary | ICD-10-CM | POA: Diagnosis not present

## 2017-03-06 ENCOUNTER — Telehealth: Payer: Self-pay | Admitting: Internal Medicine

## 2017-03-06 NOTE — Telephone Encounter (Signed)
Called pt to scheduled AWV. No answer, no vm. Phone just kept ringing  NOTE* Never had AWV before  Caitlin Bennett call back [phone]

## 2017-03-15 ENCOUNTER — Other Ambulatory Visit: Payer: Medicare Other

## 2017-03-21 ENCOUNTER — Other Ambulatory Visit (INDEPENDENT_AMBULATORY_CARE_PROVIDER_SITE_OTHER): Payer: Medicare Other

## 2017-03-21 DIAGNOSIS — E785 Hyperlipidemia, unspecified: Secondary | ICD-10-CM

## 2017-03-21 DIAGNOSIS — I10 Essential (primary) hypertension: Secondary | ICD-10-CM

## 2017-03-21 DIAGNOSIS — R739 Hyperglycemia, unspecified: Secondary | ICD-10-CM | POA: Diagnosis not present

## 2017-03-21 LAB — BASIC METABOLIC PANEL
BUN: 22 mg/dL (ref 6–23)
CALCIUM: 9.5 mg/dL (ref 8.4–10.5)
CO2: 25 meq/L (ref 19–32)
CREATININE: 0.66 mg/dL (ref 0.40–1.20)
Chloride: 106 mEq/L (ref 96–112)
GFR: 93.43 mL/min (ref >60.00)
GLUCOSE: 106 mg/dL — AB (ref 70–99)
Potassium: 3.9 mEq/L (ref 3.5–5.1)
Sodium: 140 mEq/L (ref 135–145)

## 2017-03-21 LAB — HEPATIC FUNCTION PANEL
ALT: 23 U/L (ref 0–35)
AST: 22 U/L (ref 0–37)
Albumin: 4.3 g/dL (ref 3.5–5.2)
Alkaline Phosphatase: 80 U/L (ref 39–117)
BILIRUBIN DIRECT: 0.1 mg/dL (ref 0.0–0.3)
BILIRUBIN TOTAL: 0.4 mg/dL (ref 0.2–1.2)
Total Protein: 7.4 g/dL (ref 6.0–8.3)

## 2017-03-21 LAB — LIPID PANEL
CHOL/HDL RATIO: 4
Cholesterol: 151 mg/dL (ref 0–200)
HDL: 39.2 mg/dL (ref >39.00)
LDL CALC: 80 mg/dL (ref 0–99)
NONHDL: 111.38
TRIGLYCERIDES: 158 mg/dL — AB (ref 0.0–149.0)
VLDL: 31.6 mg/dL (ref 0.0–40.0)

## 2017-03-21 LAB — HEMOGLOBIN A1C: Hgb A1c MFr Bld: 6.1 % (ref 4.6–6.5)

## 2017-03-22 ENCOUNTER — Encounter: Payer: Self-pay | Admitting: Internal Medicine

## 2017-03-22 ENCOUNTER — Ambulatory Visit: Payer: Medicare Other | Admitting: Internal Medicine

## 2017-03-25 ENCOUNTER — Ambulatory Visit (INDEPENDENT_AMBULATORY_CARE_PROVIDER_SITE_OTHER): Payer: Medicare Other | Admitting: Internal Medicine

## 2017-03-25 ENCOUNTER — Encounter: Payer: Self-pay | Admitting: Internal Medicine

## 2017-03-25 DIAGNOSIS — M25562 Pain in left knee: Secondary | ICD-10-CM | POA: Diagnosis not present

## 2017-03-25 DIAGNOSIS — E785 Hyperlipidemia, unspecified: Secondary | ICD-10-CM | POA: Diagnosis not present

## 2017-03-25 DIAGNOSIS — N3281 Overactive bladder: Secondary | ICD-10-CM | POA: Diagnosis not present

## 2017-03-25 DIAGNOSIS — R739 Hyperglycemia, unspecified: Secondary | ICD-10-CM

## 2017-03-25 DIAGNOSIS — I10 Essential (primary) hypertension: Secondary | ICD-10-CM

## 2017-03-25 DIAGNOSIS — M25561 Pain in right knee: Secondary | ICD-10-CM | POA: Diagnosis not present

## 2017-03-25 DIAGNOSIS — Z23 Encounter for immunization: Secondary | ICD-10-CM | POA: Diagnosis not present

## 2017-03-25 DIAGNOSIS — Z6831 Body mass index (BMI) 31.0-31.9, adult: Secondary | ICD-10-CM | POA: Diagnosis not present

## 2017-03-25 NOTE — Progress Notes (Signed)
[name], female   DOB: 1945-08-25, 71 y.o.   MRN: [id number]   Subjective:    [name], female    DOB: 1945/12/09, 71 y.o.   MRN: [id number]  HPI  Patient here for a scheduled follow up.  She reports she is doing well.  Feels good.  Stays active.  No chest pain.  No sob.  No acid reflux.  No abdominal pain.  Bowels moving.  No urine change.  Has had issues with her knees.  Saw ortho.  S/p injection.  Previously gave blood.  Will check cbc and iron.     Past Medical History:  Diagnosis Date  . Blood in the stool    10 yrs ago  . Diverticulitis   . History of abnormal Pap smear   . History of chicken pox   . History of colon polyps   . Hypercholesterolemia   . Seronegative rheumatoid arthritis [facility])    Past Surgical History:  Procedure Laterality Date  . DILATION AND CURETTAGE OF UTERUS    . LEG / ANKLE SOFT TISSUE BIOPSY  2011   to confirm arthritis   Family History  Problem Relation Age of Onset  . Arthritis Father   . Hyperlipidemia Father   . Heart disease Father   . Hypertension Father   . Diabetes Father   . Pulmonary fibrosis Father   . Arthritis Mother   . Heart disease Mother   . Hyperlipidemia Mother   . Hypertension Mother   . Colon cancer Maternal Aunt   . Colon cancer Maternal Uncle   . Breast cancer Neg Hx    Social History   Social History  . Marital status: Divorced    Spouse name: N/A  . Number of children: N/A  . Years of education: N/A   Social History Main Topics  . Smoking status: Never Smoker  . Smokeless tobacco: Never Used  . Alcohol use No  . Drug use: No  . Sexual activity: Not Asked   Other Topics Concern  . None   Social History Narrative  . None    Outpatient Encounter Prescriptions as of 03/25/2017  Medication Sig  . aspirin 81 MG tablet Take 81 mg by mouth daily.    . calcium elemental as carbonate (TUMS ULTRA 1000) 400 MG tablet Chew 1,000 mg by mouth daily.    . Chromium Picolinate 500  MCG TABS Take by mouth daily.  . Doxylamine Succinate, Sleep, (SLEEP AID PO) OTC as directed.   Marland Kitchen lisinopril (PRINIVIL,ZESTRIL) 10 MG tablet Take 1 tablet (10 mg total) by mouth daily.  . meloxicam (MOBIC) 15 MG tablet Take 15 mg by mouth daily.  . Multiple Vitamins-Minerals (CENTRUM ULTRA WOMENS PO) Take by mouth.  . nystatin cream (MYCOSTATIN) Apply 1 application topically 2 (two) times daily.  Marland Kitchen oxybutynin (DITROPAN) 5 MG tablet TAKE 1 TABLET BY MOUTH  DAILY (Patient taking differently: TAKE 1 TABLET BY MOUTH AS NEEDED)  . pantoprazole (PROTONIX) 40 MG tablet TAKE 1 TABLET BY MOUTH  DAILY  . simvastatin (ZOCOR) 20 MG tablet TAKE 1 TABLET BY MOUTH  EVERY EVENING  . traMADol (ULTRAM) 50 MG tablet Take 50 mg by mouth every 6 (six) hours as needed for pain.  . [DISCONTINUED] lisinopril (PRINIVIL,ZESTRIL) 10 MG tablet Take 1 tablet (10 mg total) by mouth daily.   No facility-administered encounter medications on file as of 03/25/2017.     Review of Systems  Constitutional: Negative for appetite  change and unexpected weight change.  HENT: Negative for congestion and sinus pressure.   Respiratory: Negative for cough, chest tightness and shortness of breath.   Cardiovascular: Negative for chest pain, palpitations and leg swelling.  Gastrointestinal: Negative for abdominal pain, diarrhea, nausea and vomiting.  Genitourinary: Negative for difficulty urinating and dysuria.  Musculoskeletal: Negative for back pain and joint swelling.       Knee pain as outlined.    Skin: Negative for color change and rash.  Neurological: Negative for dizziness, light-headedness and headaches.  Psychiatric/Behavioral: Negative for agitation and dysphoric mood.       Objective:     Blood pressure rechecked by me:  128/78  Physical Exam  Constitutional: She appears well-developed and well-nourished. No distress.  HENT:  Nose: Nose normal.  Mouth/Throat: Oropharynx is clear and moist.  Neck: Neck supple. No  thyromegaly present.  Cardiovascular: Normal rate and regular rhythm.   Pulmonary/Chest: Breath sounds normal. No respiratory distress. She has no wheezes.  Abdominal: Soft. Bowel sounds are normal. There is no tenderness.  Musculoskeletal: She exhibits no edema or tenderness.  Lymphadenopathy:    She has no cervical adenopathy.  Skin: No rash noted. No erythema.  Psychiatric: She has a normal mood and affect. Her behavior is normal.    BP 124/64 (BP Location: Left Arm, Patient Position: Sitting, Cuff Size: Normal)   Pulse 95   Temp 98.6 F (37 C) (Oral)   Resp 14   Ht 5' 1" (1.549 m)   Wt 169 lb 3.2 oz (76.7 kg)   LMP 07/16/1996   SpO2 95%   BMI 31.97 kg/m  Wt Readings from Last 3 Encounters:  03/25/17 169 lb 3.2 oz (76.7 kg)  12/17/16 171 lb 12.8 oz (77.9 kg)  10/10/16 171 lb (77.6 kg)     Lab Results  Component Value Date   WBC 6.1 12/12/2016   HGB 14.6 12/12/2016   HCT 44.1 12/12/2016   PLT 371.0 12/12/2016   GLUCOSE 106 (H) 03/21/2017   CHOL 151 03/21/2017   TRIG 158.0 (H) 03/21/2017   HDL 39.20 03/21/2017   LDLDIRECT 153.7 11/15/2010   LDLCALC 80 03/21/2017   ALT 23 03/21/2017   AST 22 03/21/2017   NA 140 03/21/2017   K 3.9 03/21/2017   CL 106 03/21/2017   CREATININE 0.66 03/21/2017   BUN 22 03/21/2017   CO2 25 03/21/2017   TSH 2.70 06/21/2016   INR 1.1 (H) 06/21/2016   HGBA1C 6.1 03/21/2017    Mm Screening Breast Tomo Bilateral  Result Date: 09/26/2016 CLINICAL DATA:  Screening. EXAM: 2D DIGITAL SCREENING BILATERAL MAMMOGRAM WITH CAD AND ADJUNCT TOMO COMPARISON:  Previous exam(s). ACR Breast Density Category b: There are scattered areas of fibroglandular density. FINDINGS: There are no findings suspicious for malignancy. Images were processed with CAD. IMPRESSION: No mammographic evidence of malignancy. A result letter of this screening mammogram will be mailed directly to the patient. RECOMMENDATION: Screening mammogram in one year. (Code:SM-B-01Y)  BI-RADS CATEGORY  1: Negative. Electronically Signed   By: Dina  Arceo M.D.   On: 09/26/2016 15:22       Assessment & Plan:   Problem List Items Addressed This Visit    BMI 31.0-31.9,adult    Diet and exercise.  Follow.       Hyperglycemia    Low carb diet and exercise.  Follow met b and a1c.       Relevant Orders   Hemoglobin A1c   Hyperlipidemia    On simvastatin.    Low cholesterol diet and exercise.  Follow lipid panel and liver function tests.        Relevant Medications   lisinopril (PRINIVIL,ZESTRIL) 10 MG tablet   Other Relevant Orders   Hepatic function panel   Lipid panel   Hypertension    Blood pressure under good control.  Continue same medication regimen.  Follow pressures.  Follow metabolic panel.        Relevant Medications   lisinopril (PRINIVIL,ZESTRIL) 10 MG tablet   Other Relevant Orders   Ferritin   CBC with Differential/Platelet   TSH   Basic metabolic panel   Knee pain, bilateral    Saw ortho.  S/p injection.       Overactive bladder    Ditropan.        Other Visit Diagnoses    Encounter for immunization       Relevant Medications   lisinopril (PRINIVIL,ZESTRIL) 10 MG tablet   Other Relevant Orders   Flu vaccine HIGH DOSE PF (Completed)       SCOTT, CHARLENE, MD  

## 2017-03-28 ENCOUNTER — Encounter: Payer: Self-pay | Admitting: Internal Medicine

## 2017-03-28 MED ORDER — LISINOPRIL 10 MG PO TABS: 10.0000 mg | ORAL_TABLET | Freq: Every day | ORAL | 1 refills | Status: DC

## 2017-03-28 NOTE — Assessment & Plan Note (Signed)
On simvastatin.  Low cholesterol diet and exercise.  Follow lipid panel and liver function tests.   

## 2017-03-28 NOTE — Assessment & Plan Note (Signed)
Diet and exercise.  Follow.  

## 2017-03-28 NOTE — Assessment & Plan Note (Signed)
Blood pressure under good control.  Continue same medication regimen.  Follow pressures.  Follow metabolic panel.   

## 2017-03-28 NOTE — Assessment & Plan Note (Signed)
Saw ortho.  S/p injection.  

## 2017-03-28 NOTE — Assessment & Plan Note (Signed)
Ditropan

## 2017-03-28 NOTE — Assessment & Plan Note (Signed)
Low carb diet and exercise.  Follow met b and a1c.  

## 2017-04-09 NOTE — Telephone Encounter (Signed)
Left pt message asking to call Allison back directly at [phone] to schedule AWV. Thanks! °  °*NOTE* No hx of AWV °

## 2017-04-11 NOTE — Telephone Encounter (Signed)
Pt declined AWV. °

## 2017-06-25 ENCOUNTER — Other Ambulatory Visit: Payer: Self-pay | Admitting: Internal Medicine

## 2017-07-22 ENCOUNTER — Other Ambulatory Visit (INDEPENDENT_AMBULATORY_CARE_PROVIDER_SITE_OTHER): Payer: Medicare HMO

## 2017-07-22 DIAGNOSIS — R739 Hyperglycemia, unspecified: Secondary | ICD-10-CM | POA: Diagnosis not present

## 2017-07-22 DIAGNOSIS — L570 Actinic keratosis: Secondary | ICD-10-CM | POA: Diagnosis not present

## 2017-07-22 DIAGNOSIS — E785 Hyperlipidemia, unspecified: Secondary | ICD-10-CM | POA: Diagnosis not present

## 2017-07-22 DIAGNOSIS — Z872 Personal history of diseases of the skin and subcutaneous tissue: Secondary | ICD-10-CM | POA: Diagnosis not present

## 2017-07-22 DIAGNOSIS — I10 Essential (primary) hypertension: Secondary | ICD-10-CM | POA: Diagnosis not present

## 2017-07-22 DIAGNOSIS — L578 Other skin changes due to chronic exposure to nonionizing radiation: Secondary | ICD-10-CM | POA: Diagnosis not present

## 2017-07-22 DIAGNOSIS — L821 Other seborrheic keratosis: Secondary | ICD-10-CM | POA: Diagnosis not present

## 2017-07-22 LAB — HEPATIC FUNCTION PANEL
ALK PHOS: 92 U/L (ref 39–117)
ALT: 28 U/L (ref 0–35)
AST: 23 U/L (ref 0–37)
Albumin: 4.2 g/dL (ref 3.5–5.2)
Bilirubin, Direct: 0.1 mg/dL (ref 0.0–0.3)
TOTAL PROTEIN: 6.8 g/dL (ref 6.0–8.3)
Total Bilirubin: 0.4 mg/dL (ref 0.2–1.2)

## 2017-07-22 LAB — CBC WITH DIFFERENTIAL/PLATELET
BASOS PCT: 0.8 % (ref 0.0–3.0)
Basophils Absolute: 0.1 10*3/uL (ref 0.0–0.1)
EOS PCT: 3.6 % (ref 0.0–5.0)
Eosinophils Absolute: 0.2 10*3/uL (ref 0.0–0.7)
HCT: 46.7 % — ABNORMAL HIGH (ref 36.0–46.0)
Hemoglobin: 15.4 g/dL — ABNORMAL HIGH (ref 12.0–15.0)
LYMPHS ABS: 2.1 10*3/uL (ref 0.7–4.0)
Lymphocytes Relative: 32.4 % (ref 12.0–46.0)
MCHC: 33 g/dL (ref 30.0–36.0)
MCV: 89.4 fl (ref 78.0–100.0)
MONO ABS: 0.6 10*3/uL (ref 0.1–1.0)
Monocytes Relative: 8.9 % (ref 3.0–12.0)
NEUTROS ABS: 3.6 10*3/uL (ref 1.4–7.7)
NEUTROS PCT: 54.3 % (ref 43.0–77.0)
PLATELETS: 435 10*3/uL — AB (ref 150.0–400.0)
RBC: 5.22 Mil/uL — ABNORMAL HIGH (ref 3.87–5.11)
RDW: 14.1 % (ref 11.5–15.5)
WBC: 6.6 10*3/uL (ref 4.0–10.5)

## 2017-07-22 LAB — LIPID PANEL
CHOLESTEROL: 154 mg/dL (ref 0–200)
HDL: 38.6 mg/dL — ABNORMAL LOW (ref >39.00)
LDL CALC: 87 mg/dL (ref 0–99)
NonHDL: 115.88
TRIGLYCERIDES: 143 mg/dL (ref 0.0–149.0)
Total CHOL/HDL Ratio: 4
VLDL: 28.6 mg/dL (ref 0.0–40.0)

## 2017-07-22 LAB — BASIC METABOLIC PANEL
BUN: 21 mg/dL (ref 6–23)
CO2: 27 meq/L (ref 19–32)
Calcium: 9.4 mg/dL (ref 8.4–10.5)
Chloride: 104 mEq/L (ref 96–112)
Creatinine, Ser: 0.67 mg/dL (ref 0.40–1.20)
GFR: 91.73 mL/min (ref >60.00)
Glucose, Bld: 114 mg/dL — ABNORMAL HIGH (ref 70–99)
Potassium: 4.3 mEq/L (ref 3.5–5.1)
SODIUM: 139 meq/L (ref 135–145)

## 2017-07-22 LAB — TSH: TSH: 2.93 u[IU]/mL (ref 0.35–4.50)

## 2017-07-22 LAB — FERRITIN: Ferritin: 19.4 ng/mL (ref 10.0–291.0)

## 2017-07-22 LAB — HEMOGLOBIN A1C: Hgb A1c MFr Bld: 6.2 % (ref 4.6–6.5)

## 2017-07-23 ENCOUNTER — Encounter: Payer: Self-pay | Admitting: Internal Medicine

## 2017-07-25 ENCOUNTER — Ambulatory Visit (INDEPENDENT_AMBULATORY_CARE_PROVIDER_SITE_OTHER): Payer: Medicare HMO | Admitting: Internal Medicine

## 2017-07-25 ENCOUNTER — Encounter: Payer: Self-pay | Admitting: Internal Medicine

## 2017-07-25 VITALS — BP 118/66 | HR 85 | Temp 97.9°F | Ht 61.0 in | Wt 172.2 lb

## 2017-07-25 DIAGNOSIS — I10 Essential (primary) hypertension: Secondary | ICD-10-CM

## 2017-07-25 DIAGNOSIS — Z6832 Body mass index (BMI) 32.0-32.9, adult: Secondary | ICD-10-CM | POA: Diagnosis not present

## 2017-07-25 DIAGNOSIS — R739 Hyperglycemia, unspecified: Secondary | ICD-10-CM

## 2017-07-25 DIAGNOSIS — N3281 Overactive bladder: Secondary | ICD-10-CM

## 2017-07-25 DIAGNOSIS — E785 Hyperlipidemia, unspecified: Secondary | ICD-10-CM | POA: Diagnosis not present

## 2017-07-25 DIAGNOSIS — Z0000 Encounter for general adult medical examination without abnormal findings: Secondary | ICD-10-CM

## 2017-07-25 DIAGNOSIS — Z23 Encounter for immunization: Secondary | ICD-10-CM | POA: Diagnosis not present

## 2017-07-25 DIAGNOSIS — R0981 Nasal congestion: Secondary | ICD-10-CM

## 2017-07-25 NOTE — Progress Notes (Signed)
Pre visit review using our clinic review tool, if applicable. No additional management support is needed unless otherwise documented below in the visit note. 

## 2017-07-25 NOTE — Progress Notes (Signed)
[name], female   DOB: 11-25-1945, 72 y.o.   MRN: [id number]   Subjective:    [name], female    DOB: 1946/01/22, 72 y.o.   MRN: [id number]  HPI  Patient here for a complete physical exam.  She reports she is doing well.  Feels good.  Stays active.  No chest pain.  No sob. No acid reflux.  No abdominal pain.  Bowels moving.  No urine change.  Due mammogram.  She will schedule.    Past Medical History:  Diagnosis Date  . Blood in the stool    10 yrs ago  . Diverticulitis   . History of abnormal Pap smear   . History of chicken pox   . History of colon polyps   . Hypercholesterolemia   . Seronegative rheumatoid arthritis [facility])    Past Surgical History:  Procedure Laterality Date  . DILATION AND CURETTAGE OF UTERUS    . LEG / ANKLE SOFT TISSUE BIOPSY  2011   to confirm arthritis   Family History  Problem Relation Age of Onset  . Arthritis Father   . Hyperlipidemia Father   . Heart disease Father   . Hypertension Father   . Diabetes Father   . Pulmonary fibrosis Father   . Arthritis Mother   . Heart disease Mother   . Hyperlipidemia Mother   . Hypertension Mother   . Colon cancer Maternal Aunt   . Colon cancer Maternal Uncle   . Breast cancer Neg Hx    Social History   Socioeconomic History  . Marital status: Divorced    Spouse name: None  . Number of children: None  . Years of education: None  . Highest education level: None  Social Needs  . Financial resource strain: None  . Food insecurity - worry: None  . Food insecurity - inability: None  . Transportation needs - medical: None  . Transportation needs - non-medical: None  Occupational History  . None  Tobacco Use  . Smoking status: Never Smoker  . Smokeless tobacco: Never Used  Substance and Sexual Activity  . Alcohol use: No    Alcohol/week: 0.0 oz  . Drug use: No  . Sexual activity: None  Other Topics Concern  . None  Social History Narrative  . None     Outpatient Encounter Medications as of 07/25/2017  Medication Sig  . aspirin 81 MG tablet Take 81 mg by mouth daily.    . calcium elemental as carbonate (TUMS ULTRA 1000) 400 MG tablet Chew 1,000 mg by mouth daily.    . Chromium Picolinate 500 MCG TABS Take by mouth daily.  . Doxylamine Succinate, Sleep, (SLEEP AID PO) OTC as directed.   Marland Kitchen lisinopril (PRINIVIL,ZESTRIL) 10 MG tablet Take 1 tablet (10 mg total) by mouth daily.  . meloxicam (MOBIC) 15 MG tablet Take 15 mg by mouth daily.  . Multiple Vitamins-Minerals (CENTRUM ULTRA WOMENS PO) Take by mouth.  . nystatin cream (MYCOSTATIN) Apply 1 application topically 2 (two) times daily.  Marland Kitchen oxybutynin (DITROPAN) 5 MG tablet TAKE 1 TABLET BY MOUTH  DAILY (Patient taking differently: TAKE 1 TABLET BY MOUTH AS NEEDED)  . pantoprazole (PROTONIX) 40 MG tablet TAKE 1 TABLET BY MOUTH  DAILY  . simvastatin (ZOCOR) 20 MG tablet TAKE 1 TABLET BY MOUTH  EVERY EVENING  . traMADol (ULTRAM) 50 MG tablet Take 50 mg by mouth every 6 (six) hours as needed for pain.   No facility-administered  encounter medications on file as of 07/25/2017.     Review of Systems  Constitutional: Negative for appetite change and unexpected weight change.  HENT: Negative for congestion and sinus pressure.   Eyes: Negative for pain and visual disturbance.  Respiratory: Negative for cough, chest tightness and shortness of breath.   Cardiovascular: Negative for chest pain, palpitations and leg swelling.  Gastrointestinal: Negative for abdominal pain, diarrhea, nausea and vomiting.  Genitourinary: Negative for difficulty urinating and dysuria.  Musculoskeletal: Negative for back pain and joint swelling.  Skin: Negative for color change and rash.  Neurological: Negative for dizziness, light-headedness and headaches.  Hematological: Negative for adenopathy. Does not bruise/bleed easily.  Psychiatric/Behavioral: Negative for agitation and dysphoric mood.       Objective:     Physical Exam  Constitutional: She is oriented to person, place, and time. She appears well-developed and well-nourished. No distress.  HENT:  Nose: Nose normal.  Mouth/Throat: Oropharynx is clear and moist.  Eyes: Right eye exhibits no discharge. Left eye exhibits no discharge. No scleral icterus.  Neck: Neck supple. No thyromegaly present.  Cardiovascular: Normal rate and regular rhythm.  Pulmonary/Chest: Breath sounds normal. No accessory muscle usage. No tachypnea. No respiratory distress. She has no decreased breath sounds. She has no wheezes. She has no rhonchi. Right breast exhibits no inverted nipple, no mass, no nipple discharge and no tenderness (no axillary adenopathy). Left breast exhibits no inverted nipple, no mass, no nipple discharge and no tenderness (no axilarry adenopathy).  Abdominal: Soft. Bowel sounds are normal. There is no tenderness.  Musculoskeletal: She exhibits no edema or tenderness.  Lymphadenopathy:    She has no cervical adenopathy.  Neurological: She is alert and oriented to person, place, and time.  Skin: Skin is warm. No rash noted. No erythema.  Psychiatric: She has a normal mood and affect. Her behavior is normal.    BP 118/66   Pulse 85   Temp 97.9 F (36.6 C) (Oral)   Ht _0  (1.549 m)   Wt 172 lb 3.2 oz (78.1 kg)   LMP 07/16/1996   SpO2 96%   BMI 32.54 kg/m  Wt Readings from Last 3 Encounters:  07/25/17 172 lb 3.2 oz (78.1 kg)  03/25/17 169 lb 3.2 oz (76.7 kg)  12/17/16 171 lb 12.8 oz (77.9 kg)     Lab Results  Component Value Date   WBC 6.6 07/22/2017   HGB 15.4 (H) 07/22/2017   HCT 46.7 (H) 07/22/2017   PLT 435.0 (H) 07/22/2017   GLUCOSE 114 (H) 07/22/2017   CHOL 154 07/22/2017   TRIG 143.0 07/22/2017   HDL 38.60 (L) 07/22/2017   LDLDIRECT 153.7 11/15/2010   LDLCALC 87 07/22/2017   ALT 28 07/22/2017   AST 23 07/22/2017   NA 139 07/22/2017   K 4.3 07/22/2017   CL 104 07/22/2017   CREATININE 0.67 07/22/2017   BUN 21  07/22/2017   CO2 27 07/22/2017   TSH 2.93 07/22/2017   INR 1.1 (H) 06/21/2016   HGBA1C 6.2 07/22/2017    Mm Screening Breast Tomo Bilateral  Result Date: 09/26/2016 CLINICAL DATA:  Screening. EXAM: 2D DIGITAL SCREENING BILATERAL MAMMOGRAM WITH CAD AND ADJUNCT TOMO COMPARISON:  Previous exam(s). ACR Breast Density Category b: There are scattered areas of fibroglandular density. FINDINGS: There are no findings suspicious for malignancy. Images were processed with CAD. IMPRESSION: No mammographic evidence of malignancy. A result letter of this screening mammogram will be mailed directly to the patient. RECOMMENDATION: Screening mammogram in  one year. (Code:SM-B-01Y) BI-RADS CATEGORY  1: Negative. Electronically Signed   By: Lillia Mountain M.D.   On: 09/26/2016 15:22       Assessment & Plan:   Problem List Items Addressed This Visit    BMI 32.0-32.9,adult    Diet and exercise.  Follow.        Health care maintenance    Physical today 07/25/17.  Mammogram 09/26/16 - Birads I.  Colonoscopy 2010.  Due f/u in 2020.        Hyperglycemia    Low carb diet and exercise.  Follow met b and a1c.        Relevant Orders   Hemoglobin A1c   Hyperlipidemia    Low cholesterol diet and exercise.  Follow lipid panel and liver function tests.  On simvastatin.        Relevant Orders   Lipid panel   Hepatic function panel   Hypertension    Blood pressure under good control.  Continue same medication regimen.  Follow pressures.  Follow metabolic panel.        Relevant Orders   CBC with Differential/Platelet   Basic metabolic panel   Overactive bladder    Ditropan.        Other Visit Diagnoses    Routine general medical examination at a health care facility    -  Primary   Nasal congestion       saline nasal spray and nasacort nasal spray as outlined.        Einar Pheasant, MD

## 2017-07-25 NOTE — Assessment & Plan Note (Signed)
Physical today 07/25/17.  Mammogram 09/26/16 - Birads I.  Colonoscopy 2010.  Due f/u in 2020.

## 2017-07-25 NOTE — Patient Instructions (Signed)
Saline nasal spray - flush nose at least 2-3x/day  nasacort nasal spray - 2 sprays each nostril one time per day.  Do this in the evening.  

## 2017-07-26 ENCOUNTER — Telehealth: Payer: Self-pay

## 2017-07-26 NOTE — Telephone Encounter (Signed)
Ear ring was not found in room.

## 2017-07-26 NOTE — Telephone Encounter (Signed)
Copied from [place] [phone]. Topic: Inquiry >> Jul 26, 2017  9:53 AM Arletha Grippe wrote: Reason for CRM: pt was in yesterday and saw dr scott.  She lost her silver earring and would like a call if it is found.  Cb# [phone]

## 2017-07-26 NOTE — Telephone Encounter (Signed)
L/m letting her know

## 2017-07-28 ENCOUNTER — Encounter: Payer: Self-pay | Admitting: Internal Medicine

## 2017-07-28 NOTE — Assessment & Plan Note (Signed)
Ditropan

## 2017-07-28 NOTE — Assessment & Plan Note (Signed)
Blood pressure under good control.  Continue same medication regimen.  Follow pressures.  Follow metabolic panel.   

## 2017-07-28 NOTE — Assessment & Plan Note (Signed)
Low carb diet and exercise.  Follow met b and a1c.   

## 2017-07-28 NOTE — Assessment & Plan Note (Signed)
Low cholesterol diet and exercise.  Follow lipid panel and liver function tests.  On simvastatin.   

## 2017-07-28 NOTE — Assessment & Plan Note (Signed)
Diet and exercise.  Follow.  

## 2017-08-02 DIAGNOSIS — M5136 Other intervertebral disc degeneration, lumbar region: Secondary | ICD-10-CM | POA: Diagnosis not present

## 2017-08-02 DIAGNOSIS — M47816 Spondylosis without myelopathy or radiculopathy, lumbar region: Secondary | ICD-10-CM | POA: Diagnosis not present

## 2017-08-07 DIAGNOSIS — R69 Illness, unspecified: Secondary | ICD-10-CM | POA: Diagnosis not present

## 2017-08-13 ENCOUNTER — Encounter: Payer: Self-pay | Admitting: Internal Medicine

## 2017-08-15 ENCOUNTER — Other Ambulatory Visit: Payer: Self-pay

## 2017-08-15 ENCOUNTER — Encounter: Payer: Self-pay | Admitting: Internal Medicine

## 2017-08-15 MED ORDER — SIMVASTATIN 20 MG PO TABS: 20.0000 mg | ORAL_TABLET | Freq: Every evening | ORAL | 1 refills | Status: DC

## 2017-08-15 MED ORDER — PANTOPRAZOLE SODIUM 40 MG PO TBEC: 40.0000 mg | DELAYED_RELEASE_TABLET | Freq: Every day | ORAL | 1 refills | Status: DC

## 2017-08-15 MED ORDER — MELOXICAM 15 MG PO TABS: 15.0000 mg | ORAL_TABLET | Freq: Every day | ORAL | 0 refills | Status: DC

## 2017-08-15 MED ORDER — LISINOPRIL 10 MG PO TABS
10.0000 mg | ORAL_TABLET | Freq: Every day | ORAL | 1 refills | Status: DC
Start: 2017-08-15 — End: 2018-02-01

## 2017-08-15 NOTE — Telephone Encounter (Signed)
Ok

## 2017-09-06 ENCOUNTER — Other Ambulatory Visit: Payer: Self-pay | Admitting: Internal Medicine

## 2017-09-06 DIAGNOSIS — Z1231 Encounter for screening mammogram for malignant neoplasm of breast: Secondary | ICD-10-CM

## 2017-10-02 ENCOUNTER — Ambulatory Visit
Admission: RE | Admit: 2017-10-02 | Discharge: 2017-10-02 | Disposition: A | Discharge status: Home / Self Care | Payer: Medicare HMO | Source: Ambulatory Visit | Attending: Internal Medicine | Admitting: Internal Medicine

## 2017-10-02 DIAGNOSIS — Z1231 Encounter for screening mammogram for malignant neoplasm of breast: Secondary | ICD-10-CM | POA: Insufficient documentation

## 2017-10-11 ENCOUNTER — Ambulatory Visit: Payer: Self-pay

## 2017-10-11 ENCOUNTER — Encounter: Payer: Self-pay | Admitting: Podiatry

## 2017-10-25 ENCOUNTER — Ambulatory Visit: Payer: Self-pay | Admitting: Podiatry

## 2017-10-27 ENCOUNTER — Other Ambulatory Visit: Payer: Self-pay | Admitting: Internal Medicine

## 2017-11-01 NOTE — Progress Notes (Signed)
This encounter was created in error - please disregard.

## 2017-11-12 ENCOUNTER — Ambulatory Visit: Payer: Medicare HMO | Admitting: Podiatry

## 2017-11-12 ENCOUNTER — Ambulatory Visit (INDEPENDENT_AMBULATORY_CARE_PROVIDER_SITE_OTHER): Payer: Medicare HMO

## 2017-11-12 ENCOUNTER — Encounter: Payer: Self-pay | Admitting: Podiatry

## 2017-11-12 DIAGNOSIS — M2041 Other hammer toe(s) (acquired), right foot: Secondary | ICD-10-CM

## 2017-11-12 DIAGNOSIS — M21619 Bunion of unspecified foot: Secondary | ICD-10-CM | POA: Diagnosis not present

## 2017-11-12 DIAGNOSIS — M2042 Other hammer toe(s) (acquired), left foot: Secondary | ICD-10-CM

## 2017-11-12 NOTE — Patient Instructions (Signed)
Pre-Operative Instructions  Congratulations, you have decided to take an important step towards improving your quality of life.  You can be assured that the doctors and staff at [facility] will be with you every step of the way.  Here are some important things you should know:  1. Plan to be at the surgery center/hospital at least 1 (one) hour prior to your scheduled time, unless otherwise directed by the surgical center/hospital staff.  You must have a responsible adult accompany you, remain during the surgery and drive you home.  Make sure you have directions to the surgical center/hospital to ensure you arrive on time. 2. If you are having surgery at [facility] or [facility], you will need a copy of your medical history and physical form from your family physician within one month prior to the date of surgery. We will give you a form for your primary physician to complete.  3. We make every effort to accommodate the date you request for surgery.  However, there are times where surgery dates or times have to be moved.  We will contact you as soon as possible if a change in schedule is required.   4. No aspirin/ibuprofen for one week before surgery.  If you are on aspirin, any non-steroidal anti-inflammatory medications (Mobic, Aleve, Ibuprofen) should not be taken seven (7) days prior to your surgery.  You make take Tylenol for pain prior to surgery.  5. Medications - If you are taking daily heart and blood pressure medications, seizure, reflux, allergy, asthma, anxiety, pain or diabetes medications, make sure you notify the surgery center/hospital before the day of surgery so they can tell you which medications you should take or avoid the day of surgery. 6. No food or drink after midnight the night before surgery unless directed otherwise by surgical center/hospital staff. 7. No alcoholic beverages 24-hours prior to surgery.  No smoking 24-hours prior or 24-hours after  surgery. 8. Wear loose pants or shorts. They should be loose enough to fit over bandages, boots, and casts. 9. Don't wear slip-on shoes. Sneakers are preferred. 10. Bring your boot with you to the surgery center/hospital.  Also bring crutches or a walker if your physician has prescribed it for you.  If you do not have this equipment, it will be provided for you after surgery. 11. If you have not been contacted by the surgery center/hospital by the day before your surgery, call to confirm the date and time of your surgery. 12. Leave-time from work may vary depending on the type of surgery you have.  Appropriate arrangements should be made prior to surgery with your employer. 13. Prescriptions will be provided immediately following surgery by your doctor.  Fill these as soon as possible after surgery and take the medication as directed. Pain medications will not be refilled on weekends and must be approved by the doctor. 14. Remove nail polish on the operative foot and avoid getting pedicures prior to surgery. 15. Wash the night before surgery.  The night before surgery wash the foot and leg well with water and the antibacterial soap provided. Be sure to pay special attention to beneath the toenails and in between the toes.  Wash for at least three (3) minutes. Rinse thoroughly with water and dry well with a towel.  Perform this wash unless told not to do so by your physician.  Enclosed: 1 Ice pack (please put in freezer the night before surgery)   1 Hibiclens skin cleaner     Pre-op instructions  If you have any questions regarding the instructions, please do not hesitate to call our office.  [place]: [address], [place], [place] [address] -- [phone]  [place]: [address] [place], [place] [address] -- [phone]  [place]: [address]  [place], [place] [address] -- [phone]  [place]: [address], [address], [place], [place] [address] -- [phone]  Website:  [email or web address] 

## 2017-11-14 NOTE — Progress Notes (Signed)
   Subjective: 72 year old female presenting today as a new patient with a chief complaint of painful bunions noted to bilateral feet that have been present for the past 5 years. She also reports painful hammertoes of bilateral hammertoes, left worse than right. Walking and wearing certain shoes increases the pain. She has not done anything for treatment. Patient is here for further evaluation and treatment.   Past Medical History:  Diagnosis Date  . Blood in the stool    10 yrs ago  . Diverticulitis   . History of abnormal Pap smear   . History of chicken pox   . History of colon polyps   . Hypercholesterolemia   . Seronegative rheumatoid arthritis ([place])      Objective: Physical Exam General: The patient is alert and oriented x3 in no acute distress.  Dermatology: Skin is cool, dry and supple bilateral lower extremities. Negative for open lesions or macerations.  Vascular: Palpable pedal pulses bilaterally. No edema or erythema noted. Capillary refill within normal limits.  Neurological: Epicritic and protective threshold grossly intact bilaterally.   Musculoskeletal Exam: Clinical evidence of bunion deformity noted to the respective foot. There is a moderate pain on palpation range of motion of the first MPJ. Lateral deviation of the hallux noted consistent with hallux abductovalgus. Hammertoe contracture also noted on clinical exam to the 2nd digits of the bilateral feet. Symptomatic pain on palpation and range of motion also noted to the metatarsal phalangeal joints of the respective hammertoe digits.    Radiographic Exam: Increased intermetatarsal angle greater than 15 with a hallux abductus angle greater than 30 noted on AP view. Moderate degenerative changes noted within the first MPJ. Contracture deformity also noted to the interphalangeal joints and MPJs of the digits of the respective hammertoes.    Assessment: 1. HAV w/ bunion deformity bilateral, left greater than  right 2. Hammertoe deformity 2nd digit bilateral, left greater than right     Plan of Care:  1. Patient was evaluated. X-Rays reviewed. 2. Today we discussed the conservative versus surgical management of the presenting pathology. The patient opts for surgical management. All possible complications and details of the procedure were explained. All patient questions were answered. No guarantees were expressed or implied. 3. Authorization for surgery was initiated today. Surgery will consist of bunionectomy with osteotomy left, PIPJ arthroplasty with MPJ capsulotomy 2nd left, possible weil osteotomy 2nd left.  4. CAM boot dispensed.  5. Return to clinic one week post op.    Edrick Kins, DPM [facility]  Dr. Edrick Kins, [place]                                        [place], [place] [address]                Office [phone]  Fax [phone]

## 2017-11-19 ENCOUNTER — Encounter: Payer: Self-pay | Admitting: Internal Medicine

## 2017-11-19 NOTE — Telephone Encounter (Signed)
Agree with need for evaluation to know for sure how to treat.

## 2017-11-19 NOTE — Telephone Encounter (Signed)
Advised patient that she should be evaluated due to you being out of the office. Offered walk-in clinic and appointment with Almyra Free tomorrow. Patient refused both, she is wanting something called in for her to take.

## 2017-11-26 ENCOUNTER — Other Ambulatory Visit (INDEPENDENT_AMBULATORY_CARE_PROVIDER_SITE_OTHER): Payer: Medicare HMO

## 2017-11-26 DIAGNOSIS — R739 Hyperglycemia, unspecified: Secondary | ICD-10-CM

## 2017-11-26 DIAGNOSIS — E785 Hyperlipidemia, unspecified: Secondary | ICD-10-CM | POA: Diagnosis not present

## 2017-11-26 DIAGNOSIS — I10 Essential (primary) hypertension: Secondary | ICD-10-CM | POA: Diagnosis not present

## 2017-11-26 LAB — HEPATIC FUNCTION PANEL
ALK PHOS: 75 U/L (ref 39–117)
ALT: 19 U/L (ref 0–35)
AST: 18 U/L (ref 0–37)
Albumin: 4 g/dL (ref 3.5–5.2)
BILIRUBIN DIRECT: 0.1 mg/dL (ref 0.0–0.3)
BILIRUBIN TOTAL: 0.3 mg/dL (ref 0.2–1.2)
Total Protein: 7.5 g/dL (ref 6.0–8.3)

## 2017-11-26 LAB — LIPID PANEL
CHOLESTEROL: 135 mg/dL (ref 0–200)
HDL: 38 mg/dL — ABNORMAL LOW (ref >39.00)
LDL Cholesterol: 65 mg/dL (ref 0–99)
NonHDL: 96.84
TRIGLYCERIDES: 160 mg/dL — AB (ref 0.0–149.0)
Total CHOL/HDL Ratio: 4
VLDL: 32 mg/dL (ref 0.0–40.0)

## 2017-11-26 LAB — CBC WITH DIFFERENTIAL/PLATELET
BASOS PCT: 1 % (ref 0.0–3.0)
Basophils Absolute: 0.1 10*3/uL (ref 0.0–0.1)
EOS ABS: 0.2 10*3/uL (ref 0.0–0.7)
Eosinophils Relative: 4 % (ref 0.0–5.0)
HCT: 45.3 % (ref 36.0–46.0)
Hemoglobin: 15.3 g/dL — ABNORMAL HIGH (ref 12.0–15.0)
LYMPHS ABS: 1.8 10*3/uL (ref 0.7–4.0)
Lymphocytes Relative: 28.2 % (ref 12.0–46.0)
MCHC: 33.7 g/dL (ref 30.0–36.0)
MCV: 87.8 fl (ref 78.0–100.0)
MONO ABS: 0.5 10*3/uL (ref 0.1–1.0)
Monocytes Relative: 8.3 % (ref 3.0–12.0)
NEUTROS ABS: 3.7 10*3/uL (ref 1.4–7.7)
NEUTROS PCT: 58.5 % (ref 43.0–77.0)
PLATELETS: 438 10*3/uL — AB (ref 150.0–400.0)
RBC: 5.16 Mil/uL — ABNORMAL HIGH (ref 3.87–5.11)
RDW: 13.9 % (ref 11.5–15.5)
WBC: 6.2 10*3/uL (ref 4.0–10.5)

## 2017-11-26 LAB — BASIC METABOLIC PANEL
BUN: 20 mg/dL (ref 6–23)
CHLORIDE: 106 meq/L (ref 96–112)
CO2: 28 mEq/L (ref 19–32)
Calcium: 9.7 mg/dL (ref 8.4–10.5)
Creatinine, Ser: 0.73 mg/dL (ref 0.40–1.20)
GFR: 83.01 mL/min (ref >60.00)
Glucose, Bld: 121 mg/dL — ABNORMAL HIGH (ref 70–99)
POTASSIUM: 3.8 meq/L (ref 3.5–5.1)
Sodium: 142 mEq/L (ref 135–145)

## 2017-11-26 LAB — HEMOGLOBIN A1C: HEMOGLOBIN A1C: 6.2 % (ref 4.6–6.5)

## 2017-11-27 ENCOUNTER — Encounter: Payer: Self-pay | Admitting: Internal Medicine

## 2017-11-28 ENCOUNTER — Ambulatory Visit (INDEPENDENT_AMBULATORY_CARE_PROVIDER_SITE_OTHER): Payer: Medicare HMO | Admitting: Internal Medicine

## 2017-11-28 VITALS — BP 130/78 | HR 106 | Temp 98.2°F | Resp 18 | Wt 172.0 lb

## 2017-11-28 DIAGNOSIS — M47817 Spondylosis without myelopathy or radiculopathy, lumbosacral region: Secondary | ICD-10-CM | POA: Diagnosis not present

## 2017-11-28 DIAGNOSIS — D75839 Thrombocytosis, unspecified: Secondary | ICD-10-CM

## 2017-11-28 DIAGNOSIS — Z6832 Body mass index (BMI) 32.0-32.9, adult: Secondary | ICD-10-CM

## 2017-11-28 DIAGNOSIS — R739 Hyperglycemia, unspecified: Secondary | ICD-10-CM | POA: Diagnosis not present

## 2017-11-28 DIAGNOSIS — D473 Essential (hemorrhagic) thrombocythemia: Secondary | ICD-10-CM | POA: Diagnosis not present

## 2017-11-28 DIAGNOSIS — M25562 Pain in left knee: Secondary | ICD-10-CM | POA: Diagnosis not present

## 2017-11-28 DIAGNOSIS — M25561 Pain in right knee: Secondary | ICD-10-CM | POA: Diagnosis not present

## 2017-11-28 DIAGNOSIS — I10 Essential (primary) hypertension: Secondary | ICD-10-CM | POA: Diagnosis not present

## 2017-11-28 DIAGNOSIS — Z1159 Encounter for screening for other viral diseases: Secondary | ICD-10-CM

## 2017-11-28 DIAGNOSIS — E785 Hyperlipidemia, unspecified: Secondary | ICD-10-CM

## 2017-11-28 DIAGNOSIS — M170 Bilateral primary osteoarthritis of knee: Secondary | ICD-10-CM | POA: Diagnosis not present

## 2017-11-28 NOTE — Progress Notes (Signed)
Patient ID: MYRLENE RIERA, female   DOB: 12-Jul-1946, 72 y.o.   MRN: [id number]   Subjective:    Patient ID: Leilani Able, female    DOB: [birth year], 72 y.o.   MRN: [id number]  HPI  Patient here for a scheduled follow up. She reports she is doing relatively well.  Tries to stay active.  No chest pain.  Breathing stable.  No acid reflux reported.  No abdominal pain.  Bowels moving.  Sees Dr Sharlet Salina.  Evaluated 07/2017.  Felt stable.  Recommended continuing tramadol and mobic.  Overall stable.  Discussed lab results.  Discussed low carb diet and exercise.     Past Medical History:  Diagnosis Date  . Blood in the stool    10 yrs ago  . Diverticulitis   . History of abnormal Pap smear   . History of chicken pox   . History of colon polyps   . Hypercholesterolemia   . Seronegative rheumatoid arthritis [facility])    Past Surgical History:  Procedure Laterality Date  . DILATION AND CURETTAGE OF UTERUS    . LEG / ANKLE SOFT TISSUE BIOPSY  2011   to confirm arthritis   Family History  Problem Relation Age of Onset  . Arthritis Father   . Hyperlipidemia Father   . Heart disease Father   . Hypertension Father   . Diabetes Father   . Pulmonary fibrosis Father   . Arthritis Mother   . Heart disease Mother   . Hyperlipidemia Mother   . Hypertension Mother   . Colon cancer Maternal Aunt   . Colon cancer Maternal Uncle   . Breast cancer Neg Hx    Social History   Socioeconomic History  . Marital status: Divorced    Spouse name: Not on file  . Number of children: Not on file  . Years of education: Not on file  . Highest education level: Not on file  Occupational History  . Not on file  Social Needs  . Financial resource strain: Not on file  . Food insecurity:    Worry: Not on file    Inability: Not on file  . Transportation needs:    Medical: Not on file    Non-medical: Not on file  Tobacco Use  . Smoking status: Never Smoker  . Smokeless tobacco: Never Used  Substance and Sexual  Activity  . Alcohol use: No    Alcohol/week: 0.0 oz  . Drug use: No  . Sexual activity: Not on file  Lifestyle  . Physical activity:    Days per week: Not on file    Minutes per session: Not on file  . Stress: Not on file  Relationships  . Social connections:    Talks on phone: Not on file    Gets together: Not on file    Attends religious service: Not on file    Active member of club or organization: Not on file    Attends meetings of clubs or organizations: Not on file    Relationship status: Not on file  Other Topics Concern  . Not on file  Social History Narrative  . Not on file    Outpatient Encounter Medications as of 11/28/2017  Medication Sig  . calcium elemental as carbonate (TUMS ULTRA 1000) 400 MG tablet Chew 1,000 mg by mouth daily.    . Chromium Picolinate 500 MCG TABS Take by mouth daily.  . Doxylamine Succinate, Sleep, (SLEEP AID PO) OTC as directed.   Marland Kitchen lisinopril (  PRINIVIL,ZESTRIL) 10 MG tablet Take 1 tablet (10 mg total) by mouth daily.  . meloxicam (MOBIC) 15 MG tablet TAKE 1 TABLET DAILY  . Multiple Vitamins-Minerals (CENTRUM ULTRA WOMENS PO) Take by mouth.  . nystatin cream (MYCOSTATIN) Apply 1 application topically 2 (two) times daily.  Marland Kitchen oxybutynin (DITROPAN) 5 MG tablet TAKE 1 TABLET BY MOUTH  DAILY (Patient taking differently: TAKE 1 TABLET BY MOUTH AS NEEDED)  . pantoprazole (PROTONIX) 40 MG tablet Take 1 tablet (40 mg total) by mouth daily.  . simvastatin (ZOCOR) 20 MG tablet Take 1 tablet (20 mg total) by mouth every evening.  . traMADol (ULTRAM) 50 MG tablet 1-2 po bid prn  . [DISCONTINUED] aspirin 81 MG tablet Take 81 mg by mouth daily.    . [DISCONTINUED] traMADol (ULTRAM) 50 MG tablet Take 50 mg by mouth every 6 (six) hours as needed for pain.   No facility-administered encounter medications on file as of 11/28/2017.     Review of Systems  Constitutional: Negative for appetite change and unexpected weight change.  HENT: Negative for  congestion and sinus pressure.   Respiratory: Negative for cough, chest tightness and shortness of breath.   Cardiovascular: Negative for chest pain, palpitations and leg swelling.  Gastrointestinal: Negative for abdominal pain, diarrhea and nausea.  Genitourinary: Negative for difficulty urinating and dysuria.  Musculoskeletal: Negative for myalgias.       Chronic back pain.  Stable.   Skin: Negative for color change and rash.  Neurological: Negative for dizziness, light-headedness and headaches.  Psychiatric/Behavioral: Negative for agitation and dysphoric mood.       Objective:     Blood pressure rechecked by me:  120/78.  Pulse recheck:  92  Physical Exam  Constitutional: She appears well-developed and well-nourished. No distress.  HENT:  Nose: Nose normal.  Mouth/Throat: Oropharynx is clear and moist.  Neck: Neck supple. No thyromegaly present.  Cardiovascular: Normal rate and regular rhythm.  Pulmonary/Chest: Breath sounds normal. No respiratory distress. She has no wheezes.  Abdominal: Soft. Bowel sounds are normal. There is no tenderness.  Musculoskeletal: She exhibits no edema or tenderness.  Lymphadenopathy:    She has no cervical adenopathy.  Skin: No rash noted. No erythema.  Psychiatric: She has a normal mood and affect. Her behavior is normal.    BP 130/78 (BP Location: Right Arm, Patient Position: Sitting, Cuff Size: Normal)   Pulse (!) 106   Temp 98.2 F (36.8 C) (Oral)   Resp 18   Wt 172 lb (78 kg)   LMP 07/16/1996   SpO2 96%   BMI 32.50 kg/m  Wt Readings from Last 3 Encounters:  11/28/17 172 lb (78 kg)  07/25/17 172 lb 3.2 oz (78.1 kg)  03/25/17 169 lb 3.2 oz (76.7 kg)     Lab Results  Component Value Date   WBC 6.2 11/26/2017   HGB 15.3 (H) 11/26/2017   HCT 45.3 11/26/2017   PLT 438.0 (H) 11/26/2017   GLUCOSE 121 (H) 11/26/2017   CHOL 135 11/26/2017   TRIG 160.0 (H) 11/26/2017   HDL 38.00 (L) 11/26/2017   LDLDIRECT 153.7 11/15/2010    LDLCALC 65 11/26/2017   ALT 19 11/26/2017   AST 18 11/26/2017   NA 142 11/26/2017   K 3.8 11/26/2017   CL 106 11/26/2017   CREATININE 0.73 11/26/2017   BUN 20 11/26/2017   CO2 28 11/26/2017   TSH 2.93 07/22/2017   INR 1.1 (H) 06/21/2016   HGBA1C 6.2 11/26/2017    Mm  Screening Breast Tomo Bilateral  Result Date: 10/02/2017 CLINICAL DATA:  Screening. EXAM: DIGITAL SCREENING BILATERAL MAMMOGRAM WITH TOMO AND CAD COMPARISON:  Previous exam(s). ACR Breast Density Category b: There are scattered areas of fibroglandular density. FINDINGS: There are no findings suspicious for malignancy. Images were processed with CAD. IMPRESSION: No mammographic evidence of malignancy. A result letter of this screening mammogram will be mailed directly to the patient. RECOMMENDATION: Screening mammogram in one year. (Code:SM-B-01Y) BI-RADS CATEGORY  1: Negative. Electronically Signed   By: Fidela Salisbury M.D.   On: 10/02/2017 17:26       Assessment & Plan:   Problem List Items Addressed This Visit    BMI 32.0-32.9,adult    Discussed diet and exercise.  Follow.        Hyperglycemia    Low carb diet and exercise.  Follow met b and a1c.   Lab Results  Component Value Date   HGBA1C 6.2 11/26/2017        Relevant Orders   Hemoglobin A1c   Hyperlipidemia    Low cholesterol diet and exercise.  Follow lipid panel and liver function tests.  On simvastatin.  Triglycerides increased slightly.  Discussed low carb diet.   Lab Results  Component Value Date   CHOL 135 11/26/2017   HDL 38.00 (L) 11/26/2017   LDLCALC 65 11/26/2017   LDLDIRECT 153.7 11/15/2010   TRIG 160.0 (H) 11/26/2017   CHOLHDL 4 11/26/2017        Relevant Orders   Hepatic function panel   Lipid panel   Hypertension    Blood pressure under good control.  Continue same medication regimen.  Follow pressures.  Follow metabolic panel.        Relevant Orders   Basic metabolic panel   Lumbosacral spondylosis    Followed by Dr  Sharlet Salina.  Last evaluated 07/2017.  Stable.  On mobic and tramadol.        Thrombocytosis (HCC) - Primary    Platelet count stable.  Still slightly increased, but stable.  Follow.  Recheck cbc with next labs.        Relevant Orders   CBC with Differential/Platelet    Other Visit Diagnoses    Need for hepatitis C screening test       Relevant Orders   Hepatitis C antibody       Einar Pheasant, MD

## 2017-12-09 ENCOUNTER — Encounter: Payer: Self-pay | Admitting: Internal Medicine

## 2017-12-09 DIAGNOSIS — D473 Essential (hemorrhagic) thrombocythemia: Secondary | ICD-10-CM | POA: Insufficient documentation

## 2017-12-09 DIAGNOSIS — D75839 Thrombocytosis, unspecified: Secondary | ICD-10-CM | POA: Insufficient documentation

## 2017-12-09 NOTE — Assessment & Plan Note (Signed)
Discussed diet and exercise.  Follow.  

## 2017-12-09 NOTE — Assessment & Plan Note (Addendum)
Low cholesterol diet and exercise.  Follow lipid panel and liver function tests.  On simvastatin.  Triglycerides increased slightly.  Discussed low carb diet.   Lab Results  Component Value Date   CHOL 135 11/26/2017   HDL 38.00 (L) 11/26/2017   LDLCALC 65 11/26/2017   LDLDIRECT 153.7 11/15/2010   TRIG 160.0 (H) 11/26/2017   CHOLHDL 4 11/26/2017

## 2017-12-09 NOTE — Assessment & Plan Note (Signed)
Platelet count stable.  Still slightly increased, but stable.  Follow.  Recheck cbc with next labs.

## 2017-12-09 NOTE — Assessment & Plan Note (Signed)
Blood pressure under good control.  Continue same medication regimen.  Follow pressures.  Follow metabolic panel.   

## 2017-12-09 NOTE — Assessment & Plan Note (Signed)
Low carb diet and exercise.  Follow met b and a1c.   Lab Results  Component Value Date   HGBA1C 6.2 11/26/2017

## 2017-12-09 NOTE — Assessment & Plan Note (Signed)
Followed by Dr Sharlet Salina.  Last evaluated 07/2017.  Stable.  On mobic and tramadol.

## 2018-01-27 DIAGNOSIS — H25013 Cortical age-related cataract, bilateral: Secondary | ICD-10-CM | POA: Diagnosis not present

## 2018-01-27 DIAGNOSIS — H2513 Age-related nuclear cataract, bilateral: Secondary | ICD-10-CM | POA: Diagnosis not present

## 2018-01-31 DIAGNOSIS — M47816 Spondylosis without myelopathy or radiculopathy, lumbar region: Secondary | ICD-10-CM | POA: Diagnosis not present

## 2018-01-31 DIAGNOSIS — M5136 Other intervertebral disc degeneration, lumbar region: Secondary | ICD-10-CM | POA: Diagnosis not present

## 2018-02-01 ENCOUNTER — Other Ambulatory Visit: Payer: Self-pay | Admitting: Internal Medicine

## 2018-02-11 DIAGNOSIS — R69 Illness, unspecified: Secondary | ICD-10-CM | POA: Diagnosis not present

## 2018-03-03 DIAGNOSIS — M47816 Spondylosis without myelopathy or radiculopathy, lumbar region: Secondary | ICD-10-CM | POA: Diagnosis not present

## 2018-04-02 ENCOUNTER — Other Ambulatory Visit (INDEPENDENT_AMBULATORY_CARE_PROVIDER_SITE_OTHER): Payer: Medicare HMO

## 2018-04-02 DIAGNOSIS — D473 Essential (hemorrhagic) thrombocythemia: Secondary | ICD-10-CM | POA: Diagnosis not present

## 2018-04-02 DIAGNOSIS — R739 Hyperglycemia, unspecified: Secondary | ICD-10-CM | POA: Diagnosis not present

## 2018-04-02 DIAGNOSIS — D75839 Thrombocytosis, unspecified: Secondary | ICD-10-CM

## 2018-04-02 DIAGNOSIS — I10 Essential (primary) hypertension: Secondary | ICD-10-CM

## 2018-04-02 DIAGNOSIS — E785 Hyperlipidemia, unspecified: Secondary | ICD-10-CM

## 2018-04-02 DIAGNOSIS — Z1159 Encounter for screening for other viral diseases: Secondary | ICD-10-CM

## 2018-04-02 LAB — HEPATIC FUNCTION PANEL
ALBUMIN: 4.2 g/dL (ref 3.5–5.2)
ALT: 23 U/L (ref 0–35)
AST: 22 U/L (ref 0–37)
Alkaline Phosphatase: 73 U/L (ref 39–117)
Bilirubin, Direct: 0.1 mg/dL (ref 0.0–0.3)
Total Bilirubin: 0.5 mg/dL (ref 0.2–1.2)
Total Protein: 7.3 g/dL (ref 6.0–8.3)

## 2018-04-02 LAB — BASIC METABOLIC PANEL
BUN: 19 mg/dL (ref 6–23)
CHLORIDE: 106 meq/L (ref 96–112)
CO2: 28 meq/L (ref 19–32)
CREATININE: 0.67 mg/dL (ref 0.40–1.20)
Calcium: 9.9 mg/dL (ref 8.4–10.5)
GFR: 91.56 mL/min (ref >60.00)
Glucose, Bld: 106 mg/dL — ABNORMAL HIGH (ref 70–99)
POTASSIUM: 4 meq/L (ref 3.5–5.1)
Sodium: 140 mEq/L (ref 135–145)

## 2018-04-02 LAB — CBC WITH DIFFERENTIAL/PLATELET
BASOS ABS: 0.1 10*3/uL (ref 0.0–0.1)
Basophils Relative: 1.2 % (ref 0.0–3.0)
Eosinophils Absolute: 0.2 10*3/uL (ref 0.0–0.7)
Eosinophils Relative: 4.1 % (ref 0.0–5.0)
HEMATOCRIT: 45.3 % (ref 36.0–46.0)
HEMOGLOBIN: 15.2 g/dL — AB (ref 12.0–15.0)
LYMPHS PCT: 31.1 % (ref 12.0–46.0)
Lymphs Abs: 1.7 10*3/uL (ref 0.7–4.0)
MCHC: 33.6 g/dL (ref 30.0–36.0)
MCV: 89.3 fl (ref 78.0–100.0)
MONO ABS: 0.4 10*3/uL (ref 0.1–1.0)
Monocytes Relative: 7.8 % (ref 3.0–12.0)
Neutro Abs: 3.1 10*3/uL (ref 1.4–7.7)
Neutrophils Relative %: 55.8 % (ref 43.0–77.0)
Platelets: 451 10*3/uL — ABNORMAL HIGH (ref 150.0–400.0)
RBC: 5.07 Mil/uL (ref 3.87–5.11)
RDW: 14 % (ref 11.5–15.5)
WBC: 5.5 10*3/uL (ref 4.0–10.5)

## 2018-04-02 LAB — LIPID PANEL
CHOL/HDL RATIO: 4
CHOLESTEROL: 151 mg/dL (ref 0–200)
HDL: 35.2 mg/dL — ABNORMAL LOW (ref >39.00)
LDL CALC: 87 mg/dL (ref 0–99)
NonHDL: 115.64
TRIGLYCERIDES: 145 mg/dL (ref 0.0–149.0)
VLDL: 29 mg/dL (ref 0.0–40.0)

## 2018-04-02 LAB — HEMOGLOBIN A1C: Hgb A1c MFr Bld: 6.1 % (ref 4.6–6.5)

## 2018-04-03 LAB — HEPATITIS C ANTIBODY
Hepatitis C Ab: NONREACTIVE
SIGNAL TO CUT-OFF: 0.02 (ref <1.00)

## 2018-04-04 ENCOUNTER — Ambulatory Visit (INDEPENDENT_AMBULATORY_CARE_PROVIDER_SITE_OTHER): Payer: Medicare HMO | Admitting: Internal Medicine

## 2018-04-04 ENCOUNTER — Encounter: Payer: Self-pay | Admitting: Internal Medicine

## 2018-04-04 DIAGNOSIS — M47817 Spondylosis without myelopathy or radiculopathy, lumbosacral region: Secondary | ICD-10-CM

## 2018-04-04 DIAGNOSIS — D473 Essential (hemorrhagic) thrombocythemia: Secondary | ICD-10-CM

## 2018-04-04 DIAGNOSIS — E785 Hyperlipidemia, unspecified: Secondary | ICD-10-CM

## 2018-04-04 DIAGNOSIS — D75839 Thrombocytosis, unspecified: Secondary | ICD-10-CM

## 2018-04-04 DIAGNOSIS — R739 Hyperglycemia, unspecified: Secondary | ICD-10-CM | POA: Diagnosis not present

## 2018-04-04 DIAGNOSIS — I10 Essential (primary) hypertension: Secondary | ICD-10-CM | POA: Diagnosis not present

## 2018-04-04 NOTE — Progress Notes (Signed)
[name], female   DOB: 04/13/1946, 72 y.o.   MRN: [id number]   Subjective:    [name], female    DOB: June 30, 1946, 72 y.o.   MRN: [id number]  HPI  Patient here for a scheduled follow up.  Reports she is doing relatively well.  Is walking.  No chest pain.  No sob.  No acid reflux.  No abdominal pain.  Bowels moving.  Sees Dr Sharlet Salina for her back issues.  Tolerating lisinopril.  Blood pressure doing well.     Past Medical History:  Diagnosis Date  . Blood in the stool    10 yrs ago  . Diverticulitis   . History of abnormal Pap smear   . History of chicken pox   . History of colon polyps   . Hypercholesterolemia   . Seronegative rheumatoid arthritis [facility])    Past Surgical History:  Procedure Laterality Date  . DILATION AND CURETTAGE OF UTERUS    . LEG / ANKLE SOFT TISSUE BIOPSY  2011   to confirm arthritis   Family History  Problem Relation Age of Onset  . Arthritis Father   . Hyperlipidemia Father   . Heart disease Father   . Hypertension Father   . Diabetes Father   . Pulmonary fibrosis Father   . Arthritis Mother   . Heart disease Mother   . Hyperlipidemia Mother   . Hypertension Mother   . Colon cancer Maternal Aunt   . Colon cancer Maternal Uncle   . Breast cancer Neg Hx    Social History   Socioeconomic History  . Marital status: Divorced    Spouse name: Not on file  . Number of children: Not on file  . Years of education: Not on file  . Highest education level: Not on file  Occupational History  . Not on file  Social Needs  . Financial resource strain: Not on file  . Food insecurity:    Worry: Not on file    Inability: Not on file  . Transportation needs:    Medical: Not on file    Non-medical: Not on file  Tobacco Use  . Smoking status: Never Smoker  . Smokeless tobacco: Never Used  Substance and Sexual Activity  . Alcohol use: No    Alcohol/week: 0.0 standard drinks  . Drug use: No  . Sexual activity: Not on file   Lifestyle  . Physical activity:    Days per week: Not on file    Minutes per session: Not on file  . Stress: Not on file  Relationships  . Social connections:    Talks on phone: Not on file    Gets together: Not on file    Attends religious service: Not on file    Active member of club or organization: Not on file    Attends meetings of clubs or organizations: Not on file    Relationship status: Not on file  Other Topics Concern  . Not on file  Social History Narrative  . Not on file    Outpatient Encounter Medications as of 04/04/2018  Medication Sig  . calcium elemental as carbonate (TUMS ULTRA 1000) 400 MG tablet Chew 1,000 mg by mouth daily.    . Doxylamine Succinate, Sleep, (SLEEP AID PO) OTC as directed.   Marland Kitchen lisinopril (PRINIVIL,ZESTRIL) 10 MG tablet TAKE 1 TABLET DAILY  . meloxicam (MOBIC) 15 MG tablet TAKE 1 TABLET DAILY  . Multiple Vitamins-Minerals (CENTRUM ULTRA WOMENS PO)  Take by mouth.  . pantoprazole (PROTONIX) 40 MG tablet TAKE 1 TABLET DAILY  . simvastatin (ZOCOR) 20 MG tablet TAKE 1 TABLET EVERY EVENING  . traMADol (ULTRAM) 50 MG tablet 1-2 po bid prn  . [DISCONTINUED] Chromium Picolinate 500 MCG TABS Take by mouth daily.  . [DISCONTINUED] nystatin cream (MYCOSTATIN) Apply 1 application topically 2 (two) times daily.  . [DISCONTINUED] oxybutynin (DITROPAN) 5 MG tablet TAKE 1 TABLET BY MOUTH  DAILY (Patient taking differently: TAKE 1 TABLET BY MOUTH AS NEEDED)   No facility-administered encounter medications on file as of 04/04/2018.     Review of Systems  Constitutional: Negative for appetite change and unexpected weight change.  HENT: Negative for congestion and sinus pressure.   Respiratory: Negative for cough, chest tightness and shortness of breath.   Cardiovascular: Negative for chest pain, palpitations and leg swelling.  Gastrointestinal: Negative for abdominal pain, diarrhea, nausea and vomiting.  Genitourinary: Negative for difficulty urinating and  dysuria.  Musculoskeletal: Negative for joint swelling and myalgias.  Skin: Negative for color change and rash.  Neurological: Negative for dizziness, light-headedness and headaches.  Psychiatric/Behavioral: Negative for agitation and dysphoric mood.       Objective:    Physical Exam  Constitutional: She appears well-developed and well-nourished. No distress.  HENT:  Nose: Nose normal.  Mouth/Throat: Oropharynx is clear and moist.  Neck: Neck supple. No thyromegaly present.  Cardiovascular: Normal rate and regular rhythm.  Pulmonary/Chest: Breath sounds normal. No respiratory distress. She has no wheezes.  Abdominal: Soft. Bowel sounds are normal. There is no tenderness.  Musculoskeletal: She exhibits no edema or tenderness.  Lymphadenopathy:    She has no cervical adenopathy.  Skin: No rash noted. No erythema.  Psychiatric: She has a normal mood and affect. Her behavior is normal.    BP 122/70 (BP Location: Left Arm, Patient Position: Sitting, Cuff Size: Normal)   Pulse 91   Temp 98.1 F (36.7 C) (Oral)   Resp 18   Wt 170 lb 3.2 oz (77.2 kg)   LMP 07/16/1996   SpO2 97%   BMI 32.16 kg/m  Wt Readings from Last 3 Encounters:  04/04/18 170 lb 3.2 oz (77.2 kg)  11/28/17 172 lb (78 kg)  07/25/17 172 lb 3.2 oz (78.1 kg)     Lab Results  Component Value Date   WBC 5.5 04/02/2018   HGB 15.2 (H) 04/02/2018   HCT 45.3 04/02/2018   PLT 451.0 (H) 04/02/2018   GLUCOSE 106 (H) 04/02/2018   CHOL 151 04/02/2018   TRIG 145.0 04/02/2018   HDL 35.20 (L) 04/02/2018   LDLDIRECT 153.7 11/15/2010   LDLCALC 87 04/02/2018   ALT 23 04/02/2018   AST 22 04/02/2018   NA 140 04/02/2018   K 4.0 04/02/2018   CL 106 04/02/2018   CREATININE 0.67 04/02/2018   BUN 19 04/02/2018   CO2 28 04/02/2018   TSH 2.93 07/22/2017   INR 1.1 (H) 06/21/2016   HGBA1C 6.1 04/02/2018    Mm Screening Breast Tomo Bilateral  Result Date: 10/02/2017 CLINICAL DATA:  Screening. EXAM: DIGITAL SCREENING  BILATERAL MAMMOGRAM WITH TOMO AND CAD COMPARISON:  Previous exam(s). ACR Breast Density Category b: There are scattered areas of fibroglandular density. FINDINGS: There are no findings suspicious for malignancy. Images were processed with CAD. IMPRESSION: No mammographic evidence of malignancy. A result letter of this screening mammogram will be mailed directly to the patient. RECOMMENDATION: Screening mammogram in one year. (Code:SM-B-01Y) BI-RADS CATEGORY  1: Negative. Electronically Signed  By: Fidela Salisbury M.D.   On: 10/02/2017 17:26       Assessment & Plan:   Problem List Items Addressed This Visit    Hyperglycemia    Low carb diet and exercise.  Discussed recent labs.  a1c 6.1.  Stable.  Follow.        Relevant Orders   Hemoglobin A1c   Hyperlipidemia    Low cholesterol diet and exercise.  Follow lipid panel and liver function tests.  On simvastatin.        Relevant Orders   Hepatic function panel   Lipid panel   Hypertension    On lisinopril.  Blood pressure rechecked by me:  124/78.  Doing well on current regimen.  Follow pressures.  Follow metabolic panel.        Relevant Orders   Basic metabolic panel   TSH   Lumbosacral spondylosis    Followed by Dr Sharlet Salina.        Thrombocytosis (HCC)    Platelet count still slightly elevated, but relatively stable.  Follow.        Relevant Orders   CBC with Differential/Platelet   Ferritin       Einar Pheasant, MD

## 2018-04-08 DIAGNOSIS — R69 Illness, unspecified: Secondary | ICD-10-CM | POA: Diagnosis not present

## 2018-04-10 DIAGNOSIS — M170 Bilateral primary osteoarthritis of knee: Secondary | ICD-10-CM | POA: Diagnosis not present

## 2018-04-13 ENCOUNTER — Encounter: Payer: Self-pay | Admitting: Internal Medicine

## 2018-04-13 NOTE — Assessment & Plan Note (Signed)
Followed by Dr Chasnis.   

## 2018-04-13 NOTE — Assessment & Plan Note (Signed)
Low carb diet and exercise.  Discussed recent labs.  a1c 6.1.  Stable.  Follow.

## 2018-04-13 NOTE — Assessment & Plan Note (Signed)
Low cholesterol diet and exercise.  Follow lipid panel and liver function tests.  On simvastatin.   

## 2018-04-13 NOTE — Assessment & Plan Note (Signed)
Platelet count still slightly elevated, but relatively stable.  Follow.

## 2018-04-13 NOTE — Assessment & Plan Note (Signed)
On lisinopril.  Blood pressure rechecked by me:  124/78.  Doing well on current regimen.  Follow pressures.  Follow metabolic panel.

## 2018-05-02 ENCOUNTER — Other Ambulatory Visit: Payer: Self-pay | Admitting: Internal Medicine

## 2018-06-27 DIAGNOSIS — M170 Bilateral primary osteoarthritis of knee: Secondary | ICD-10-CM | POA: Diagnosis not present

## 2018-06-29 DIAGNOSIS — Z86010 Personal history of colonic polyps: Secondary | ICD-10-CM | POA: Insufficient documentation

## 2018-06-29 DIAGNOSIS — E785 Hyperlipidemia, unspecified: Secondary | ICD-10-CM | POA: Insufficient documentation

## 2018-06-29 DIAGNOSIS — K5792 Diverticulitis of intestine, part unspecified, without perforation or abscess without bleeding: Secondary | ICD-10-CM | POA: Insufficient documentation

## 2018-07-22 DIAGNOSIS — M170 Bilateral primary osteoarthritis of knee: Secondary | ICD-10-CM | POA: Diagnosis not present

## 2018-07-31 ENCOUNTER — Other Ambulatory Visit: Payer: Self-pay | Admitting: Internal Medicine

## 2018-08-12 ENCOUNTER — Other Ambulatory Visit (INDEPENDENT_AMBULATORY_CARE_PROVIDER_SITE_OTHER): Payer: Medicare HMO

## 2018-08-12 DIAGNOSIS — D473 Essential (hemorrhagic) thrombocythemia: Secondary | ICD-10-CM

## 2018-08-12 DIAGNOSIS — D75839 Thrombocytosis, unspecified: Secondary | ICD-10-CM

## 2018-08-12 DIAGNOSIS — E785 Hyperlipidemia, unspecified: Secondary | ICD-10-CM | POA: Diagnosis not present

## 2018-08-12 DIAGNOSIS — R739 Hyperglycemia, unspecified: Secondary | ICD-10-CM

## 2018-08-12 DIAGNOSIS — I10 Essential (primary) hypertension: Secondary | ICD-10-CM

## 2018-08-12 LAB — CBC WITH DIFFERENTIAL/PLATELET
BASOS PCT: 0.9 % (ref 0.0–3.0)
Basophils Absolute: 0 10*3/uL (ref 0.0–0.1)
EOS ABS: 0.1 10*3/uL (ref 0.0–0.7)
Eosinophils Relative: 2.8 % (ref 0.0–5.0)
HCT: 45.7 % (ref 36.0–46.0)
Hemoglobin: 15.6 g/dL — ABNORMAL HIGH (ref 12.0–15.0)
Lymphocytes Relative: 26.6 % (ref 12.0–46.0)
Lymphs Abs: 1.3 10*3/uL (ref 0.7–4.0)
MCHC: 34.1 g/dL (ref 30.0–36.0)
MCV: 89.6 fl (ref 78.0–100.0)
MONO ABS: 0.4 10*3/uL (ref 0.1–1.0)
Monocytes Relative: 8.7 % (ref 3.0–12.0)
NEUTROS ABS: 3.1 10*3/uL (ref 1.4–7.7)
Neutrophils Relative %: 61 % (ref 43.0–77.0)
PLATELETS: 498 10*3/uL — AB (ref 150.0–400.0)
RBC: 5.1 Mil/uL (ref 3.87–5.11)
RDW: 14 % (ref 11.5–15.5)
WBC: 5 10*3/uL (ref 4.0–10.5)

## 2018-08-12 LAB — LIPID PANEL
CHOLESTEROL: 153 mg/dL (ref 0–200)
HDL: 42.8 mg/dL (ref >39.00)
LDL Cholesterol: 88 mg/dL (ref 0–99)
NonHDL: 110.66
Total CHOL/HDL Ratio: 4
Triglycerides: 111 mg/dL (ref 0.0–149.0)
VLDL: 22.2 mg/dL (ref 0.0–40.0)

## 2018-08-12 LAB — HEPATIC FUNCTION PANEL
ALBUMIN: 4.3 g/dL (ref 3.5–5.2)
ALT: 26 U/L (ref 0–35)
AST: 24 U/L (ref 0–37)
Alkaline Phosphatase: 73 U/L (ref 39–117)
Bilirubin, Direct: 0.1 mg/dL (ref 0.0–0.3)
Total Bilirubin: 0.5 mg/dL (ref 0.2–1.2)
Total Protein: 7.3 g/dL (ref 6.0–8.3)

## 2018-08-12 LAB — BASIC METABOLIC PANEL
BUN: 21 mg/dL (ref 6–23)
CALCIUM: 10 mg/dL (ref 8.4–10.5)
CO2: 27 mEq/L (ref 19–32)
Chloride: 103 mEq/L (ref 96–112)
Creatinine, Ser: 0.77 mg/dL (ref 0.40–1.20)
GFR: 73.29 mL/min (ref >60.00)
Glucose, Bld: 100 mg/dL — ABNORMAL HIGH (ref 70–99)
Potassium: 4.3 mEq/L (ref 3.5–5.1)
Sodium: 138 mEq/L (ref 135–145)

## 2018-08-12 LAB — FERRITIN: FERRITIN: 34.7 ng/mL (ref 10.0–291.0)

## 2018-08-12 LAB — HEMOGLOBIN A1C: HEMOGLOBIN A1C: 6 % (ref 4.6–6.5)

## 2018-08-12 LAB — TSH: TSH: 1.4 u[IU]/mL (ref 0.35–4.50)

## 2018-08-14 ENCOUNTER — Encounter: Payer: Self-pay | Admitting: Internal Medicine

## 2018-08-14 ENCOUNTER — Ambulatory Visit (INDEPENDENT_AMBULATORY_CARE_PROVIDER_SITE_OTHER): Payer: Medicare HMO | Admitting: Internal Medicine

## 2018-08-14 VITALS — BP 118/72 | HR 88 | Temp 97.7°F | Resp 18 | Wt 169.0 lb

## 2018-08-14 DIAGNOSIS — R739 Hyperglycemia, unspecified: Secondary | ICD-10-CM | POA: Diagnosis not present

## 2018-08-14 DIAGNOSIS — E785 Hyperlipidemia, unspecified: Secondary | ICD-10-CM | POA: Diagnosis not present

## 2018-08-14 DIAGNOSIS — Z6831 Body mass index (BMI) 31.0-31.9, adult: Secondary | ICD-10-CM

## 2018-08-14 DIAGNOSIS — D473 Essential (hemorrhagic) thrombocythemia: Secondary | ICD-10-CM

## 2018-08-14 DIAGNOSIS — I10 Essential (primary) hypertension: Secondary | ICD-10-CM

## 2018-08-14 DIAGNOSIS — Z0000 Encounter for general adult medical examination without abnormal findings: Secondary | ICD-10-CM

## 2018-08-14 DIAGNOSIS — D75839 Thrombocytosis, unspecified: Secondary | ICD-10-CM

## 2018-08-14 NOTE — Assessment & Plan Note (Signed)
Physical today 08/14/18.  Mammogram 10/02/17 - Birads I.  Colonoscopy due this year.

## 2018-08-14 NOTE — Progress Notes (Signed)
[name], female   DOB: 1945-08-15, 73 y.o.   MRN: [id number]   Subjective:    [name], female    DOB: 1945/07/26, 73 y.o.   MRN: [id number]  HPI  Patient here for her physical exam.  Followed by ortho for degenerative arthrosis of both knees.  Just received cortisone injections.  Planning for TKA at the end of April.  States she is doing well otherwise.  Trying to stay active.  Walks.  No chest pain. No sob.  No acid reflux.  No abdominal pain.  Bowels moving.  No urine change.  Discussed labs.  She is interested in weight loss, especially before her knee surgery.  Discussed treatment options.  Will have her meet with Catie to discuss.  Discussed a1c 6.0 - diet and exercise.     Past Medical History:  Diagnosis Date  . Blood in the stool    10 yrs ago  . Diverticulitis   . History of abnormal Pap smear   . History of chicken pox   . History of colon polyps   . Hypercholesterolemia   . Seronegative rheumatoid arthritis [facility])    Past Surgical History:  Procedure Laterality Date  . DILATION AND CURETTAGE OF UTERUS    . LEG / ANKLE SOFT TISSUE BIOPSY  2011   to confirm arthritis   Family History  Problem Relation Age of Onset  . Arthritis Father   . Hyperlipidemia Father   . Heart disease Father   . Hypertension Father   . Diabetes Father   . Pulmonary fibrosis Father   . Arthritis Mother   . Heart disease Mother   . Hyperlipidemia Mother   . Hypertension Mother   . Colon cancer Maternal Aunt   . Colon cancer Maternal Uncle   . Breast cancer Neg Hx    Social History   Socioeconomic History  . Marital status: Divorced    Spouse name: Not on file  . Number of children: Not on file  . Years of education: Not on file  . Highest education level: Not on file  Occupational History  . Not on file  Social Needs  . Financial resource strain: Not on file  . Food insecurity:    Worry: Not on file    Inability: Not on file  . Transportation  needs:    Medical: Not on file    Non-medical: Not on file  Tobacco Use  . Smoking status: Never Smoker  . Smokeless tobacco: Never Used  Substance and Sexual Activity  . Alcohol use: No    Alcohol/week: 0.0 standard drinks  . Drug use: No  . Sexual activity: Not on file  Lifestyle  . Physical activity:    Days per week: Not on file    Minutes per session: Not on file  . Stress: Not on file  Relationships  . Social connections:    Talks on phone: Not on file    Gets together: Not on file    Attends religious service: Not on file    Active member of club or organization: Not on file    Attends meetings of clubs or organizations: Not on file    Relationship status: Not on file  Other Topics Concern  . Not on file  Social History Narrative  . Not on file    Outpatient Encounter Medications as of 08/14/2018  Medication Sig  . calcium elemental as carbonate (TUMS ULTRA 1000) 400 MG tablet  Chew 1,000 mg by mouth daily.    . CHROMIUM PICOLINATE PO Take by mouth.  . Doxylamine Succinate, Sleep, (SLEEP AID PO) OTC as directed.   Marland Kitchen lisinopril (PRINIVIL,ZESTRIL) 10 MG tablet TAKE 1 TABLET DAILY  . meloxicam (MOBIC) 15 MG tablet TAKE 1 TABLET DAILY  . Multiple Vitamins-Minerals (CENTRUM ULTRA WOMENS PO) Take by mouth.  . Omega-3 Fatty Acids (RA FISH OIL) 1000 MG CAPS Take by mouth.  . pantoprazole (PROTONIX) 40 MG tablet TAKE 1 TABLET DAILY  . simvastatin (ZOCOR) 20 MG tablet TAKE 1 TABLET EVERY EVENING  . traMADol (ULTRAM) 50 MG tablet 1-2 po bid prn   No facility-administered encounter medications on file as of 08/14/2018.     Review of Systems  Constitutional: Negative for appetite change and unexpected weight change.  HENT: Negative for congestion and sinus pressure.   Eyes: Negative for pain and visual disturbance.  Respiratory: Negative for cough, chest tightness and shortness of breath.   Cardiovascular: Negative for chest pain, palpitations and leg swelling.    Gastrointestinal: Negative for abdominal pain, diarrhea, nausea and vomiting.  Genitourinary: Negative for difficulty urinating and dysuria.  Musculoskeletal: Negative for joint swelling and myalgias.  Skin: Negative for color change and rash.  Neurological: Negative for dizziness, light-headedness and headaches.  Hematological: Negative for adenopathy. Does not bruise/bleed easily.  Psychiatric/Behavioral: Negative for agitation and dysphoric mood.       Objective:    Physical Exam Constitutional:      General: She is not in acute distress.    Appearance: Normal appearance. She is well-developed.  HENT:     Nose: Nose normal. No congestion.     Mouth/Throat:     Pharynx: No oropharyngeal exudate or posterior oropharyngeal erythema.  Eyes:     General: No scleral icterus.       Right eye: No discharge.        Left eye: No discharge.  Neck:     Musculoskeletal: Neck supple. No muscular tenderness.     Thyroid: No thyromegaly.  Cardiovascular:     Rate and Rhythm: Normal rate and regular rhythm.  Pulmonary:     Effort: No tachypnea, accessory muscle usage or respiratory distress.     Breath sounds: Normal breath sounds. No decreased breath sounds or wheezing.  Chest:     Breasts:        Right: No inverted nipple, mass, nipple discharge or tenderness (no axillary adenopathy).        Left: No inverted nipple, mass, nipple discharge or tenderness (no axilarry adenopathy).  Abdominal:     General: Bowel sounds are normal.     Palpations: Abdomen is soft.     Tenderness: There is no abdominal tenderness.  Musculoskeletal:        General: No swelling or tenderness.  Lymphadenopathy:     Cervical: No cervical adenopathy.  Skin:    Findings: No erythema or rash.  Neurological:     Mental Status: She is alert and oriented to person, place, and time.  Psychiatric:        Mood and Affect: Mood normal.        Behavior: Behavior normal.     BP 118/72 (BP Location: Left Arm,  Patient Position: Sitting, Cuff Size: Normal)   Pulse 88   Temp 97.7 F (36.5 C) (Oral)   Resp 18   Wt 169 lb (76.7 kg)   LMP 07/16/1996   SpO2 97%   BMI 31.93 kg/m  Wt Readings from  Last 3 Encounters:  08/14/18 169 lb (76.7 kg)  04/04/18 170 lb 3.2 oz (77.2 kg)  11/28/17 172 lb (78 kg)     Lab Results  Component Value Date   WBC 5.0 08/12/2018   HGB 15.6 (H) 08/12/2018   HCT 45.7 08/12/2018   PLT 498.0 (H) 08/12/2018   GLUCOSE 100 (H) 08/12/2018   CHOL 153 08/12/2018   TRIG 111.0 08/12/2018   HDL 42.80 08/12/2018   LDLDIRECT 153.7 11/15/2010   LDLCALC 88 08/12/2018   ALT 26 08/12/2018   AST 24 08/12/2018   NA 138 08/12/2018   K 4.3 08/12/2018   CL 103 08/12/2018   CREATININE 0.77 08/12/2018   BUN 21 08/12/2018   CO2 27 08/12/2018   TSH 1.40 08/12/2018   INR 1.1 (H) 06/21/2016   HGBA1C 6.0 08/12/2018    Mm Screening Breast Tomo Bilateral  Result Date: 10/02/2017 CLINICAL DATA:  Screening. EXAM: DIGITAL SCREENING BILATERAL MAMMOGRAM WITH TOMO AND CAD COMPARISON:  Previous exam(s). ACR Breast Density Category b: There are scattered areas of fibroglandular density. FINDINGS: There are no findings suspicious for malignancy. Images were processed with CAD. IMPRESSION: No mammographic evidence of malignancy. A result letter of this screening mammogram will be mailed directly to the patient. RECOMMENDATION: Screening mammogram in one year. (Code:SM-B-01Y) BI-RADS CATEGORY  1: Negative. Electronically Signed   By: Fidela Salisbury M.D.   On: 10/02/2017 17:26       Assessment & Plan:   Problem List Items Addressed This Visit    BMI 31.0-31.9,adult    Discussed diet and exercise.  She is interested in weight loss medication, especially prior to her surgery.  Discussed meeting with Catie to discuss treatment options.  Discussed a1c 6.0 - low carb diet and exercise.        Health care maintenance    Physical today 08/14/18.  Mammogram 10/02/17 - Birads I.  Colonoscopy due  this year.        Hyperglycemia    Low carb diet and exercise.  Follow met b and a1c.  Recent a1c 6.0.        Hyperlipidemia    Low cholesterol diet and exercise.  Follow lipid panel and liver function tests.  On simvastatin.        Hypertension    Blood pressure under good control.  Continue same medication regimen.  Follow pressures.  Follow metabolic panel.        Thrombocytosis (HCC)    Platelet count remains slightly elevated.  Discussed possible inflammatory marker, etc.  Will continue to follow.         Other Visit Diagnoses    Routine general medical examination at a health care facility    -  Primary       Einar Pheasant, MD

## 2018-08-17 ENCOUNTER — Encounter: Payer: Self-pay | Admitting: Internal Medicine

## 2018-08-17 NOTE — Assessment & Plan Note (Signed)
Platelet count remains slightly elevated.  Discussed possible inflammatory marker, etc.  Will continue to follow.

## 2018-08-17 NOTE — Assessment & Plan Note (Signed)
Discussed diet and exercise.  She is interested in weight loss medication, especially prior to her surgery.  Discussed meeting with Catie to discuss treatment options.  Discussed a1c 6.0 - low carb diet and exercise.

## 2018-08-17 NOTE — Assessment & Plan Note (Signed)
Blood pressure under good control.  Continue same medication regimen.  Follow pressures.  Follow metabolic panel.   

## 2018-08-17 NOTE — Assessment & Plan Note (Signed)
Low carb diet and exercise.  Follow met b and a1c.  Recent a1c 6.0.   

## 2018-08-17 NOTE — Assessment & Plan Note (Signed)
Low cholesterol diet and exercise.  Follow lipid panel and liver function tests.  On simvastatin.   

## 2018-08-18 ENCOUNTER — Encounter: Payer: Self-pay | Admitting: Pharmacist

## 2018-08-18 ENCOUNTER — Ambulatory Visit (INDEPENDENT_AMBULATORY_CARE_PROVIDER_SITE_OTHER): Payer: Medicare HMO | Admitting: Pharmacist

## 2018-08-18 VITALS — BP 114/74 | HR 91 | Wt 170.2 lb

## 2018-08-18 DIAGNOSIS — Z6832 Body mass index (BMI) 32.0-32.9, adult: Secondary | ICD-10-CM | POA: Diagnosis not present

## 2018-08-18 DIAGNOSIS — E669 Obesity, unspecified: Secondary | ICD-10-CM | POA: Diagnosis not present

## 2018-08-18 MED ORDER — SEMAGLUTIDE(0.25 OR 0.5MG/DOS) 2 MG/1.5ML ~~LOC~~ SOPN: 0.5000 mg | PEN_INJECTOR | SUBCUTANEOUS | 1 refills | Status: DC

## 2018-08-18 NOTE — Patient Instructions (Signed)
It was great to see you today!  We are going to add Ozempic (semaglutide) to help with weight loss and blood sugars. Inject 0.25 mg once weekly for 4 weeks, then, if tolerated, increase to 0.5 mg once weekly.   I contacted our [place] representative; she is planning on dropping off the sample at clinic on Thursday morning. I will leave a message with the front desk staff to contact you when this comes in.   Let us know if you cannot afford the copayment at the pharmacy, and we can readjust our plan.   Please feel free to contact clinic with any questions or concerns.   [name], PharmD

## 2018-08-18 NOTE — Progress Notes (Addendum)
    S:     Chief Complaint  Patient presents with  . Medication Management    Obesity   Patient arrives in good spirits, ambulating without assistance. Presents for obesity treatment at the request of Dr. Nicki Reaper (last appointment on 08/14/2018). She has had pre-diabetes for many years. She notes that she would like to try to lose weight prior to her TKA in April.   Family/Social History: father had diabetes; mother and father have family history of heart disease; father CABG at age 58; mom CHF   Insurance coverage/medication affordability: [employer]   Current hypertension medications include: lisinopril 10 mg daily   Patient reported dietary habits: Eats 3 Meals/day 7:30, takes dog out to walk Breakfast: toast; cheese toast; moving more towards eggs  Lunch: Sandwich w/ chips/fruit Dinner: Pastas; chicken; less red meats; tries to get vegetables in Snacks: none; occasionally yogurt  Desserts: "this is my weakness"; chocolate, cookies  Drinks: Water; 1 glass milk in the morning; 1/2 diet coke at lunch  Patient reported exercise habits: Walks 1 mile daily; limited by physical activity    O:  Physical Exam Constitutional:      Appearance: Normal appearance.  Neurological:     Mental Status: She is alert.     Review of Systems  All other systems reviewed and are negative.   Lab Results  Component Value Date   HGBA1C 6.0 08/12/2018   Vitals:   08/18/18 1522  BP: 114/74  Pulse: 91    Basic Metabolic Panel BMP Latest Ref Rng & Units 08/12/2018 04/02/2018 11/26/2017  Glucose 70 - 99 mg/dL 100(H) 106(H) 121(H)  BUN 6 - 23 mg/dL 21 19 20   Creatinine 0.40 - 1.20 mg/dL 0.77 0.67 0.73  Sodium 135 - 145 mEq/L 138 140 142  Potassium 3.5 - 5.1 mEq/L 4.3 4.0 3.8  Chloride 96 - 112 mEq/L 103 106 106  CO2 19 - 32 mEq/L 27 28 28   Calcium 8.4 - 10.5 mg/dL 10.0 9.9 9.7     Lipid Panel     Component Value Date/Time   CHOL 153 08/12/2018 0856   TRIG 111.0 08/12/2018 0856   HDL 42.80 08/12/2018 0856   CHOLHDL 4 08/12/2018 0856   VLDL 22.2 08/12/2018 0856   LDLCALC 88 08/12/2018 0856   LDLDIRECT 153.7 11/15/2010 0926     A/P: Following discussion and approval by Dr. Nicki Reaper, the following medication changes were made:   #Obesity - D/t pre-diabetes and patient's decision that portion sizes are contributing to weight, initiation of GLP1 therapy is appropriate at this time. Patient prefers once weekly therapy to once daily - Start Ozempic 0.25 mg once weekly x 4 weeks; if tolerated, increase to 0.5 mg once daily.  - Counseled on injection technique, mechanism of action, and side effect profile - Provided a Healthy Meals Planning handout; discussed ways to maximize protein and minimize carbohydrate intake   Written patient instructions provided.  Total time in face to face counseling 45 minutes.    Follow up in Pharmacist Clinic Visit 4 weeks. De Hollingshead, PharmD PGY2 Ambulatory Care Pharmacy Resident Phone: [phone]   Reviewed above information.  Agree with assessment and plan.    Dr Nicki Reaper

## 2018-08-18 NOTE — Assessment & Plan Note (Signed)
#  Obesity - D/t pre-diabetes and patient's decision that portion sizes are contributing to weight, initiation of GLP1 therapy is appropriate at this time. Patient prefers once weekly therapy to once daily - Start Ozempic 0.25 mg once weekly x 4 weeks; if tolerated, increase to 0.5 mg once daily.  - Counseled on injection technique, mechanism of action, and side effect profile - Provided a Healthy Meals Planning handout; discussed ways to maximize protein and minimize carbohydrate intake

## 2018-08-20 ENCOUNTER — Encounter: Payer: Self-pay | Admitting: Internal Medicine

## 2018-08-20 NOTE — Telephone Encounter (Signed)
I would prefer her trying ozempic.  If we can get samples and help with cost - would prefer ozempic over phentermine.

## 2018-08-21 NOTE — Telephone Encounter (Signed)
Patient is aware that she has samples here and will try that.

## 2018-08-22 NOTE — Progress Notes (Signed)
Agreed, thank you for providing the sample, Larena Glassman!  Will follow up with patient on patient assistance options moving forward.   [name], PharmD, [place] PGY2 Ambulatory Care Pharmacy Resident, [facility] Phone: [phone]

## 2018-08-22 NOTE — Progress Notes (Signed)
Patient came and given 1 pen to use for 4 weeks. She has f/u scheduled with you on march 9

## 2018-08-25 DIAGNOSIS — R69 Illness, unspecified: Secondary | ICD-10-CM | POA: Diagnosis not present

## 2018-08-28 ENCOUNTER — Other Ambulatory Visit: Payer: Self-pay | Admitting: Internal Medicine

## 2018-08-28 DIAGNOSIS — Z1231 Encounter for screening mammogram for malignant neoplasm of breast: Secondary | ICD-10-CM

## 2018-09-10 ENCOUNTER — Encounter: Payer: Self-pay | Admitting: Internal Medicine

## 2018-09-10 NOTE — Telephone Encounter (Signed)
I would like to hold on phentermine.  I do not usually use this medication for weight loss.  Thoughts?  Do you have any other suggestions?

## 2018-09-12 NOTE — Telephone Encounter (Signed)
LMTCB

## 2018-09-12 NOTE — Telephone Encounter (Signed)
Pt scheduled  

## 2018-09-12 NOTE — Telephone Encounter (Signed)
I can see her Monday at 12:00 to discuss.

## 2018-09-15 ENCOUNTER — Ambulatory Visit: Payer: Medicare HMO | Admitting: Internal Medicine

## 2018-09-16 ENCOUNTER — Encounter: Payer: Self-pay | Admitting: Internal Medicine

## 2018-09-16 ENCOUNTER — Ambulatory Visit: Payer: Medicare HMO | Admitting: Internal Medicine

## 2018-09-17 NOTE — Telephone Encounter (Signed)
See attached

## 2018-09-17 NOTE — Telephone Encounter (Signed)
Patient declined the 4:30 appointment offered. Is there a day this week that we can do a 12:30? I have her scheduled for 3/12 at 12:00 as of right now.

## 2018-09-19 ENCOUNTER — Other Ambulatory Visit: Payer: Self-pay | Admitting: Internal Medicine

## 2018-09-22 ENCOUNTER — Ambulatory Visit: Payer: Medicare HMO | Admitting: Pharmacist

## 2018-09-25 ENCOUNTER — Ambulatory Visit (INDEPENDENT_AMBULATORY_CARE_PROVIDER_SITE_OTHER): Payer: Medicare HMO | Admitting: Internal Medicine

## 2018-09-25 ENCOUNTER — Encounter: Payer: Self-pay | Admitting: Internal Medicine

## 2018-09-25 ENCOUNTER — Other Ambulatory Visit: Payer: Self-pay

## 2018-09-25 DIAGNOSIS — Z6831 Body mass index (BMI) 31.0-31.9, adult: Secondary | ICD-10-CM

## 2018-09-25 DIAGNOSIS — R739 Hyperglycemia, unspecified: Secondary | ICD-10-CM | POA: Diagnosis not present

## 2018-09-25 DIAGNOSIS — E785 Hyperlipidemia, unspecified: Secondary | ICD-10-CM

## 2018-09-25 DIAGNOSIS — I10 Essential (primary) hypertension: Secondary | ICD-10-CM

## 2018-09-25 MED ORDER — PHENTERMINE HCL 15 MG PO CAPS: 15.0000 mg | ORAL_CAPSULE | Freq: Every day | ORAL | 1 refills | Status: DC

## 2018-09-25 NOTE — Progress Notes (Signed)
[name], female   DOB: [birth year], 73 y.o.   MRN: [id number]   Subjective:    [name], female    DOB: 13-Apr-1946, 73 y.o.   MRN: [id number]  HPI  Patient here as a work in with concerns regarding the need for weight loss.  She is planning to have knee surgery.  Has postponed her knee surgery until 04/2019.  Has been trying to lose weight.  Has been watching her diet.  Exercising.  Started on ozempic.  Wants to try phentermine.  Discussed risk and possible side effects of the medication.  Blood pressure doing well.  States she just needs something to give her a jump start.  No chest pain.  No sob.  No acid reflux.  No abdominal pain.  Bowels moving.  Overall feels good.     Past Medical History:  Diagnosis Date  . Blood in the stool    10 yrs ago  . Diverticulitis   . History of abnormal Pap smear   . History of chicken pox   . History of colon polyps   . Hypercholesterolemia   . Seronegative rheumatoid arthritis [facility])    Past Surgical History:  Procedure Laterality Date  . DILATION AND CURETTAGE OF UTERUS    . LEG / ANKLE SOFT TISSUE BIOPSY  2011   to confirm arthritis   Family History  Problem Relation Age of Onset  . Arthritis Father   . Hyperlipidemia Father   . Heart disease Father   . Hypertension Father   . Diabetes Father   . Pulmonary fibrosis Father   . Arthritis Mother   . Heart disease Mother   . Hyperlipidemia Mother   . Hypertension Mother   . Colon cancer Maternal Aunt   . Colon cancer Maternal Uncle   . Breast cancer Neg Hx    Social History   Socioeconomic History  . Marital status: Divorced    Spouse name: Not on file  . Number of children: Not on file  . Years of education: Not on file  . Highest education level: Not on file  Occupational History  . Not on file  Social Needs  . Financial resource strain: Not on file  . Food insecurity:    Worry: Not on file    Inability: Not on file  . Transportation needs:    Medical: Not on file    Non-medical: Not on file  Tobacco Use  . Smoking status: Never Smoker  . Smokeless tobacco: Never Used  Substance and Sexual Activity  . Alcohol use: No    Alcohol/week: 0.0 standard drinks  . Drug use: No  . Sexual activity: Not on file  Lifestyle  . Physical activity:    Days per week: Not on file    Minutes per session: Not on file  . Stress: Not on file  Relationships  . Social connections:    Talks on phone: Not on file    Gets together: Not on file    Attends religious service: Not on file    Active member of club or organization: Not on file    Attends meetings of clubs or organizations: Not on file    Relationship status: Not on file  Other Topics Concern  . Not on file  Social History Narrative  . Not on file    Outpatient Encounter Medications as of 09/25/2018  Medication Sig  . aspirin 81 MG chewable tablet Chew 81 mg by  mouth daily.  . calcium elemental as carbonate (TUMS ULTRA 1000) 400 MG tablet Chew 1,000 mg by mouth daily.    . CHROMIUM PICOLINATE PO Take by mouth.  . Doxylamine Succinate, Sleep, (SLEEP AID PO) OTC as directed.   . lisinopril (PRINIVIL,ZESTRIL) 10 MG tablet TAKE 1 TABLET DAILY  . meloxicam (MOBIC) 15 MG tablet TAKE 1 TABLET DAILY  . Multiple Vitamins-Minerals (CENTRUM ULTRA WOMENS PO) Take by mouth.  . Omega-3 Fatty Acids (RA FISH OIL) 1000 MG CAPS Take by mouth.  . pantoprazole (PROTONIX) 40 MG tablet TAKE 1 TABLET DAILY  . Semaglutide,0.25 or 0.5MG/DOS, (OZEMPIC, 0.25 OR 0.5 MG/DOSE,) 2 MG/1.5ML SOPN Inject 0.5 mg into the skin once a week.  . simvastatin (ZOCOR) 20 MG tablet TAKE 1 TABLET EVERY EVENING  . traMADol (ULTRAM) 50 MG tablet 1-2 po bid prn  . phentermine 15 MG capsule Take 1 capsule (15 mg total) by mouth daily.   No facility-administered encounter medications on file as of 09/25/2018.     Review of Systems  Constitutional: Negative for appetite change and unexpected weight change.  HENT: Negative  for congestion and sinus pressure.   Respiratory: Negative for cough, chest tightness and shortness of breath.   Cardiovascular: Negative for chest pain, palpitations and leg swelling.  Gastrointestinal: Negative for abdominal pain, diarrhea, nausea and vomiting.  Genitourinary: Negative for difficulty urinating and dysuria.  Musculoskeletal: Negative for joint swelling and myalgias.  Skin: Negative for color change and rash.  Neurological: Negative for dizziness, light-headedness and headaches.  Psychiatric/Behavioral: Negative for agitation and dysphoric mood.       Objective:    Physical Exam Constitutional:      General: She is not in acute distress.    Appearance: Normal appearance.  HENT:     Nose: Nose normal. No congestion.     Mouth/Throat:     Pharynx: No oropharyngeal exudate or posterior oropharyngeal erythema.  Neck:     Musculoskeletal: Neck supple. No muscular tenderness.     Thyroid: No thyromegaly.  Cardiovascular:     Rate and Rhythm: Normal rate and regular rhythm.  Pulmonary:     Effort: No respiratory distress.     Breath sounds: Normal breath sounds. No wheezing.  Abdominal:     General: Bowel sounds are normal.     Palpations: Abdomen is soft.     Tenderness: There is no abdominal tenderness.  Musculoskeletal:        General: No swelling or tenderness.  Lymphadenopathy:     Cervical: No cervical adenopathy.  Skin:    Findings: No erythema or rash.  Neurological:     Mental Status: She is alert.  Psychiatric:        Mood and Affect: Mood normal.        Behavior: Behavior normal.     BP 112/72   Pulse 80   Temp 97.9 F (36.6 C) (Oral)   Resp 16   Wt 168 lb 9.6 oz (76.5 kg)   LMP 07/16/1996   SpO2 96%   BMI 31.86 kg/m  Wt Readings from Last 3 Encounters:  09/25/18 168 lb 9.6 oz (76.5 kg)  08/18/18 170 lb 3.2 oz (77.2 kg)  08/14/18 169 lb (76.7 kg)     Lab Results  Component Value Date   WBC 5.0 08/12/2018   HGB 15.6 (H)  08/12/2018   HCT 45.7 08/12/2018   PLT 498.0 (H) 08/12/2018   GLUCOSE 100 (H) 08/12/2018   CHOL 153 08/12/2018     TRIG 111.0 08/12/2018   HDL 42.80 08/12/2018   LDLDIRECT 153.7 11/15/2010   LDLCALC 88 08/12/2018   ALT 26 08/12/2018   AST 24 08/12/2018   NA 138 08/12/2018   K 4.3 08/12/2018   CL 103 08/12/2018   CREATININE 0.77 08/12/2018   BUN 21 08/12/2018   CO2 27 08/12/2018   TSH 1.40 08/12/2018   INR 1.1 (H) 06/21/2016   HGBA1C 6.0 08/12/2018    Mm Screening Breast Tomo Bilateral  Result Date: 10/02/2017 CLINICAL DATA:  Screening. EXAM: DIGITAL SCREENING BILATERAL MAMMOGRAM WITH TOMO AND CAD COMPARISON:  Previous exam(s). ACR Breast Density Category b: There are scattered areas of fibroglandular density. FINDINGS: There are no findings suspicious for malignancy. Images were processed with CAD. IMPRESSION: No mammographic evidence of malignancy. A result letter of this screening mammogram will be mailed directly to the patient. RECOMMENDATION: Screening mammogram in one year. (Code:SM-B-01Y) BI-RADS CATEGORY  1: Negative. Electronically Signed   By: Dobrinka  Dimitrova M.D.   On: 10/02/2017 17:26       Assessment & Plan:   Problem List Items Addressed This Visit    BMI 31.0-31.9,adult    Discussed continued diet and exercise.  Will start phentermine as directed.  Follow pressures.  Send in readings over the next 3-4 weeks.        Hyperglycemia    Low carb diet and exercise.  Follow met b and a1c.       Hyperlipidemia    Low cholesterol diet and exercise.  On simvastatin.  Follow lipid panel and liver function tests.        Hypertension    Blood pressure under good control.  Continue same medication regimen.  Follow pressures.  Follow metabolic panel.  Discussed the importance of following her blood pressure while on phentermine.  Follow.            Charlene Scott, MD  

## 2018-09-27 ENCOUNTER — Encounter: Payer: Self-pay | Admitting: Internal Medicine

## 2018-09-27 NOTE — Assessment & Plan Note (Signed)
Discussed continued diet and exercise.  Will start phentermine as directed.  Follow pressures.  Send in readings over the next 3-4 weeks.

## 2018-09-27 NOTE — Assessment & Plan Note (Signed)
Low carb diet and exercise.  Follow met b and a1c.  

## 2018-09-27 NOTE — Assessment & Plan Note (Signed)
Low cholesterol diet and exercise.  On simvastatin.  Follow lipid panel and liver function tests.   

## 2018-09-27 NOTE — Assessment & Plan Note (Signed)
Blood pressure under good control.  Continue same medication regimen.  Follow pressures.  Follow metabolic panel.  Discussed the importance of following her blood pressure while on phentermine.  Follow.

## 2018-10-02 ENCOUNTER — Encounter: Payer: Self-pay | Admitting: Internal Medicine

## 2018-10-13 ENCOUNTER — Encounter: Payer: Self-pay | Admitting: Internal Medicine

## 2018-10-15 MED ORDER — PHENTERMINE HCL 30 MG PO CAPS: 30.0000 mg | ORAL_CAPSULE | Freq: Every day | ORAL | 0 refills | Status: DC

## 2018-10-15 NOTE — Telephone Encounter (Signed)
rx ok'd for phentermine #90 with no refills.

## 2018-10-26 ENCOUNTER — Encounter: Payer: Self-pay | Admitting: Internal Medicine

## 2018-10-27 ENCOUNTER — Other Ambulatory Visit: Payer: Self-pay

## 2018-10-27 MED ORDER — PHENTERMINE HCL 30 MG PO CAPS: 30.0000 mg | ORAL_CAPSULE | Freq: Every day | ORAL | 0 refills | Status: DC

## 2018-10-29 ENCOUNTER — Other Ambulatory Visit: Payer: Self-pay | Admitting: Internal Medicine

## 2018-10-29 NOTE — Telephone Encounter (Signed)
I do not mind refilling the medication , but please confirm with pt if she feels she still needs this dose of medication.  If still feels needs, she if she would be agreeable to try 7.5mg  q day.  Just let me know.

## 2018-10-29 NOTE — Telephone Encounter (Signed)
Last OV 09/25/2018  Last refilled 07/31/2018 disp 90 with 1 refill   Next OV 12/03/2018  Sent to PCP for approval

## 2018-10-30 NOTE — Telephone Encounter (Signed)
Patient says she does not need this. She does not take it every day. She doesn't want to try lower dose right now because she has plenty of the 15 mg left. Advised to let me know next time she needs a refill. Pt agreed.

## 2018-11-03 ENCOUNTER — Ambulatory Visit: Payer: Medicare HMO | Admitting: Internal Medicine

## 2018-11-20 ENCOUNTER — Encounter: Payer: Self-pay | Admitting: Internal Medicine

## 2018-11-20 DIAGNOSIS — I10 Essential (primary) hypertension: Secondary | ICD-10-CM

## 2018-11-20 DIAGNOSIS — E785 Hyperlipidemia, unspecified: Secondary | ICD-10-CM

## 2018-11-20 DIAGNOSIS — D473 Essential (hemorrhagic) thrombocythemia: Secondary | ICD-10-CM

## 2018-11-20 DIAGNOSIS — R739 Hyperglycemia, unspecified: Secondary | ICD-10-CM

## 2018-11-20 DIAGNOSIS — D75839 Thrombocytosis, unspecified: Secondary | ICD-10-CM

## 2018-11-21 NOTE — Telephone Encounter (Signed)
Pt confirmed doing okay. Pt moved appt to august. I have scheduled her for fasting labs per her request 2 days prior to her appt.

## 2018-11-21 NOTE — Telephone Encounter (Signed)
Please call pt and see how she is doing.  I am ok to see her for f/u or she can extend out if desires.

## 2018-11-22 NOTE — Telephone Encounter (Signed)
Orders placed for labs

## 2018-12-03 ENCOUNTER — Ambulatory Visit: Payer: Medicare HMO | Admitting: Internal Medicine

## 2018-12-09 DIAGNOSIS — M25561 Pain in right knee: Secondary | ICD-10-CM | POA: Diagnosis not present

## 2018-12-09 DIAGNOSIS — M170 Bilateral primary osteoarthritis of knee: Secondary | ICD-10-CM | POA: Diagnosis not present

## 2018-12-09 DIAGNOSIS — M25562 Pain in left knee: Secondary | ICD-10-CM | POA: Diagnosis not present

## 2018-12-31 ENCOUNTER — Other Ambulatory Visit: Payer: Self-pay

## 2018-12-31 ENCOUNTER — Ambulatory Visit
Admission: RE | Admit: 2018-12-31 | Discharge: 2018-12-31 | Disposition: A | Discharge status: Home / Self Care | Payer: Medicare HMO | Source: Ambulatory Visit | Attending: Internal Medicine | Admitting: Internal Medicine

## 2018-12-31 DIAGNOSIS — Z1231 Encounter for screening mammogram for malignant neoplasm of breast: Secondary | ICD-10-CM | POA: Diagnosis not present

## 2019-01-19 ENCOUNTER — Other Ambulatory Visit: Payer: Self-pay | Admitting: Internal Medicine

## 2019-02-16 ENCOUNTER — Other Ambulatory Visit: Payer: Self-pay

## 2019-02-16 ENCOUNTER — Other Ambulatory Visit (INDEPENDENT_AMBULATORY_CARE_PROVIDER_SITE_OTHER): Payer: Medicare HMO

## 2019-02-16 DIAGNOSIS — E785 Hyperlipidemia, unspecified: Secondary | ICD-10-CM

## 2019-02-16 DIAGNOSIS — D473 Essential (hemorrhagic) thrombocythemia: Secondary | ICD-10-CM

## 2019-02-16 DIAGNOSIS — R739 Hyperglycemia, unspecified: Secondary | ICD-10-CM

## 2019-02-16 DIAGNOSIS — I10 Essential (primary) hypertension: Secondary | ICD-10-CM | POA: Diagnosis not present

## 2019-02-16 DIAGNOSIS — D75839 Thrombocytosis, unspecified: Secondary | ICD-10-CM

## 2019-02-16 LAB — CBC WITH DIFFERENTIAL/PLATELET
Basophils Absolute: 0.1 10*3/uL (ref 0.0–0.1)
Basophils Relative: 1.2 % (ref 0.0–3.0)
Eosinophils Absolute: 0.2 10*3/uL (ref 0.0–0.7)
Eosinophils Relative: 2.7 % (ref 0.0–5.0)
HCT: 43.1 % (ref 36.0–46.0)
Hemoglobin: 14.7 g/dL (ref 12.0–15.0)
Lymphocytes Relative: 30.7 % (ref 12.0–46.0)
Lymphs Abs: 2 10*3/uL (ref 0.7–4.0)
MCHC: 34.2 g/dL (ref 30.0–36.0)
MCV: 90.1 fl (ref 78.0–100.0)
Monocytes Absolute: 0.5 10*3/uL (ref 0.1–1.0)
Monocytes Relative: 7.8 % (ref 3.0–12.0)
Neutro Abs: 3.8 10*3/uL (ref 1.4–7.7)
Neutrophils Relative %: 57.6 % (ref 43.0–77.0)
Platelets: 507 10*3/uL — ABNORMAL HIGH (ref 150.0–400.0)
RBC: 4.79 Mil/uL (ref 3.87–5.11)
RDW: 14.5 % (ref 11.5–15.5)
WBC: 6.5 10*3/uL (ref 4.0–10.5)

## 2019-02-16 LAB — BASIC METABOLIC PANEL
BUN: 26 mg/dL — ABNORMAL HIGH (ref 6–23)
CO2: 23 mEq/L (ref 19–32)
Calcium: 9.5 mg/dL (ref 8.4–10.5)
Chloride: 105 mEq/L (ref 96–112)
Creatinine, Ser: 0.72 mg/dL (ref 0.40–1.20)
GFR: 79.09 mL/min (ref >60.00)
Glucose, Bld: 104 mg/dL — ABNORMAL HIGH (ref 70–99)
Potassium: 4.3 mEq/L (ref 3.5–5.1)
Sodium: 138 mEq/L (ref 135–145)

## 2019-02-16 LAB — LIPID PANEL
Cholesterol: 153 mg/dL (ref 0–200)
HDL: 38.5 mg/dL — ABNORMAL LOW (ref >39.00)
LDL Cholesterol: 83 mg/dL (ref 0–99)
NonHDL: 114.95
Total CHOL/HDL Ratio: 4
Triglycerides: 159 mg/dL — ABNORMAL HIGH (ref 0.0–149.0)
VLDL: 31.8 mg/dL (ref 0.0–40.0)

## 2019-02-16 LAB — HEPATIC FUNCTION PANEL
ALT: 18 U/L (ref 0–35)
AST: 19 U/L (ref 0–37)
Albumin: 4.1 g/dL (ref 3.5–5.2)
Alkaline Phosphatase: 71 U/L (ref 39–117)
Bilirubin, Direct: 0.1 mg/dL (ref 0.0–0.3)
Total Bilirubin: 0.5 mg/dL (ref 0.2–1.2)
Total Protein: 6.7 g/dL (ref 6.0–8.3)

## 2019-02-16 LAB — HEMOGLOBIN A1C: Hgb A1c MFr Bld: 6.1 % (ref 4.6–6.5)

## 2019-02-18 ENCOUNTER — Encounter: Payer: Self-pay | Admitting: Internal Medicine

## 2019-02-18 ENCOUNTER — Ambulatory Visit (INDEPENDENT_AMBULATORY_CARE_PROVIDER_SITE_OTHER): Payer: Medicare HMO | Admitting: Internal Medicine

## 2019-02-18 ENCOUNTER — Other Ambulatory Visit: Payer: Self-pay

## 2019-02-18 DIAGNOSIS — I10 Essential (primary) hypertension: Secondary | ICD-10-CM | POA: Diagnosis not present

## 2019-02-18 DIAGNOSIS — Z800 Family history of malignant neoplasm of digestive organs: Secondary | ICD-10-CM

## 2019-02-18 DIAGNOSIS — M25562 Pain in left knee: Secondary | ICD-10-CM | POA: Diagnosis not present

## 2019-02-18 DIAGNOSIS — R739 Hyperglycemia, unspecified: Secondary | ICD-10-CM | POA: Diagnosis not present

## 2019-02-18 DIAGNOSIS — D473 Essential (hemorrhagic) thrombocythemia: Secondary | ICD-10-CM

## 2019-02-18 DIAGNOSIS — M25561 Pain in right knee: Secondary | ICD-10-CM

## 2019-02-18 DIAGNOSIS — E785 Hyperlipidemia, unspecified: Secondary | ICD-10-CM | POA: Diagnosis not present

## 2019-02-18 DIAGNOSIS — D75839 Thrombocytosis, unspecified: Secondary | ICD-10-CM

## 2019-02-18 NOTE — Progress Notes (Signed)
[name], female   DOB: 1945/10/28, 73 y.o.   MRN: [id number]   Subjective:    [name], female    DOB: 1946/02/06, 73 y.o.   MRN: [id number]  HPI  Patient here for a scheduled follow up. She reports she is doing relatively well.  Frustrated because her knee surgery had to be postponed.  Discussed weight loss.  Discussed diet and exercise.  She is not taking phentermine.  No chest pain. No sob.  No acid reflux.  No abdominal pain.  Bowels moving.  Discussed recent lab results.  Tolerating simvastatin.  Platelet count remains elevated.  Last check - 507.  Discussed referral to hematology.     Past Medical History:  Diagnosis Date  . Blood in the stool    10 yrs ago  . Diverticulitis   . History of abnormal Pap smear   . History of chicken pox   . History of colon polyps   . Hypercholesterolemia   . Seronegative rheumatoid arthritis [facility])    Past Surgical History:  Procedure Laterality Date  . DILATION AND CURETTAGE OF UTERUS    . LEG / ANKLE SOFT TISSUE BIOPSY  2011   to confirm arthritis   Family History  Problem Relation Age of Onset  . Arthritis Father   . Hyperlipidemia Father   . Heart disease Father   . Hypertension Father   . Diabetes Father   . Pulmonary fibrosis Father   . Arthritis Mother   . Heart disease Mother   . Hyperlipidemia Mother   . Hypertension Mother   . Colon cancer Maternal Aunt   . Colon cancer Maternal Uncle   . Breast cancer Neg Hx    Social History   Socioeconomic History  . Marital status: Divorced    Spouse name: Not on file  . Number of children: Not on file  . Years of education: Not on file  . Highest education level: Not on file  Occupational History  . Not on file  Social Needs  . Financial resource strain: Not on file  . Food insecurity    Worry: Not on file    Inability: Not on file  . Transportation needs    Medical: Not on file    Non-medical: Not on file  Tobacco Use  . Smoking status:  Never Smoker  . Smokeless tobacco: Never Used  Substance and Sexual Activity  . Alcohol use: No    Alcohol/week: 0.0 standard drinks  . Drug use: No  . Sexual activity: Not on file  Lifestyle  . Physical activity    Days per week: Not on file    Minutes per session: Not on file  . Stress: Not on file  Relationships  . Social Herbalist on phone: Not on file    Gets together: Not on file    Attends religious service: Not on file    Active member of club or organization: Not on file    Attends meetings of clubs or organizations: Not on file    Relationship status: Not on file  Other Topics Concern  . Not on file  Social History Narrative  . Not on file    Outpatient Encounter Medications as of 02/18/2019  Medication Sig  . aspirin 81 MG chewable tablet Chew 81 mg by mouth daily.  . calcium elemental as carbonate (TUMS ULTRA 1000) 400 MG tablet Chew 1,000 mg by mouth daily.    Marland Kitchen  CHROMIUM PICOLINATE PO Take by mouth.  . Doxylamine Succinate, Sleep, (SLEEP AID PO) OTC as directed.   Marland Kitchen lisinopril (ZESTRIL) 10 MG tablet TAKE 1 TABLET DAILY  . meloxicam (MOBIC) 15 MG tablet TAKE 1 TABLET DAILY  . Multiple Vitamins-Minerals (CENTRUM ULTRA WOMENS PO) Take by mouth.  . Omega-3 Fatty Acids (RA FISH OIL) 1000 MG CAPS Take by mouth.  . pantoprazole (PROTONIX) 40 MG tablet TAKE 1 TABLET DAILY  . Semaglutide,0.25 or 0.5MG/DOS, (OZEMPIC, 0.25 OR 0.5 MG/DOSE,) 2 MG/1.5ML SOPN Inject 0.5 mg into the skin once a week.  . simvastatin (ZOCOR) 20 MG tablet TAKE 1 TABLET EVERY EVENING  . traMADol (ULTRAM) 50 MG tablet 1-2 po bid prn  . [DISCONTINUED] phentermine 30 MG capsule Take 1 capsule (30 mg total) by mouth daily.   No facility-administered encounter medications on file as of 02/18/2019.     Review of Systems  Constitutional: Negative for appetite change and unexpected weight change.  HENT: Negative for congestion and sinus pressure.   Respiratory: Negative for cough, chest  tightness and shortness of breath.   Cardiovascular: Negative for chest pain, palpitations and leg swelling.  Gastrointestinal: Negative for abdominal pain, diarrhea, nausea and vomiting.  Genitourinary: Negative for difficulty urinating and dysuria.  Musculoskeletal: Negative for joint swelling and myalgias.  Skin: Negative for color change and rash.  Neurological: Negative for dizziness, light-headedness and headaches.  Psychiatric/Behavioral: Negative for agitation and dysphoric mood.       Objective:    Physical Exam Constitutional:      General: She is not in acute distress.    Appearance: Normal appearance.  HENT:     Right Ear: External ear normal.     Left Ear: External ear normal.  Eyes:     General: No scleral icterus.       Right eye: No discharge.        Left eye: No discharge.     Conjunctiva/sclera: Conjunctivae normal.  Neck:     Musculoskeletal: Neck supple. No muscular tenderness.     Thyroid: No thyromegaly.  Cardiovascular:     Rate and Rhythm: Normal rate and regular rhythm.  Pulmonary:     Effort: No respiratory distress.     Breath sounds: Normal breath sounds. No wheezing.  Abdominal:     General: Bowel sounds are normal.     Palpations: Abdomen is soft.     Tenderness: There is no abdominal tenderness.  Musculoskeletal:        General: No swelling or tenderness.  Lymphadenopathy:     Cervical: No cervical adenopathy.  Neurological:     Mental Status: She is alert.  Psychiatric:        Mood and Affect: Mood normal.        Behavior: Behavior normal.     BP 114/68   Pulse 93   Temp 98 F (36.7 C) (Oral)   Resp 16   Ht '5\' 1"'  (1.549 m)   Wt 166 lb (75.3 kg)   LMP 07/16/1996   SpO2 96%   BMI 31.37 kg/m  Wt Readings from Last 3 Encounters:  02/18/19 166 lb (75.3 kg)  09/25/18 168 lb 9.6 oz (76.5 kg)  08/18/18 170 lb 3.2 oz (77.2 kg)     Lab Results  Component Value Date   WBC 6.5 02/16/2019   HGB 14.7 02/16/2019   HCT 43.1  02/16/2019   PLT 507.0 (H) 02/16/2019   GLUCOSE 104 (H) 02/16/2019   CHOL 153 02/16/2019  TRIG 159.0 (H) 02/16/2019   HDL 38.50 (L) 02/16/2019   LDLDIRECT 153.7 11/15/2010   LDLCALC 83 02/16/2019   ALT 18 02/16/2019   AST 19 02/16/2019   NA 138 02/16/2019   K 4.3 02/16/2019   CL 105 02/16/2019   CREATININE 0.72 02/16/2019   BUN 26 (H) 02/16/2019   CO2 23 02/16/2019   TSH 1.40 08/12/2018   INR 1.1 (H) 06/21/2016   HGBA1C 6.1 02/16/2019    Mm 3d Screen Breast Bilateral  Result Date: 12/31/2018 CLINICAL DATA:  Screening. EXAM: DIGITAL SCREENING BILATERAL MAMMOGRAM WITH TOMO AND CAD COMPARISON:  Previous exam(s). ACR Breast Density Category b: There are scattered areas of fibroglandular density. FINDINGS: There are no findings suspicious for malignancy. Images were processed with CAD. IMPRESSION: No mammographic evidence of malignancy. A result letter of this screening mammogram will be mailed directly to the patient. RECOMMENDATION: Screening mammogram in one year. (Code:SM-B-01Y) BI-RADS CATEGORY  1: Negative. Electronically Signed   By: Fidela Salisbury M.D.   On: 12/31/2018 16:42       Assessment & Plan:   Problem List Items Addressed This Visit    Family history of colon cancer    Colonoscopy 2010.  Due f/u colonoscopy.  Will notify me when agreeable.  Wanted to hold.        Hyperglycemia    Low carb diet and exercise.  Follow met b and a1c.       Relevant Orders   Hemoglobin A1c   Hyperlipidemia    Low cholesterol diet and exercise.  On simvastatin.  Follow lipid panel and liver function tests.        Relevant Orders   Hepatic function panel   Lipid panel   Hypertension    Blood pressure doing well.  Continue current medication.  Follow pressures.  Follow metabolic panel.        Relevant Orders   CBC with Differential/Platelet   TSH   Basic metabolic panel   Knee pain, bilateral    Has seen ortho.  Planning for surgery in 04/2019.  Was postponed.         Thrombocytosis (HCC)    Persistent elevated platelet count.  507 on recent check.  Refer to hematology.        Relevant Orders   Ambulatory referral to Hematology       Einar Pheasant, MD

## 2019-02-22 ENCOUNTER — Encounter: Payer: Self-pay | Admitting: Internal Medicine

## 2019-02-22 NOTE — Assessment & Plan Note (Signed)
Low cholesterol diet and exercise.  On simvastatin.  Follow lipid panel and liver function tests.   

## 2019-02-22 NOTE — Assessment & Plan Note (Signed)
Persistent elevated platelet count.  507 on recent check.  Refer to hematology.

## 2019-02-22 NOTE — Assessment & Plan Note (Signed)
Low carb diet and exercise.  Follow met b and a1c.  

## 2019-02-22 NOTE — Assessment & Plan Note (Signed)
Colonoscopy 2010.  Due f/u colonoscopy.  Will notify me when agreeable.  Wanted to hold.

## 2019-02-22 NOTE — Assessment & Plan Note (Signed)
Has seen ortho.  Planning for surgery in 04/2019.  Was postponed.

## 2019-02-22 NOTE — Assessment & Plan Note (Signed)
Blood pressure doing well.  Continue current medication.  Follow pressures.  Follow metabolic panel.

## 2019-02-26 ENCOUNTER — Other Ambulatory Visit: Payer: Self-pay

## 2019-02-27 ENCOUNTER — Inpatient Hospital Stay: Payer: Medicare HMO | Attending: Oncology | Admitting: Oncology

## 2019-02-27 ENCOUNTER — Other Ambulatory Visit: Payer: Self-pay

## 2019-02-27 ENCOUNTER — Encounter: Payer: Self-pay | Admitting: Oncology

## 2019-02-27 ENCOUNTER — Inpatient Hospital Stay: Payer: Medicare HMO

## 2019-02-27 VITALS — BP 120/87 | HR 125 | Temp 97.0°F | Resp 18 | Ht 61.81 in | Wt 165.7 lb

## 2019-02-27 DIAGNOSIS — Z800 Family history of malignant neoplasm of digestive organs: Secondary | ICD-10-CM

## 2019-02-27 DIAGNOSIS — Z86010 Personal history of colonic polyps: Secondary | ICD-10-CM | POA: Insufficient documentation

## 2019-02-27 DIAGNOSIS — Z7982 Long term (current) use of aspirin: Secondary | ICD-10-CM | POA: Insufficient documentation

## 2019-02-27 DIAGNOSIS — I10 Essential (primary) hypertension: Secondary | ICD-10-CM | POA: Diagnosis not present

## 2019-02-27 DIAGNOSIS — E785 Hyperlipidemia, unspecified: Secondary | ICD-10-CM | POA: Insufficient documentation

## 2019-02-27 DIAGNOSIS — D75839 Thrombocytosis, unspecified: Secondary | ICD-10-CM

## 2019-02-27 DIAGNOSIS — D473 Essential (hemorrhagic) thrombocythemia: Secondary | ICD-10-CM | POA: Diagnosis not present

## 2019-02-27 LAB — SEDIMENTATION RATE: Sed Rate: 18 mm/hr (ref 0–30)

## 2019-02-27 LAB — CBC WITH DIFFERENTIAL/PLATELET
Abs Immature Granulocytes: 0.01 10*3/uL (ref 0.00–0.07)
Basophils Absolute: 0.1 10*3/uL (ref 0.0–0.1)
Basophils Relative: 1 %
Eosinophils Absolute: 0.2 10*3/uL (ref 0.0–0.5)
Eosinophils Relative: 2 %
HCT: 44.4 % (ref 36.0–46.0)
Hemoglobin: 14.9 g/dL (ref 12.0–15.0)
Immature Granulocytes: 0 %
Lymphocytes Relative: 29 %
Lymphs Abs: 1.8 10*3/uL (ref 0.7–4.0)
MCH: 29.7 pg (ref 26.0–34.0)
MCHC: 33.6 g/dL (ref 30.0–36.0)
MCV: 88.4 fL (ref 80.0–100.0)
Monocytes Absolute: 0.5 10*3/uL (ref 0.1–1.0)
Monocytes Relative: 8 %
Neutro Abs: 3.7 10*3/uL (ref 1.7–7.7)
Neutrophils Relative %: 60 %
Platelets: 481 10*3/uL — ABNORMAL HIGH (ref 150–400)
RBC: 5.02 MIL/uL (ref 3.87–5.11)
RDW: 14.2 % (ref 11.5–15.5)
WBC: 6.3 10*3/uL (ref 4.0–10.5)
nRBC: 0 % (ref 0.0–0.2)

## 2019-02-27 LAB — IRON AND TIBC
Iron: 78 ug/dL (ref 28–170)
Saturation Ratios: 25 % (ref 10.4–31.8)
TIBC: 314 ug/dL (ref 250–450)
UIBC: 236 ug/dL

## 2019-02-27 LAB — COMPREHENSIVE METABOLIC PANEL
ALT: 21 U/L (ref 0–44)
AST: 22 U/L (ref 15–41)
Albumin: 4.2 g/dL (ref 3.5–5.0)
Alkaline Phosphatase: 68 U/L (ref 38–126)
Anion gap: 9 (ref 5–15)
BUN: 26 mg/dL — ABNORMAL HIGH (ref 8–23)
CO2: 25 mmol/L (ref 22–32)
Calcium: 9.3 mg/dL (ref 8.9–10.3)
Chloride: 104 mmol/L (ref 98–111)
Creatinine, Ser: 0.6 mg/dL (ref 0.44–1.00)
GFR calc Af Amer: >60 mL/min (ref >60)
GFR calc non Af Amer: >60 mL/min (ref >60)
Glucose, Bld: 151 mg/dL — ABNORMAL HIGH (ref 70–99)
Potassium: 3.7 mmol/L (ref 3.5–5.1)
Sodium: 138 mmol/L (ref 135–145)
Total Bilirubin: 0.6 mg/dL (ref 0.3–1.2)
Total Protein: 7.3 g/dL (ref 6.5–8.1)

## 2019-02-27 LAB — FERRITIN: Ferritin: 34 ng/mL (ref 11–307)

## 2019-02-27 NOTE — Progress Notes (Signed)
Here for elevated platelets work up.  Heart rate is 125 in office today and she states she is nervous.

## 2019-02-27 NOTE — Progress Notes (Signed)
Hematology/Oncology Consult note [facility] [phone] [phone]  Patient Care Team: Einar Pheasant, MD as PCP - General (Internal Medicine)   Name of the patient: Caitlin Bennett  [id number]  [birth year]    Reason for referral-thrombocytosis   Referring physician-Dr. Einar Pheasant  Date of visit: 02/27/19   History of presenting illness- Patient is a 73 year old female with a past medical history significant for hypertension, hyperlipidemia referred to [place] for thrombocytosis.  Looking back at her CBC over the last 4 years patient has had consistent mild thrombocytosis and her platelet counts have been mostly between 122 430.  More recently in September 2019 her platelet count was 451 followed by 498 in January 2020 and 507 in August 2020.  She has had a normal white count with a normal differential and a normal hemoglobin as well.  No personal history of DVT PE heart attacks or strokes.  Overall she feels well and her appetite and weight have been stable.  Denies any fatigue or unintentional weight loss or drenching night sweats.  Denies any abdominal pain.  He does have ongoing back and knee pain for which she sees orthopedics.  He has a surgery for her knee is in October 2020.  ECOG PS- 0  Pain scale- 0   Review of systems- Review of Systems  Constitutional: Negative for chills, fever, malaise/fatigue and weight loss.  HENT: Negative for congestion, ear discharge and nosebleeds.   Eyes: Negative for blurred vision.  Respiratory: Negative for cough, hemoptysis, sputum production, shortness of breath and wheezing.   Cardiovascular: Negative for chest pain, palpitations, orthopnea and claudication.  Gastrointestinal: Negative for abdominal pain, blood in stool, constipation, diarrhea, heartburn, melena, nausea and vomiting.  Genitourinary: Negative for dysuria, flank pain, frequency, hematuria and urgency.  Musculoskeletal: Negative for back  pain, joint pain and myalgias.  Skin: Negative for rash.  Neurological: Negative for dizziness, tingling, focal weakness, seizures, weakness and headaches.  Endo/Heme/Allergies: Does not bruise/bleed easily.  Psychiatric/Behavioral: Negative for depression and suicidal ideas. The patient does not have insomnia.     Allergies  Allergen Reactions  . Sumycin [Tetracycline Hcl] Rash    Patient Active Problem List   Diagnosis Date Noted  . Diverticulitis 06/29/2018  . Hyperlipidemia 06/29/2018  . History of colon polyps 06/29/2018  . Thrombocytosis ([place]) 12/09/2017  . Hyperglycemia 12/19/2016  . Hypertension 09/16/2016  . Headache 09/03/2016  . Microcytosis 07/05/2016  . Knee pain, bilateral 06/21/2016  . Primary osteoarthritis of both knees 10/25/2015  . History of nonmelanoma skin cancer 10/12/2015  . Left hip pain 06/11/2015  . Health care maintenance 11/29/2014  . BMI 31.0-31.9,adult 06/06/2014  . DDD (degenerative disc disease), lumbosacral 01/19/2014  . Lumbosacral spondylosis 01/19/2014  . Overactive bladder 03/19/2013  . Family history of colon cancer 03/19/2013  . Insomnia 10/31/2010  . Screening for diabetes mellitus 10/31/2010  . Breast cancer screening 10/31/2010  . Arthritis   . Blood in the stool      Past Medical History:  Diagnosis Date  . Blood in the stool    10 yrs ago  . Diverticulitis   . History of abnormal Pap smear   . History of chicken pox   . History of colon polyps   . Hypercholesterolemia   . Hypertension   . Seronegative rheumatoid arthritis [facility])      Past Surgical History:  Procedure Laterality Date  . DILATION AND CURETTAGE OF UTERUS    . LEG / ANKLE SOFT TISSUE BIOPSY  2011   to confirm arthritis    Social History   Socioeconomic History  . Marital status: Divorced    Spouse name: Not on file  . Number of children: Not on file  . Years of education: Not on file  . Highest education level: Not on file  Occupational  History  . Not on file  Social Needs  . Financial resource strain: Not on file  . Food insecurity    Worry: Not on file    Inability: Not on file  . Transportation needs    Medical: Not on file    Non-medical: Not on file  Tobacco Use  . Smoking status: Never Smoker  . Smokeless tobacco: Never Used  Substance and Sexual Activity  . Alcohol use: No    Alcohol/week: 0.0 standard drinks  . Drug use: No  . Sexual activity: Not on file  Lifestyle  . Physical activity    Days per week: Not on file    Minutes per session: Not on file  . Stress: Not on file  Relationships  . Social Herbalist on phone: Not on file    Gets together: Not on file    Attends religious service: Not on file    Active member of club or organization: Not on file    Attends meetings of clubs or organizations: Not on file    Relationship status: Not on file  . Intimate partner violence    Fear of current or ex partner: Not on file    Emotionally abused: Not on file    Physically abused: Not on file    Forced sexual activity: Not on file  Other Topics Concern  . Not on file  Social History Narrative  . Not on file     Family History  Problem Relation Age of Onset  . Arthritis Father   . Hyperlipidemia Father   . Heart disease Father   . Hypertension Father   . Diabetes Father   . Pulmonary fibrosis Father   . Arthritis Mother   . Heart disease Mother   . Hyperlipidemia Mother   . Hypertension Mother   . Colon cancer Maternal Aunt   . Colon cancer Maternal Uncle   . Breast cancer Neg Hx      Current Outpatient Medications:  .  aspirin 81 MG chewable tablet, Chew 81 mg by mouth daily., Disp: , Rfl:  .  calcium elemental as carbonate (TUMS ULTRA 1000) 400 MG tablet, Chew 1,000 mg by mouth daily.  , Disp: , Rfl:  .  Doxylamine Succinate, Sleep, (SLEEP AID PO), OTC as directed. , Disp: , Rfl:  .  lisinopril (ZESTRIL) 10 MG tablet, TAKE 1 TABLET DAILY, Disp: 90 tablet, Rfl: 1 .   meloxicam (MOBIC) 15 MG tablet, TAKE 1 TABLET DAILY, Disp: 90 tablet, Rfl: 0 .  Multiple Vitamins-Minerals (CENTRUM ULTRA WOMENS PO), Take by mouth., Disp: , Rfl:  .  Omega-3 Fatty Acids (RA FISH OIL) 1000 MG CAPS, Take by mouth., Disp: , Rfl:  .  pantoprazole (PROTONIX) 40 MG tablet, TAKE 1 TABLET DAILY, Disp: 90 tablet, Rfl: 1 .  simvastatin (ZOCOR) 20 MG tablet, TAKE 1 TABLET EVERY EVENING, Disp: 90 tablet, Rfl: 1 .  traMADol (ULTRAM) 50 MG tablet, 1-2 po bid prn, Disp: , Rfl:  .  vitamin E (VITAMIN E) 400 UNIT capsule, Take 400 Units by mouth daily., Disp: , Rfl:  .  CHROMIUM PICOLINATE PO, Take by mouth., Disp: ,  Rfl:  .  Semaglutide,0.25 or 0.5MG/DOS, (OZEMPIC, 0.25 OR 0.5 MG/DOSE,) 2 MG/1.5ML SOPN, Inject 0.5 mg into the skin once a week. (Patient not taking: Reported on 02/27/2019), Disp: 1 pen, Rfl: 1   Physical exam:  Vitals:   02/27/19 1335  BP: 120/87  Pulse: (!) 125  Resp: 18  Temp: (!) 97 F (36.1 C)  Weight: 165 lb 11.2 oz (75.2 kg)  Height: 5' 1.81" (1.57 m)   Physical Exam HENT:     Head: Normocephalic and atraumatic.  Eyes:     Pupils: Pupils are equal, round, and reactive to light.  Neck:     Musculoskeletal: Normal range of motion.  Cardiovascular:     Rate and Rhythm: Normal rate and regular rhythm.     Heart sounds: Normal heart sounds.  Pulmonary:     Effort: Pulmonary effort is normal.     Breath sounds: Normal breath sounds.  Abdominal:     General: Bowel sounds are normal.     Palpations: Abdomen is soft.     Comments: No palpable splenomegaly  Skin:    General: Skin is warm and dry.  Neurological:     Mental Status: She is alert and oriented to person, place, and time.        CMP Latest Ref Rng & Units 02/16/2019  Glucose 70 - 99 mg/dL 104(H)  BUN 6 - 23 mg/dL 26(H)  Creatinine 0.40 - 1.20 mg/dL 0.72  Sodium 135 - 145 mEq/L 138  Potassium 3.5 - 5.1 mEq/L 4.3  Chloride 96 - 112 mEq/L 105  CO2 19 - 32 mEq/L 23  Calcium 8.4 - 10.5 mg/dL 9.5   Total Protein 6.0 - 8.3 g/dL 6.7  Total Bilirubin 0.2 - 1.2 mg/dL 0.5  Alkaline Phos 39 - 117 U/L 71  AST 0 - 37 U/L 19  ALT 0 - 35 U/L 18   CBC Latest Ref Rng & Units 02/16/2019  WBC 4.0 - 10.5 K/uL 6.5  Hemoglobin 12.0 - 15.0 g/dL 14.7  Hematocrit 36.0 - 46.0 % 43.1  Platelets 150.0 - 400.0 K/uL 507.0(H)     Assessment and plan- Patient is a 73 y.o. female referred for thrombocytosis  Discussed differences between primary versus secondary thrombocytosis patient has had a mild persistent thrombocytosis with a platelet count of greater than 400 but mostly between 430-450 over the last 3 years.  Her most recent platelet count was 507.  I would like to check a CBC with differential, CMP and do further work-up to rule out primary myeloproliferative neoplasm by checking a Jak 2, CALR and MPL mutation as well as BCR ABL FISH testing.  I will also check ferritin and iron studies and ESR to rule out secondary thrombocytosis.  Nonspecific inflammation/infection or iron deficiency or some other reasons for reactive thrombocytosis.  I will see the patient back in 2 weeks time.  Patient is already taking a low-dose aspirin which she can continue at this time.   Thank you for this kind referral and the opportunity to participate in the care of this patient   Visit Diagnosis 1. Thrombocytosis ([place])     Dr. Randa Evens, MD, MPH [facility] at [facility] [phone] 02/27/2019 2:00 PM

## 2019-03-04 LAB — JAK2 GENOTYPR

## 2019-03-09 LAB — MPL MUTATION ANALYSIS

## 2019-03-10 LAB — CALRETICULIN (CALR) MUTATION ANALYSIS

## 2019-03-11 LAB — BCR-ABL1 FISH
Cells Analyzed: 200
Cells Counted: 200

## 2019-03-16 ENCOUNTER — Other Ambulatory Visit: Payer: Self-pay

## 2019-03-16 ENCOUNTER — Inpatient Hospital Stay: Payer: Medicare HMO | Admitting: Oncology

## 2019-03-16 ENCOUNTER — Encounter: Payer: Self-pay | Admitting: Oncology

## 2019-03-16 VITALS — BP 130/92 | HR 74 | Temp 97.8°F | Ht 61.0 in | Wt 163.0 lb

## 2019-03-16 DIAGNOSIS — D473 Essential (hemorrhagic) thrombocythemia: Secondary | ICD-10-CM | POA: Diagnosis not present

## 2019-03-16 DIAGNOSIS — D75839 Thrombocytosis, unspecified: Secondary | ICD-10-CM

## 2019-03-16 NOTE — Progress Notes (Signed)
Hematology/Oncology Consult note [facility]  Telephone:([phone] [phone]  Patient Care Team: Einar Pheasant, MD as PCP - General (Internal Medicine)   Name of the patient: Caitlin Bennett  [id number]  02/03/1946   Date of visit: 03/16/19  Diagnosis-thrombocytosis of unclear etiology  Chief complaint/ Reason for visit-discuss results of blood work  Heme/Onc history: Patient is a 73 year old female with a past medical history significant for hypertension, hyperlipidemia referred to [place] for thrombocytosis.  Looking back at her CBC over the last 4 years patient has had consistent mild thrombocytosis and her platelet counts have been mostly between 122 430.  More recently in September 2019 her platelet count was 451 followed by 498 in January 2020 and 507 in August 2020.  She has had a normal white count with a normal differential and a normal hemoglobin as well.  No personal history of DVT PE heart attacks or strokes.  Overall she feels well and her appetite and weight have been stable.  Denies any fatigue or unintentional weight loss or drenching night sweats.  Denies any abdominal pain.  He does have ongoing back and knee pain for which she sees orthopedics.  He has a surgery for her knee is in October 2020.  Jak 2 mutation testing as well as CAL R and MPL mutation testing was negative.  BCR able gene rearrangement was negative.  CMP was normal.  Ferritin and iron studies were normal.  ESR was normal.  Results of blood work from 02/27/2019 were as follows: CBC showed white count of 6.3 with a normal differential, H&H of 14.9/44.4 and a platelet count of 481.  Interval history-no new complaints since her last visit.  She does have chronic knee pain and will be undergoing Knee surgery in October 2020.  ECOG PS- 1 Pain scale- 0   Review of systems- Review of Systems  Constitutional: Negative for chills, fever, malaise/fatigue and weight loss.  HENT:  Negative for congestion, ear discharge and nosebleeds.   Eyes: Negative for blurred vision.  Respiratory: Negative for cough, hemoptysis, sputum production, shortness of breath and wheezing.   Cardiovascular: Negative for chest pain, palpitations, orthopnea and claudication.  Gastrointestinal: Negative for abdominal pain, blood in stool, constipation, diarrhea, heartburn, melena, nausea and vomiting.  Genitourinary: Negative for dysuria, flank pain, frequency, hematuria and urgency.  Musculoskeletal: Positive for back pain and joint pain. Negative for myalgias.  Skin: Negative for rash.  Neurological: Negative for dizziness, tingling, focal weakness, seizures, weakness and headaches.  Endo/Heme/Allergies: Does not bruise/bleed easily.  Psychiatric/Behavioral: Negative for depression and suicidal ideas. The patient does not have insomnia.       Allergies  Allergen Reactions  . Sumycin [Tetracycline Hcl] Rash     Past Medical History:  Diagnosis Date  . Blood in the stool    10 yrs ago  . Diverticulitis   . History of abnormal Pap smear   . History of chicken pox   . History of colon polyps   . Hypercholesterolemia   . Hypertension   . Seronegative rheumatoid arthritis [facility])      Past Surgical History:  Procedure Laterality Date  . DILATION AND CURETTAGE OF UTERUS    . LEG / ANKLE SOFT TISSUE BIOPSY  2011   to confirm arthritis    Social History   Socioeconomic History  . Marital status: Divorced    Spouse name: Not on file  . Number of children: Not on file  . Years of education:  Not on file  . Highest education level: Not on file  Occupational History  . Not on file  Social Needs  . Financial resource strain: Not on file  . Food insecurity    Worry: Not on file    Inability: Not on file  . Transportation needs    Medical: Not on file    Non-medical: Not on file  Tobacco Use  . Smoking status: Never Smoker  . Smokeless tobacco: Never Used  Substance and  Sexual Activity  . Alcohol use: No    Alcohol/week: 0.0 standard drinks  . Drug use: No  . Sexual activity: Not on file  Lifestyle  . Physical activity    Days per week: Not on file    Minutes per session: Not on file  . Stress: Not on file  Relationships  . Social Herbalist on phone: Not on file    Gets together: Not on file    Attends religious service: Not on file    Active member of club or organization: Not on file    Attends meetings of clubs or organizations: Not on file    Relationship status: Not on file  . Intimate partner violence    Fear of current or ex partner: Not on file    Emotionally abused: Not on file    Physically abused: Not on file    Forced sexual activity: Not on file  Other Topics Concern  . Not on file  Social History Narrative  . Not on file    Family History  Problem Relation Age of Onset  . Arthritis Father   . Hyperlipidemia Father   . Heart disease Father   . Hypertension Father   . Diabetes Father   . Pulmonary fibrosis Father   . Arthritis Mother   . Heart disease Mother   . Hyperlipidemia Mother   . Hypertension Mother   . Colon cancer Maternal Aunt   . Colon cancer Maternal Uncle   . Breast cancer Neg Hx      Current Outpatient Medications:  .  aspirin 81 MG chewable tablet, Chew 81 mg by mouth daily., Disp: , Rfl:  .  calcium elemental as carbonate (TUMS ULTRA 1000) 400 MG tablet, Chew 1,000 mg by mouth daily.  , Disp: , Rfl:  .  Doxylamine Succinate, Sleep, (SLEEP AID PO), OTC as directed. , Disp: , Rfl:  .  lisinopril (ZESTRIL) 10 MG tablet, TAKE 1 TABLET DAILY, Disp: 90 tablet, Rfl: 1 .  meloxicam (MOBIC) 15 MG tablet, TAKE 1 TABLET DAILY, Disp: 90 tablet, Rfl: 0 .  Multiple Vitamins-Minerals (CENTRUM ULTRA WOMENS PO), Take by mouth., Disp: , Rfl:  .  Omega-3 Fatty Acids (RA FISH OIL) 1000 MG CAPS, Take by mouth., Disp: , Rfl:  .  pantoprazole (PROTONIX) 40 MG tablet, TAKE 1 TABLET DAILY, Disp: 90 tablet, Rfl: 1  .  simvastatin (ZOCOR) 20 MG tablet, TAKE 1 TABLET EVERY EVENING, Disp: 90 tablet, Rfl: 1 .  traMADol (ULTRAM) 50 MG tablet, 1-2 po bid prn, Disp: , Rfl:  .  vitamin E (VITAMIN E) 400 UNIT capsule, Take 400 Units by mouth daily., Disp: , Rfl:   Physical exam:  Vitals:   03/16/19 1317  BP: (!) 130/92  Pulse: 74  Temp: 97.8 F (36.6 C)  TempSrc: Tympanic  Weight: 163 lb (73.9 kg)  Height: '5\' 1"'  (1.549 m)   Physical Exam Constitutional:      General: She is not in  acute distress. HENT:     Head: Normocephalic and atraumatic.  Eyes:     Pupils: Pupils are equal, round, and reactive to light.  Neck:     Musculoskeletal: Normal range of motion.  Cardiovascular:     Rate and Rhythm: Normal rate and regular rhythm.     Heart sounds: Normal heart sounds.  Pulmonary:     Effort: Pulmonary effort is normal.     Breath sounds: Normal breath sounds.  Abdominal:     General: Bowel sounds are normal.     Palpations: Abdomen is soft.  Skin:    General: Skin is warm and dry.  Neurological:     Mental Status: She is alert and oriented to person, place, and time.      CMP Latest Ref Rng & Units 02/27/2019  Glucose 70 - 99 mg/dL 151(H)  BUN 8 - 23 mg/dL 26(H)  Creatinine 0.44 - 1.00 mg/dL 0.60  Sodium 135 - 145 mmol/L 138  Potassium 3.5 - 5.1 mmol/L 3.7  Chloride 98 - 111 mmol/L 104  CO2 22 - 32 mmol/L 25  Calcium 8.9 - 10.3 mg/dL 9.3  Total Protein 6.5 - 8.1 g/dL 7.3  Total Bilirubin 0.3 - 1.2 mg/dL 0.6  Alkaline Phos 38 - 126 U/L 68  AST 15 - 41 U/L 22  ALT 0 - 44 U/L 21   CBC Latest Ref Rng & Units 02/27/2019  WBC 4.0 - 10.5 K/uL 6.3  Hemoglobin 12.0 - 15.0 g/dL 14.9  Hematocrit 36.0 - 46.0 % 44.4  Platelets 150 - 400 K/uL 481(H)      Assessment and plan- Patient is a 73 y.o. female referred for thrombocytosis of unclear etiology  I discussed the results of the blood work with the patient in detail.  Patient has had mild thrombocytosis and a platelet count fluctuates  between 4 30-500 over the last 2 years.  During her last visit her platelet count was 481 but prior to that her platelet count was 507 when checked at Dr. Bary Leriche office.  Overall her platelet counts have remained stable in the high 400s and have not shown a consistent rising trend.  No personal history of any thrombosis.  Her Jak 2, CAL R and MPL mutation testing was negative.  BCR ABL gene rearrangement was also negative.  Suspicion for primary myeloproliferative disorder is low.  I will consider a bone marrow to rule out a myeloproliferative disorder if there is a consistent rise in her platelet counts.  Patient is currently on low-dose aspirin which she will continue.  As such there is no contraindication for her to proceed with her knee surgery from a hematologic standpoint in October 2020.  I will see her back in 4 months with a CBC with differential.  She can get another CBC with differential checked right before her knee surgery   Visit Diagnosis 1. Thrombocytosis ([place])      Dr. Randa Evens, MD, MPH [facility] [place] [phone] 03/16/2019 1:37 PM

## 2019-03-16 NOTE — Progress Notes (Signed)
Patient stated that she will have a knee replacement on October 28th of 2020.

## 2019-03-18 ENCOUNTER — Other Ambulatory Visit: Payer: Self-pay | Admitting: Internal Medicine

## 2019-04-01 DIAGNOSIS — R69 Illness, unspecified: Secondary | ICD-10-CM | POA: Diagnosis not present

## 2019-04-03 ENCOUNTER — Ambulatory Visit: Payer: Medicare HMO

## 2019-04-13 ENCOUNTER — Ambulatory Visit: Payer: Medicare HMO

## 2019-04-26 NOTE — Discharge Instructions (Signed)
°  Instructions after Total Knee Replacement ° ° Zeeva Courser P. Rayburn Mundis, Jr., M.D.    ° Dept. of Orthopaedics & Sports Medicine ° [facility] ° [address] ° [place], [place]  [address] ° Phone: [phone]   Fax: [phone] ° °  °DIET: °• Drink plenty of non-alcoholic fluids. °• Resume your normal diet. Include foods high in fiber. ° °ACTIVITY:  °• You may use crutches or a walker with weight-bearing as tolerated, unless instructed otherwise. °• You may be weaned off of the walker or crutches by your Physical Therapist.  °• Do NOT place pillows under the knee. Anything placed under the knee could limit your ability to straighten the knee.   °• Continue doing gentle exercises. Exercising will reduce the pain and swelling, increase motion, and prevent muscle weakness.   °• Please continue to use the TED compression stockings for 6 weeks. You may remove the stockings at night, but should reapply them in the morning. °• Do not drive or operate any equipment until instructed. ° °WOUND CARE:  °• Continue to use the PolarCare or ice packs periodically to reduce pain and swelling. °• You may bathe or shower after the staples are removed at the first office visit following surgery. ° °MEDICATIONS: °• You may resume your regular medications. °• Please take the pain medication as prescribed on the medication. °• Do not take pain medication on an empty stomach. °• You have been given a prescription for a blood thinner (Lovenox or Coumadin). Please take the medication as instructed. (NOTE: After completing a 2 week course of Lovenox, take one Enteric-coated aspirin once a day. This along with elevation will help reduce the possibility of phlebitis in your operated leg.) °• Do not drive or drink alcoholic beverages when taking pain medications. ° °CALL THE OFFICE FOR: °• Temperature above 101 degrees °• Excessive bleeding or drainage on the dressing. °• Excessive swelling, coldness, or paleness of the toes. °• Persistent  nausea and vomiting. ° °FOLLOW-UP:  °• You should have an appointment to return to the office in 10-14 days after surgery. °• Arrangements have been made for continuation of Physical Therapy (either home therapy or outpatient therapy). °  °

## 2019-04-28 DIAGNOSIS — M25561 Pain in right knee: Secondary | ICD-10-CM | POA: Diagnosis not present

## 2019-04-29 ENCOUNTER — Encounter
Admission: RE | Admit: 2019-04-29 | Discharge: 2019-04-29 | Disposition: A | Discharge status: Home / Self Care | Payer: Medicare HMO | Source: Ambulatory Visit | Attending: Orthopedic Surgery | Admitting: Orthopedic Surgery

## 2019-04-29 ENCOUNTER — Other Ambulatory Visit: Payer: Self-pay

## 2019-04-29 DIAGNOSIS — Z01818 Encounter for other preprocedural examination: Secondary | ICD-10-CM | POA: Diagnosis not present

## 2019-04-29 HISTORY — DX: Gastro-esophageal reflux disease without esophagitis: K21.9

## 2019-04-29 HISTORY — DX: Personal history of urinary calculi: Z87.442

## 2019-04-29 HISTORY — DX: Spondylolysis, lumbosacral region: M43.07

## 2019-04-29 LAB — CBC
HCT: 45.5 % (ref 36.0–46.0)
Hemoglobin: 15.1 g/dL — ABNORMAL HIGH (ref 12.0–15.0)
MCH: 29.7 pg (ref 26.0–34.0)
MCHC: 33.2 g/dL (ref 30.0–36.0)
MCV: 89.6 fL (ref 80.0–100.0)
Platelets: 547 10*3/uL — ABNORMAL HIGH (ref 150–400)
RBC: 5.08 MIL/uL (ref 3.87–5.11)
RDW: 13.3 % (ref 11.5–15.5)
WBC: 7 10*3/uL (ref 4.0–10.5)
nRBC: 0 % (ref 0.0–0.2)

## 2019-04-29 LAB — SEDIMENTATION RATE: Sed Rate: 11 mm/hr (ref 0–30)

## 2019-04-29 LAB — URINALYSIS, ROUTINE W REFLEX MICROSCOPIC
Bilirubin Urine: NEGATIVE
Glucose, UA: NEGATIVE mg/dL
Hgb urine dipstick: NEGATIVE
Ketones, ur: NEGATIVE mg/dL
Nitrite: NEGATIVE
Protein, ur: NEGATIVE mg/dL
Specific Gravity, Urine: 1.021 (ref 1.005–1.030)
pH: 5 (ref 5.0–8.0)

## 2019-04-29 LAB — SURGICAL PCR SCREEN
MRSA, PCR: NEGATIVE
Staphylococcus aureus: NEGATIVE

## 2019-04-29 LAB — COMPREHENSIVE METABOLIC PANEL
ALT: 24 U/L (ref 0–44)
AST: 24 U/L (ref 15–41)
Albumin: 4.1 g/dL (ref 3.5–5.0)
Alkaline Phosphatase: 75 U/L (ref 38–126)
Anion gap: 8 (ref 5–15)
BUN: 22 mg/dL (ref 8–23)
CO2: 26 mmol/L (ref 22–32)
Calcium: 9.5 mg/dL (ref 8.9–10.3)
Chloride: 104 mmol/L (ref 98–111)
Creatinine, Ser: 0.68 mg/dL (ref 0.44–1.00)
GFR calc Af Amer: >60 mL/min (ref >60)
GFR calc non Af Amer: >60 mL/min (ref >60)
Glucose, Bld: 121 mg/dL — ABNORMAL HIGH (ref 70–99)
Potassium: 3.5 mmol/L (ref 3.5–5.1)
Sodium: 138 mmol/L (ref 135–145)
Total Bilirubin: 0.6 mg/dL (ref 0.3–1.2)
Total Protein: 7.6 g/dL (ref 6.5–8.1)

## 2019-04-29 LAB — PROTIME-INR
INR: 1 (ref 0.8–1.2)
Prothrombin Time: 13.2 seconds (ref 11.4–15.2)

## 2019-04-29 LAB — TYPE AND SCREEN
ABO/RH(D): A POS
Antibody Screen: NEGATIVE

## 2019-04-29 LAB — C-REACTIVE PROTEIN: CRP: 0.9 mg/dL (ref <1.0)

## 2019-04-29 LAB — APTT: aPTT: 33 seconds (ref 24–36)

## 2019-04-29 NOTE — Patient Instructions (Signed)
Your procedure is scheduled on: 05-13-19 [facility] Report to Same Day Surgery 2nd floor medical mall [facility] Entrance-take elevator on left to 2nd floor.  Check in with surgery information desk.) To find out your arrival time please call [phone] between 1PM - 3PM on 05-12-19 TUESDAY   Remember: Instructions that are not followed completely may result in serious medical risk, up to and including death, or upon the discretion of your surgeon and anesthesiologist your surgery may need to be rescheduled.    _x___ 1. Do not eat food after midnight the night before your procedure. NO GUM OR CANDY AFTER MIDNIGHT. You may drink clear liquids up to 2 hours before you are scheduled to arrive at the hospital for your procedure.  Do not drink clear liquids within 2 hours of your scheduled arrival to the hospital.  Clear liquids include  --Water or Apple juice without pulp  --Gatorade  --Black Coffee or Clear Tea (No milk, no creamers, do not add anything to the coffee or Tea)   ____Ensure clear carbohydrate drink on the way to the hospital for bariatric patients  _X___Ensure clear carbohydrate drink 3 hours before surgery.     __x__ 2. No Alcohol for 24 hours before or after surgery.   __x__3. No Smoking or e-cigarettes for 24 prior to surgery.  Do not use any chewable tobacco products for at least 6 hour prior to surgery   ____  4. Bring all medications with you on the day of surgery if instructed.    __x__ 5. Notify your doctor if there is any change in your medical condition     (cold, fever, infections).    x___6. On the morning of surgery brush your teeth with toothpaste and water.  You may rinse your mouth with mouth wash if you wish.  Do not swallow any toothpaste or mouthwash.   Do not wear jewelry, make-up, hairpins, clips or nail polish.  Do not wear lotions, powders, or perfumes.   Do not shave 48 hours prior to surgery. Men may shave face and neck.  Do not bring  valuables to the hospital.    [facility] is not responsible for any belongings or valuables.               Contacts, dentures or bridgework may not be worn into surgery.  Leave your suitcase in the car. After surgery it may be brought to your room.  For patients admitted to the hospital, discharge time is determined by your  treatment team.  _  Patients discharged the day of surgery will not be allowed to drive home.  You will need someone to drive you home and stay with you the night of your procedure.    Please read over the following fact sheets that you were given:   [facility] Preparing for Surgery and or MRSA Information   _x___ TAKE THE FOLLOWING MEDICATION THE MORNING OF SURGERY WITH A SMALL SIP OF WATER. These include:  1. PROTONIX (PANTOPRAZOLE)  2. TAKE AN EXTRA PROTONIX THE NIGHT BEFORE YOUR SURGERY  3.  4.  5.  6.  ____Fleets enema or Magnesium Citrate as directed.   _x___ Use CHG Soap or sage wipes as directed on instruction sheet   ____ Use inhalers on the day of surgery and bring to hospital day of surgery  ____ Stop Metformin and Janumet 2 days prior to surgery.    ____ Take 1/2 of usual insulin dose the night before surgery and  none on the morning surgery.   _x___ Follow recommendations from Cardiologist, Pulmonologist or PCP regarding stopping Aspirin, Coumadin, Plavix ,Eliquis, Effient, or Pradaxa, and Pletal-INSTRUCTED BY SURGEONS OFFICE TO STOP ASPIRIN 1 WEEK PRIOR TO SURGERY  X____Stop Anti-inflammatories such as Advil, Aleve, Ibuprofen, Motrin, Naproxen,MELOXICAM (MOBIC) Naprosyn, Goodies powders or aspirin products 1 WEEK PRIOR TO SURGERY-OK to take Tylenol    _x___ Stop supplements until after surgery-STOP FISH OIL, VITAMIN E AND PHENTERMINE 1 WEEK PRIOR TO SURGERY-MAY RESUME AFTER SURGERY   ____ Bring C-Pap to the hospital.

## 2019-04-29 NOTE — Pre-Procedure Instructions (Signed)
Sindy Guadeloupe, MD  Physician  Oncology  Progress Notes  Signed  Encounter Date:  03/16/2019          Signed         Show:Clear all '[x]' Manual'[x]' Template'[x]' Copied  Added by: '[x]' Sindy Guadeloupe, MD  '[]' Hover for details     Hematology/Oncology Consult note [facility] [phone] [phone]  Patient Care Team: Einar Pheasant, MD as PCP - General (Internal Medicine)   Name of the patient: Caitlin Bennett  [id number]  1945-08-27   Date of visit: 03/16/19  Diagnosis-thrombocytosis of unclear etiology  Chief complaint/ Reason for visit-discuss results of blood work  Heme/Onc history: Patient is a 73 year old female with a past medical history significant for hypertension, hyperlipidemia referred to [place] for thrombocytosis.Looking back at her CBC over the last 4 years patient has had consistentmild thrombocytosis and her platelet counts have been mostly between 122 430. More recently in September 2019 her platelet count was 451 followed by 498 in January 2020 and 507 in August 2020. She has had a normal white count with a normal differential and a normal hemoglobin as well. No personal history of DVT PE heart attacks or strokes. Overall she feels well and her appetite and weight have been stable. Denies any fatigue or unintentional weight loss or drenching night sweats. Denies any abdominal pain. He does have ongoing back and knee pain for which she sees orthopedics. He has a surgery for her knee is in October 2020.  Jak 2 mutation testing as well as CAL R and MPL mutation testing was negative.  BCR able gene rearrangement was negative.  CMP was normal.  Ferritin and iron studies were normal.  ESR was normal.  Results of blood work from 02/27/2019 were as follows: CBC showed white count of 6.3 with a normal differential, H&H of 14.9/44.4 and a platelet count of 481.  Interval history-no new complaints since her last visit.   She does have chronic knee pain and will be undergoing Knee surgery in October 2020.  ECOG PS- 1 Pain scale- 0   Review of systems- Review of Systems  Constitutional: Negative for chills, fever, malaise/fatigue and weight loss.  HENT: Negative for congestion, ear discharge and nosebleeds.   Eyes: Negative for blurred vision.  Respiratory: Negative for cough, hemoptysis, sputum production, shortness of breath and wheezing.   Cardiovascular: Negative for chest pain, palpitations, orthopnea and claudication.  Gastrointestinal: Negative for abdominal pain, blood in stool, constipation, diarrhea, heartburn, melena, nausea and vomiting.  Genitourinary: Negative for dysuria, flank pain, frequency, hematuria and urgency.  Musculoskeletal: Positive for back pain and joint pain. Negative for myalgias.  Skin: Negative for rash.  Neurological: Negative for dizziness, tingling, focal weakness, seizures, weakness and headaches.  Endo/Heme/Allergies: Does not bruise/bleed easily.  Psychiatric/Behavioral: Negative for depression and suicidal ideas. The patient does not have insomnia.           Allergies  Allergen Reactions  . Sumycin [Tetracycline Hcl] Rash         Past Medical History:  Diagnosis Date  . Blood in the stool    10 yrs ago  . Diverticulitis   . History of abnormal Pap smear   . History of chicken pox   . History of colon polyps   . Hypercholesterolemia   . Hypertension   . Seronegative rheumatoid arthritis [facility])           Past Surgical History:  Procedure Laterality Date  . DILATION AND CURETTAGE OF UTERUS    .  LEG / ANKLE SOFT TISSUE BIOPSY  2011   to confirm arthritis    Social History        Socioeconomic History  . Marital status: Divorced    Spouse name: Not on file  . Number of children: Not on file  . Years of education: Not on file  . Highest education level: Not on file  Occupational History  . Not on file  Social  Needs  . Financial resource strain: Not on file  . Food insecurity    Worry: Not on file    Inability: Not on file  . Transportation needs    Medical: Not on file    Non-medical: Not on file  Tobacco Use  . Smoking status: Never Smoker  . Smokeless tobacco: Never Used  Substance and Sexual Activity  . Alcohol use: No    Alcohol/week: 0.0 standard drinks  . Drug use: No  . Sexual activity: Not on file  Lifestyle  . Physical activity    Days per week: Not on file    Minutes per session: Not on file  . Stress: Not on file  Relationships  . Social Herbalist on phone: Not on file    Gets together: Not on file    Attends religious service: Not on file    Active member of club or organization: Not on file    Attends meetings of clubs or organizations: Not on file    Relationship status: Not on file  . Intimate partner violence    Fear of current or ex partner: Not on file    Emotionally abused: Not on file    Physically abused: Not on file    Forced sexual activity: Not on file  Other Topics Concern  . Not on file  Social History Narrative  . Not on file         Family History  Problem Relation Age of Onset  . Arthritis Father   . Hyperlipidemia Father   . Heart disease Father   . Hypertension Father   . Diabetes Father   . Pulmonary fibrosis Father   . Arthritis Mother   . Heart disease Mother   . Hyperlipidemia Mother   . Hypertension Mother   . Colon cancer Maternal Aunt   . Colon cancer Maternal Uncle   . Breast cancer Neg Hx      Current Outpatient Medications:  .  aspirin 81 MG chewable tablet, Chew 81 mg by mouth daily., Disp: , Rfl:  .  calcium elemental as carbonate (TUMS ULTRA 1000) 400 MG tablet, Chew 1,000 mg by mouth daily.  , Disp: , Rfl:  .  Doxylamine Succinate, Sleep, (SLEEP AID PO), OTC as directed. , Disp: , Rfl:  .  lisinopril (ZESTRIL) 10 MG tablet, TAKE 1 TABLET DAILY, Disp: 90  tablet, Rfl: 1 .  meloxicam (MOBIC) 15 MG tablet, TAKE 1 TABLET DAILY, Disp: 90 tablet, Rfl: 0 .  Multiple Vitamins-Minerals (CENTRUM ULTRA WOMENS PO), Take by mouth., Disp: , Rfl:  .  Omega-3 Fatty Acids (RA FISH OIL) 1000 MG CAPS, Take by mouth., Disp: , Rfl:  .  pantoprazole (PROTONIX) 40 MG tablet, TAKE 1 TABLET DAILY, Disp: 90 tablet, Rfl: 1 .  simvastatin (ZOCOR) 20 MG tablet, TAKE 1 TABLET EVERY EVENING, Disp: 90 tablet, Rfl: 1 .  traMADol (ULTRAM) 50 MG tablet, 1-2 po bid prn, Disp: , Rfl:  .  vitamin E (VITAMIN E) 400 UNIT capsule, Take 400 Units by  mouth daily., Disp: , Rfl:   Physical exam:     Vitals:   03/16/19 1317  BP: (!) 130/92  Pulse: 74  Temp: 97.8 F (36.6 C)  TempSrc: Tympanic  Weight: 163 lb (73.9 kg)  Height: '5\' 1"'  (1.549 m)   Physical Exam Constitutional:      General: She is not in acute distress. HENT:     Head: Normocephalic and atraumatic.  Eyes:     Pupils: Pupils are equal, round, and reactive to light.  Neck:     Musculoskeletal: Normal range of motion.  Cardiovascular:     Rate and Rhythm: Normal rate and regular rhythm.     Heart sounds: Normal heart sounds.  Pulmonary:     Effort: Pulmonary effort is normal.     Breath sounds: Normal breath sounds.  Abdominal:     General: Bowel sounds are normal.     Palpations: Abdomen is soft.  Skin:    General: Skin is warm and dry.  Neurological:     Mental Status: She is alert and oriented to person, place, and time.      CMP Latest Ref Rng & Units 02/27/2019  Glucose 70 - 99 mg/dL 151(H)  BUN 8 - 23 mg/dL 26(H)  Creatinine 0.44 - 1.00 mg/dL 0.60  Sodium 135 - 145 mmol/L 138  Potassium 3.5 - 5.1 mmol/L 3.7  Chloride 98 - 111 mmol/L 104  CO2 22 - 32 mmol/L 25  Calcium 8.9 - 10.3 mg/dL 9.3  Total Protein 6.5 - 8.1 g/dL 7.3  Total Bilirubin 0.3 - 1.2 mg/dL 0.6  Alkaline Phos 38 - 126 U/L 68  AST 15 - 41 U/L 22  ALT 0 - 44 U/L 21   CBC Latest Ref Rng & Units 02/27/2019  WBC 4.0 - 10.5  K/uL 6.3  Hemoglobin 12.0 - 15.0 g/dL 14.9  Hematocrit 36.0 - 46.0 % 44.4  Platelets 150 - 400 K/uL 481(H)      Assessment and plan- Patient is a 73 y.o. female referred for thrombocytosis of unclear etiology  I discussed the results of the blood work with the patient in detail.  Patient has had mild thrombocytosis and a platelet count fluctuates between 4 30-500 over the last 2 years.  During her last visit her platelet count was 481 but prior to that her platelet count was 507 when checked at Dr. Bary Leriche office.  Overall her platelet counts have remained stable in the high 400s and have not shown a consistent rising trend.  No personal history of any thrombosis.  Her Jak 2, CAL R and MPL mutation testing was negative.  BCR ABL gene rearrangement was also negative.  Suspicion for primary myeloproliferative disorder is low.  I will consider a bone marrow to rule out a myeloproliferative disorder if there is a consistent rise in her platelet counts.  Patient is currently on low-dose aspirin which she will continue.  As such there is no contraindication for her to proceed with her knee surgery from a hematologic standpoint in October 2020.  I will see her back in 4 months with a CBC with differential.  She can get another CBC with differential checked right before her knee surgery   Visit Diagnosis 1. Thrombocytosis ([place])      Dr. Randa Evens, MD, MPH [facility] [phone] 03/16/2019 1:37 PM                       Electronically  signed by Sindy Guadeloupe, MD at 03/16/2019 1:43 PM   Office Visit on 03/16/2019     Detailed Report

## 2019-04-30 LAB — URINE CULTURE
Culture: NO GROWTH
Special Requests: NORMAL

## 2019-05-08 ENCOUNTER — Other Ambulatory Visit
Admission: RE | Admit: 2019-05-08 | Discharge: 2019-05-08 | Disposition: A | Discharge status: Home / Self Care | Payer: Medicare HMO | Source: Ambulatory Visit | Attending: Orthopedic Surgery | Admitting: Orthopedic Surgery

## 2019-05-08 ENCOUNTER — Other Ambulatory Visit: Payer: Self-pay

## 2019-05-08 DIAGNOSIS — Z20828 Contact with and (suspected) exposure to other viral communicable diseases: Secondary | ICD-10-CM | POA: Insufficient documentation

## 2019-05-08 DIAGNOSIS — Z01812 Encounter for preprocedural laboratory examination: Secondary | ICD-10-CM | POA: Insufficient documentation

## 2019-05-08 LAB — SARS CORONAVIRUS 2 (TAT 6-24 HRS): SARS Coronavirus 2: NEGATIVE

## 2019-05-11 ENCOUNTER — Encounter: Payer: Self-pay | Admitting: *Deleted

## 2019-05-11 ENCOUNTER — Telehealth: Payer: Self-pay

## 2019-05-11 NOTE — Telephone Encounter (Signed)
Received fax from preadmit testing in regards to patient having knee surgery on 05/13/19. Faxed back a letter from Dr Janese Banks letting them know that it was okay to proceed from a hematology stand point and that Dr Janese Banks recommends that she be on anti coagulants post op for 14 days.

## 2019-05-11 NOTE — Telephone Encounter (Signed)
Sent a my chart message to see if it is ok to move pt appt up from dec to November due to plt increased some from last one done in clinic to the one done for preop for surgery. Waiting for my chart message from pt

## 2019-05-12 ENCOUNTER — Encounter: Payer: Self-pay | Admitting: Orthopedic Surgery

## 2019-05-12 ENCOUNTER — Other Ambulatory Visit: Payer: Self-pay | Admitting: *Deleted

## 2019-05-12 DIAGNOSIS — D473 Essential (hemorrhagic) thrombocythemia: Secondary | ICD-10-CM

## 2019-05-12 DIAGNOSIS — D75839 Thrombocytosis, unspecified: Secondary | ICD-10-CM

## 2019-05-12 NOTE — H&P (Signed)
ORTHOPAEDIC HISTORY AND PHYSICAL  [place] [facility] Chief Complaint:   Chief Complaint  Patient presents with  . Knee Pain  H & P RIGHT KNEE   History of Present Illness:   Caitlin Bennett is a 73 y.o. female that presents to clinic today for her preoperative history and evaluation. Patient presents unaccompanied. The patient is scheduled to undergo a right total knee arthroplasty on 05/13/19 by Dr. Marry Guan. Her pain began several years ago. The pain is located primarily along the lateral aspect of the knee. She denies associated numbness or tingling.   The patient's symptoms have progressed to the point that they decrease her quality of life. The patient has previously undergone conservative treatment including NSAIDS and injections to the knee without adequate control of her symptoms. Patient states that she is also having some left knee pain, and states she would like an injection.   Patient denies any history of blood clots or cardiac issues.  Past Medical History:  . Chicken pox  . DDD (degenerative disc disease)  . Inflammatory arthritis  seronegative, methotrexate  . Osteoarthritis  . Spondylosis   Past Surgical History:  . WISDOM TEETH   Current Medications:  . acetaminophen/diphenhydramine (ACETAMINOPHEN PM ORAL) Take 1 tablet by mouth nightly as needed  . aspirin 81 MG chewable tablet Take 81 mg by mouth once daily  . calcium carbonate (TUMS ULTRA) 400 mg calcium (1,000 mg) chewable tablet Take 1,000 mg by mouth once daily.   Marland Kitchen docosahexanoic acid-epa (FISH OIL) 120-180 mg Cap Take 1 capsule by mouth once daily  . Lactobacillus acidophilus (PROBIOTIC ACIDOPHILUS ORAL) Take 1 capsule by mouth once daily  . lisinopril (PRINIVIL,ZESTRIL) 10 MG tablet Take 10 mg by mouth once daily.   . meloxicam (MOBIC) 15 MG tablet TAKE 1 TABLET BY MOUTH ONCE DAILY (Patient taking differently: Take 15 mg by mouth every other day ) 90 tablet 1  .  multivitamin with iron-minerals (SUPER THERA VITE M) tablet Take 1 tablet by mouth once daily.   . pantoprazole (PROTONIX) 40 MG DR tablet Take 40 mg by mouth once daily.   . phentermine (ADIPEX-P) 30 MG capsule Take 30 mg by mouth every morning before breakfast  . simvastatin (ZOCOR) 20 MG tablet TAKE 1 TABLET BY MOUTH EVERY EVENING  . traMADoL (ULTRAM) 50 mg tablet 1-2 po bid prn 360 tablet 0   Allergies:  . Somnicin [Melaton-5-Hydroxy-Trypt-B6-Mag] Rash   Social History:  Socioeconomic History  . Marital status: Divorced  Spouse name: Not on file  . Number of children: 1  . Years of education: 64  . Highest education level: Not on file  Occupational History  . Occupation: Retired- Psychologist, educational  . Financial resource strain: Not on file  . Food insecurity  Worry: Not on file  Inability: Not on file  . Transportation needs  Medical: Not on file  Non-medical: Not on file  Tobacco Use  . Smoking status: Never Smoker  . Smokeless tobacco: Never Used  Substance and Sexual Activity  . Alcohol use: No  Alcohol/week: 0.0 standard drinks  Comment: Socially only  . Drug use: No  . Sexual activity: Defer  Partners: Male  Lifestyle  . Physical activity  Days per week: Not on file  Minutes per session: Not on file  . Stress: Not on file  Relationships  . Social Medical illustrator on phone: Not on file  Gets together: Not on file  Attends religious  service: Not on file  Active member of club or organization: Not on file  Attends meetings of clubs or organizations: Not on file  Relationship status: Not on file  Other Topics Concern  . Not on file  Social History Narrative  Ms. Helper is divorced and retired. She denies tobacco use and drinks alcohol socially.   Family History:  . High blood pressure (Hypertension) Mother  . Heart disease Mother  . COPD Mother  . Emphysema Mother  . Heart disease Father  . Diabetes type II Father   Review of Systems:   A 10+ ROS was performed, reviewed, and the pertinent orthopaedic findings are documented in the HPI.   Physical Examination:  BP 124/80  Ht 157.5 cm (5\' 2" )  Wt 73.2 kg (161 lb 6.4 oz)  BMI 29.52 kg/m   Patient is a well-developed, well-nourished female in no acute distress. Patient has normal mood and affect. Patient is alert and oriented to person, place, and time. Pupils are equal and round with synchronous movement. No injected sclera.   Cardiovascular: Regular rate and rhythm, with no murmurs, rubs, or gallops. Distal pulses palpable.  Respiratory: Lungs clear to auscultation bilaterally.   Right Knee: Soft tissue swelling:mild Effusion:none Erythema:none Crepitance:mild Tenderness:lateral Alignment:relative valgus Mediolateral laxity:lateral pseudolaxity Posterior BK:[id number] Patellar tracking:Good tracking without evidence of subluxation or tilt Atrophy:No significantatrophy.  Quadriceps tone was fair to good. Range of motion:0/0/132degrees  Valgus deformity of the knee noted.  Sensation intact over the lateral sural cutaneous, saphenous, superficial fibular, and deep fibular nerve distributions.  X-rays 3 views of the right knee were obtained. Images reveal severe loss of joint space in the lateral compartment with significant osteophyte formation. Sclerosis of the bone is noted in the lateral compartment. Medial compartment reveals mild loss of joint space with mild osteophyte formation noted. Patellofemoral joint reveals well-preserved joint space with minimal osteophyte formation.  Impression:  Primary osteoarthritis of right knee M17.11   Plan:  The patient has end-stage degenerative changes of the right knee. It was explained to the patient that the condition is  progressive in nature. Having failed conservative treatment, the patient has elected to proceed with a total joint arthroplasty. The patient will undergo a total joint arthroplasty with Dr. Marry Guan. The risks of surgery, including blood clot and infection, were discussed with the patient. Measures to reduce these risks, including the use of anticoagulation, perioperative antibiotics, and early ambulation were discussed. The importance of postoperative physical therapy was discussed with the patient. The patient elects to proceed with surgery. The patient is instructed to stop all blood thinners prior to surgery. The patient is instructed to call the hospital the day before surgery to learn of the proper arrival time.   I will administer a corticosteroid injection to the left knee if patient's knee continues to bother her at her first postoperative appointment.  Patient is to follow-up in the office 2 weeks postoperatively.  Gwenlyn Fudge, PA [facility] [address], [place] [address] Phone: [phone]  This note was generated in part with voice recognition software and I apologize for any typographical errors that were not detected and corrected.  Electronically signed by Gwenlyn Fudge, PA at 04/28/2019 12:20 PM EDT    Review of Systems  Constitutional: Positive for activity change.  HENT: Negative.  Eyes: Negative.  Respiratory: Negative.  Cardiovascular: Negative.  Gastrointestinal: Negative.  Endocrine: Negative.  Genitourinary: Negative.  Musculoskeletal: Positive for arthralgias, gait problem and myalgias.  Skin: Negative.  Allergic/Immunologic: Negative.  Hematological: Bruises/bleeds easily.  Psychiatric/Behavioral: Negative.    Electronically signed by Rush Landmark, [place] at 04/28/2019 12:20 PM EDT

## 2019-05-13 ENCOUNTER — Inpatient Hospital Stay
Admission: RE | Admit: 2019-05-13 | Discharge: 2019-05-15 | DRG: 470 | Disposition: A | Discharge status: Home Health | Payer: Medicare HMO | Attending: Orthopedic Surgery | Admitting: Orthopedic Surgery

## 2019-05-13 ENCOUNTER — Encounter
Admission: RE | Disposition: A | Discharge status: Home Health | Payer: Self-pay | Source: Home / Self Care | Attending: Orthopedic Surgery

## 2019-05-13 ENCOUNTER — Inpatient Hospital Stay: Payer: Medicare HMO

## 2019-05-13 ENCOUNTER — Inpatient Hospital Stay: Payer: Medicare HMO | Admitting: Certified Registered"

## 2019-05-13 ENCOUNTER — Encounter: Payer: Self-pay | Admitting: *Deleted

## 2019-05-13 ENCOUNTER — Other Ambulatory Visit: Payer: Self-pay

## 2019-05-13 DIAGNOSIS — M0600 Rheumatoid arthritis without rheumatoid factor, unspecified site: Secondary | ICD-10-CM | POA: Diagnosis not present

## 2019-05-13 DIAGNOSIS — Z888 Allergy status to other drugs, medicaments and biological substances status: Secondary | ICD-10-CM | POA: Diagnosis not present

## 2019-05-13 DIAGNOSIS — K219 Gastro-esophageal reflux disease without esophagitis: Secondary | ICD-10-CM | POA: Diagnosis present

## 2019-05-13 DIAGNOSIS — M47817 Spondylosis without myelopathy or radiculopathy, lumbosacral region: Secondary | ICD-10-CM | POA: Diagnosis not present

## 2019-05-13 DIAGNOSIS — M170 Bilateral primary osteoarthritis of knee: Secondary | ICD-10-CM | POA: Diagnosis not present

## 2019-05-13 DIAGNOSIS — Z471 Aftercare following joint replacement surgery: Secondary | ICD-10-CM | POA: Diagnosis not present

## 2019-05-13 DIAGNOSIS — E785 Hyperlipidemia, unspecified: Secondary | ICD-10-CM | POA: Diagnosis present

## 2019-05-13 DIAGNOSIS — Z96659 Presence of unspecified artificial knee joint: Secondary | ICD-10-CM

## 2019-05-13 DIAGNOSIS — Z7982 Long term (current) use of aspirin: Secondary | ICD-10-CM

## 2019-05-13 DIAGNOSIS — Z79899 Other long term (current) drug therapy: Secondary | ICD-10-CM

## 2019-05-13 DIAGNOSIS — Z791 Long term (current) use of non-steroidal anti-inflammatories (NSAID): Secondary | ICD-10-CM

## 2019-05-13 DIAGNOSIS — I10 Essential (primary) hypertension: Secondary | ICD-10-CM | POA: Diagnosis present

## 2019-05-13 DIAGNOSIS — M5137 Other intervertebral disc degeneration, lumbosacral region: Secondary | ICD-10-CM | POA: Diagnosis present

## 2019-05-13 DIAGNOSIS — Z79891 Long term (current) use of opiate analgesic: Secondary | ICD-10-CM | POA: Diagnosis not present

## 2019-05-13 DIAGNOSIS — M1711 Unilateral primary osteoarthritis, right knee: Secondary | ICD-10-CM | POA: Diagnosis not present

## 2019-05-13 DIAGNOSIS — M25761 Osteophyte, right knee: Secondary | ICD-10-CM | POA: Diagnosis not present

## 2019-05-13 DIAGNOSIS — Z96651 Presence of right artificial knee joint: Secondary | ICD-10-CM | POA: Diagnosis not present

## 2019-05-13 DIAGNOSIS — E7800 Pure hypercholesterolemia, unspecified: Secondary | ICD-10-CM | POA: Diagnosis not present

## 2019-05-13 HISTORY — PX: KNEE ARTHROPLASTY: SHX992

## 2019-05-13 LAB — ABO/RH: ABO/RH(D): A POS

## 2019-05-13 SURGERY — ARTHROPLASTY, KNEE, TOTAL, USING IMAGELESS COMPUTER-ASSISTED NAVIGATION
Anesthesia: Spinal | Site: Knee | Laterality: Right

## 2019-05-13 MED ORDER — METOCLOPRAMIDE HCL 10 MG PO TABS: 5.0000 mg | ORAL_TABLET | Freq: Three times a day (TID) | ORAL | Status: DC | PRN

## 2019-05-13 MED ORDER — ENOXAPARIN SODIUM 30 MG/0.3ML ~~LOC~~ SOLN
30.0000 mg | Freq: Two times a day (BID) | SUBCUTANEOUS | Status: DC
  Administered 2019-05-14 – 2019-05-15 (×3): 30 mg via SUBCUTANEOUS
  Filled 2019-05-13 (×4): qty 0.3

## 2019-05-13 MED ORDER — DIPHENHYDRAMINE HCL 12.5 MG/5ML PO ELIX: 12.5000 mg | ORAL_SOLUTION | ORAL | Status: DC | PRN

## 2019-05-13 MED ORDER — ACETAMINOPHEN 10 MG/ML IV SOLN
1000.0000 mg | Freq: Four times a day (QID) | INTRAVENOUS | Status: AC
  Administered 2019-05-13 – 2019-05-14 (×4): 1000 mg via INTRAVENOUS
  Filled 2019-05-13 (×4): qty 100

## 2019-05-13 MED ORDER — METOCLOPRAMIDE HCL 5 MG/ML IJ SOLN: 5.0000 mg | Freq: Three times a day (TID) | INTRAMUSCULAR | Status: DC | PRN

## 2019-05-13 MED ORDER — MIDAZOLAM HCL 2 MG/2ML IJ SOLN
INTRAMUSCULAR | Status: AC
  Filled 2019-05-13: qty 2

## 2019-05-13 MED ORDER — ENSURE PRE-SURGERY PO LIQD
296.0000 mL | Freq: Once | ORAL | Status: DC
  Filled 2019-05-13: qty 296

## 2019-05-13 MED ORDER — FLEET ENEMA 7-19 GM/118ML RE ENEM: 1.0000 | ENEMA | Freq: Once | RECTAL | Status: DC | PRN

## 2019-05-13 MED ORDER — TRANEXAMIC ACID-NACL 1000-0.7 MG/100ML-% IV SOLN
1000.0000 mg | Freq: Once | INTRAVENOUS | Status: AC
  Administered 2019-05-13: 11:00:00 1000 mg via INTRAVENOUS
  Filled 2019-05-13: qty 100

## 2019-05-13 MED ORDER — GABAPENTIN 300 MG PO CAPS: 300.0000 mg | ORAL_CAPSULE | Freq: Once | ORAL | Status: DC

## 2019-05-13 MED ORDER — METOCLOPRAMIDE HCL 10 MG PO TABS
10.0000 mg | ORAL_TABLET | Freq: Three times a day (TID) | ORAL | Status: DC
  Administered 2019-05-13 – 2019-05-15 (×7): 10 mg via ORAL
  Filled 2019-05-13 (×8): qty 1

## 2019-05-13 MED ORDER — SODIUM CHLORIDE 0.9 % IV SOLN
INTRAVENOUS | Status: DC
  Administered 2019-05-13 – 2019-05-14 (×2): via INTRAVENOUS

## 2019-05-13 MED ORDER — LISINOPRIL 10 MG PO TABS
10.0000 mg | ORAL_TABLET | Freq: Every morning | ORAL | Status: DC
  Administered 2019-05-15: 10:00:00 10 mg via ORAL
  Filled 2019-05-13: qty 1

## 2019-05-13 MED ORDER — FENTANYL CITRATE (PF) 100 MCG/2ML IJ SOLN
INTRAMUSCULAR | Status: AC
  Filled 2019-05-13: qty 2

## 2019-05-13 MED ORDER — OXYCODONE HCL 5 MG PO TABS
5.0000 mg | ORAL_TABLET | ORAL | Status: DC | PRN
  Administered 2019-05-13 – 2019-05-15 (×4): 5 mg via ORAL
  Filled 2019-05-13 (×3): qty 1

## 2019-05-13 MED ORDER — PROPOFOL 10 MG/ML IV BOLUS
INTRAVENOUS | Status: AC
  Filled 2019-05-13: qty 20

## 2019-05-13 MED ORDER — CELECOXIB 200 MG PO CAPS
400.0000 mg | ORAL_CAPSULE | Freq: Once | ORAL | Status: AC
  Administered 2019-05-13: 07:00:00 400 mg via ORAL

## 2019-05-13 MED ORDER — ONDANSETRON HCL 4 MG/2ML IJ SOLN: 4.0000 mg | Freq: Four times a day (QID) | INTRAMUSCULAR | Status: DC | PRN

## 2019-05-13 MED ORDER — TRAMADOL HCL 50 MG PO TABS
50.0000 mg | ORAL_TABLET | ORAL | Status: DC | PRN
  Administered 2019-05-13: 15:00:00 100 mg via ORAL
  Administered 2019-05-13: 20:00:00 50 mg via ORAL
  Administered 2019-05-14 (×2): 100 mg via ORAL
  Filled 2019-05-13: qty 2
  Filled 2019-05-13 (×3): qty 1
  Filled 2019-05-13: qty 2

## 2019-05-13 MED ORDER — CALCIUM CARBONATE ANTACID 500 MG PO CHEW
1.5000 | CHEWABLE_TABLET | Freq: Every day | ORAL | Status: DC
  Administered 2019-05-14 – 2019-05-15 (×2): 300 mg via ORAL
  Filled 2019-05-13 (×2): qty 2

## 2019-05-13 MED ORDER — NEOMYCIN-POLYMYXIN B GU 40-200000 IR SOLN
Status: DC | PRN
  Administered 2019-05-13: 14 mL

## 2019-05-13 MED ORDER — ADULT MULTIVITAMIN W/MINERALS CH
1.0000 | ORAL_TABLET | Freq: Every day | ORAL | Status: DC
  Administered 2019-05-14 – 2019-05-15 (×2): 1 via ORAL
  Filled 2019-05-13 (×2): qty 1

## 2019-05-13 MED ORDER — BUPIVACAINE HCL (PF) 0.25 % IJ SOLN
INTRAMUSCULAR | Status: DC | PRN
  Administered 2019-05-13: 60 mL

## 2019-05-13 MED ORDER — CHLORHEXIDINE GLUCONATE 4 % EX LIQD: 60.0000 mL | Freq: Once | CUTANEOUS | Status: DC

## 2019-05-13 MED ORDER — FERROUS SULFATE 325 (65 FE) MG PO TABS
325.0000 mg | ORAL_TABLET | Freq: Two times a day (BID) | ORAL | Status: DC
  Administered 2019-05-13 – 2019-05-15 (×4): 325 mg via ORAL
  Filled 2019-05-13 (×4): qty 1

## 2019-05-13 MED ORDER — PROPOFOL 500 MG/50ML IV EMUL
INTRAVENOUS | Status: DC | PRN
  Administered 2019-05-13: 75 ug/kg/min via INTRAVENOUS

## 2019-05-13 MED ORDER — PROPOFOL 500 MG/50ML IV EMUL
INTRAVENOUS | Status: AC
  Filled 2019-05-13: qty 50

## 2019-05-13 MED ORDER — PHENYLEPHRINE HCL (PRESSORS) 10 MG/ML IV SOLN
INTRAVENOUS | Status: DC | PRN
  Administered 2019-05-13: 100 ug via INTRAVENOUS
  Administered 2019-05-13: 200 ug via INTRAVENOUS
  Administered 2019-05-13: 100 ug via INTRAVENOUS

## 2019-05-13 MED ORDER — PHENOL 1.4 % MT LIQD: 1.0000 | OROMUCOSAL | Status: DC | PRN

## 2019-05-13 MED ORDER — SIMVASTATIN 20 MG PO TABS
20.0000 mg | ORAL_TABLET | Freq: Every evening | ORAL | Status: DC
  Administered 2019-05-13 – 2019-05-14 (×2): 20 mg via ORAL
  Filled 2019-05-13 (×2): qty 1

## 2019-05-13 MED ORDER — DEXAMETHASONE SODIUM PHOSPHATE 10 MG/ML IJ SOLN
8.0000 mg | Freq: Once | INTRAMUSCULAR | Status: AC
  Administered 2019-05-13: 07:00:00 8 mg via INTRAVENOUS

## 2019-05-13 MED ORDER — ONDANSETRON HCL 4 MG/2ML IJ SOLN: 4.0000 mg | Freq: Once | INTRAMUSCULAR | Status: DC | PRN

## 2019-05-13 MED ORDER — PANTOPRAZOLE SODIUM 40 MG PO TBEC
40.0000 mg | DELAYED_RELEASE_TABLET | Freq: Two times a day (BID) | ORAL | Status: DC
  Administered 2019-05-13 – 2019-05-15 (×4): 40 mg via ORAL
  Filled 2019-05-13 (×4): qty 1

## 2019-05-13 MED ORDER — SODIUM CHLORIDE 0.9 % IV SOLN
INTRAVENOUS | Status: DC | PRN
  Administered 2019-05-13: 40 ug/min via INTRAVENOUS

## 2019-05-13 MED ORDER — GLYCOPYRROLATE 0.2 MG/ML IJ SOLN
INTRAMUSCULAR | Status: DC | PRN
  Administered 2019-05-13: 0.2 mg via INTRAVENOUS

## 2019-05-13 MED ORDER — TRANEXAMIC ACID-NACL 1000-0.7 MG/100ML-% IV SOLN
1000.0000 mg | INTRAVENOUS | Status: AC
  Administered 2019-05-13: 1000 mg via INTRAVENOUS
  Filled 2019-05-13: qty 100

## 2019-05-13 MED ORDER — BISACODYL 10 MG RE SUPP: 10.0000 mg | Freq: Every day | RECTAL | Status: DC | PRN

## 2019-05-13 MED ORDER — BUPIVACAINE HCL (PF) 0.5 % IJ SOLN
INTRAMUSCULAR | Status: DC | PRN
  Administered 2019-05-13: 3 mL

## 2019-05-13 MED ORDER — VITAMIN E 180 MG (400 UNIT) PO CAPS
400.0000 [IU] | ORAL_CAPSULE | Freq: Every day | ORAL | Status: DC
  Administered 2019-05-14 – 2019-05-15 (×2): 400 [IU] via ORAL
  Filled 2019-05-13 (×3): qty 1

## 2019-05-13 MED ORDER — CEFAZOLIN SODIUM-DEXTROSE 2-4 GM/100ML-% IV SOLN
2.0000 g | Freq: Four times a day (QID) | INTRAVENOUS | Status: AC
  Administered 2019-05-13 – 2019-05-14 (×4): 2 g via INTRAVENOUS
  Filled 2019-05-13 (×4): qty 100

## 2019-05-13 MED ORDER — FENTANYL CITRATE (PF) 100 MCG/2ML IJ SOLN: 25.0000 ug | INTRAMUSCULAR | Status: DC | PRN

## 2019-05-13 MED ORDER — ACETAMINOPHEN 10 MG/ML IV SOLN
INTRAVENOUS | Status: DC | PRN
  Administered 2019-05-13: 1000 mg via INTRAVENOUS

## 2019-05-13 MED ORDER — BUPIVACAINE HCL (PF) 0.5 % IJ SOLN
INTRAMUSCULAR | Status: AC
  Filled 2019-05-13: qty 10

## 2019-05-13 MED ORDER — GABAPENTIN 300 MG PO CAPS
300.0000 mg | ORAL_CAPSULE | Freq: Every day | ORAL | Status: DC
  Administered 2019-05-13 – 2019-05-14 (×2): 300 mg via ORAL
  Filled 2019-05-13 (×2): qty 1

## 2019-05-13 MED ORDER — DEXAMETHASONE SODIUM PHOSPHATE 10 MG/ML IJ SOLN
INTRAMUSCULAR | Status: AC
  Administered 2019-05-13: 8 mg via INTRAVENOUS
  Filled 2019-05-13: qty 1

## 2019-05-13 MED ORDER — MIDAZOLAM HCL 5 MG/5ML IJ SOLN
INTRAMUSCULAR | Status: DC | PRN
  Administered 2019-05-13 (×2): 1 mg via INTRAVENOUS

## 2019-05-13 MED ORDER — CEFAZOLIN SODIUM-DEXTROSE 2-4 GM/100ML-% IV SOLN
2.0000 g | INTRAVENOUS | Status: AC
  Administered 2019-05-13: 2 g via INTRAVENOUS

## 2019-05-13 MED ORDER — MENTHOL 3 MG MT LOZG
1.0000 | LOZENGE | OROMUCOSAL | Status: DC | PRN
  Administered 2019-05-14: 3 mg via ORAL
  Filled 2019-05-13: qty 9

## 2019-05-13 MED ORDER — HYDROMORPHONE HCL 1 MG/ML IJ SOLN: 0.5000 mg | INTRAMUSCULAR | Status: DC | PRN

## 2019-05-13 MED ORDER — SENNOSIDES-DOCUSATE SODIUM 8.6-50 MG PO TABS
1.0000 | ORAL_TABLET | Freq: Two times a day (BID) | ORAL | Status: DC
  Administered 2019-05-13 – 2019-05-15 (×4): 1 via ORAL
  Filled 2019-05-13 (×4): qty 1

## 2019-05-13 MED ORDER — SODIUM CHLORIDE 0.9 % IV SOLN
INTRAVENOUS | Status: DC | PRN
  Administered 2019-05-13: 60 mL

## 2019-05-13 MED ORDER — LACTATED RINGERS IV SOLN
INTRAVENOUS | Status: DC
  Administered 2019-05-13 (×2): via INTRAVENOUS

## 2019-05-13 MED ORDER — ONDANSETRON HCL 4 MG PO TABS: 4.0000 mg | ORAL_TABLET | Freq: Four times a day (QID) | ORAL | Status: DC | PRN

## 2019-05-13 MED ORDER — FENTANYL CITRATE (PF) 100 MCG/2ML IJ SOLN
INTRAMUSCULAR | Status: DC | PRN
  Administered 2019-05-13 (×2): 50 ug via INTRAVENOUS

## 2019-05-13 MED ORDER — MAGNESIUM HYDROXIDE 400 MG/5ML PO SUSP
30.0000 mL | Freq: Every day | ORAL | Status: DC
  Administered 2019-05-14 – 2019-05-15 (×2): 30 mL via ORAL
  Filled 2019-05-13 (×2): qty 30

## 2019-05-13 MED ORDER — OMEGA-3-ACID ETHYL ESTERS 1 G PO CAPS
1.0000 g | ORAL_CAPSULE | Freq: Every day | ORAL | Status: DC
  Administered 2019-05-15: 1 g via ORAL
  Filled 2019-05-13 (×2): qty 1

## 2019-05-13 MED ORDER — OXYCODONE HCL 5 MG PO TABS
10.0000 mg | ORAL_TABLET | ORAL | Status: DC | PRN
  Administered 2019-05-15 (×2): 10 mg via ORAL
  Filled 2019-05-13 (×3): qty 2

## 2019-05-13 MED ORDER — RISAQUAD PO CAPS
1.0000 | ORAL_CAPSULE | Freq: Every day | ORAL | Status: DC
  Administered 2019-05-14 – 2019-05-15 (×2): 1 via ORAL
  Filled 2019-05-13 (×3): qty 1

## 2019-05-13 MED ORDER — ALUM & MAG HYDROXIDE-SIMETH 200-200-20 MG/5ML PO SUSP: 30.0000 mL | ORAL | Status: DC | PRN

## 2019-05-13 MED ORDER — CELECOXIB 200 MG PO CAPS
ORAL_CAPSULE | ORAL | Status: AC
  Administered 2019-05-13: 07:00:00 400 mg via ORAL
  Filled 2019-05-13: qty 2

## 2019-05-13 MED ORDER — CELECOXIB 200 MG PO CAPS
200.0000 mg | ORAL_CAPSULE | Freq: Two times a day (BID) | ORAL | Status: DC
  Administered 2019-05-13 – 2019-05-15 (×4): 200 mg via ORAL
  Filled 2019-05-13 (×4): qty 1

## 2019-05-13 MED ORDER — ACETAMINOPHEN 325 MG PO TABS
325.0000 mg | ORAL_TABLET | Freq: Four times a day (QID) | ORAL | Status: DC | PRN
  Administered 2019-05-15 (×2): 650 mg via ORAL
  Filled 2019-05-13 (×2): qty 2

## 2019-05-13 MED ORDER — CEFAZOLIN SODIUM-DEXTROSE 2-4 GM/100ML-% IV SOLN
INTRAVENOUS | Status: AC
  Filled 2019-05-13: qty 100

## 2019-05-13 MED ORDER — ACETAMINOPHEN 10 MG/ML IV SOLN
INTRAVENOUS | Status: AC
  Filled 2019-05-13: qty 100

## 2019-05-13 SURGICAL SUPPLY — 71 items
ATTUNE PSFEM RTSZ4 NARCEM KNEE (Femur) ×1 IMPLANT
ATTUNE PSRP INSR SZ4 5 KNEE (Insert) ×1 IMPLANT
BASEPLATE TIBIAL ROTATING SZ 4 (Knees) ×1 IMPLANT
BATTERY INSTRU NAVIGATION (MISCELLANEOUS) ×8 IMPLANT
BLADE SAW 70X12.5 (BLADE) ×2 IMPLANT
BLADE SAW 90X13X1.19 OSCILLAT (BLADE) ×2 IMPLANT
BLADE SAW 90X25X1.19 OSCILLAT (BLADE) ×2 IMPLANT
CANISTER SUCT 3000ML PPV (MISCELLANEOUS) ×2 IMPLANT
CEMENT HV SMART SET (Cement) ×4 IMPLANT
COOLER POLAR GLACIER W/PUMP (MISCELLANEOUS) ×2 IMPLANT
COVER WAND RF STERILE (DRAPES) ×2 IMPLANT
CUFF TOURN SGL QUICK 24 (TOURNIQUET CUFF) ×1
CUFF TOURN SGL QUICK 30 (TOURNIQUET CUFF)
CUFF TRNQT CYL 24X4X16.5-23 (TOURNIQUET CUFF) IMPLANT
CUFF TRNQT CYL 30X4X21-28X (TOURNIQUET CUFF) IMPLANT
DRAPE 3/4 80X56 (DRAPES) ×2 IMPLANT
DRSG DERMACEA 8X12 NADH (GAUZE/BANDAGES/DRESSINGS) ×2 IMPLANT
DRSG OPSITE POSTOP 4X14 (GAUZE/BANDAGES/DRESSINGS) ×2 IMPLANT
DRSG TEGADERM 4X4.75 (GAUZE/BANDAGES/DRESSINGS) ×2 IMPLANT
DURAPREP 26ML APPLICATOR (WOUND CARE) ×4 IMPLANT
ELECT REM PT RETURN 9FT ADLT (ELECTROSURGICAL) ×2
ELECTRODE REM PT RTRN 9FT ADLT (ELECTROSURGICAL) ×1 IMPLANT
EX-PIN ORTHOLOCK NAV 4X150 (PIN) ×4 IMPLANT
GLOVE BIO SURGEON STRL SZ7.5 (GLOVE) ×4 IMPLANT
GLOVE BIOGEL M STRL SZ7.5 (GLOVE) ×4 IMPLANT
GLOVE BIOGEL PI IND STRL 7.5 (GLOVE) ×1 IMPLANT
GLOVE BIOGEL PI INDICATOR 7.5 (GLOVE) ×1
GLOVE INDICATOR 8.0 STRL GRN (GLOVE) ×2 IMPLANT
GOWN STRL REUS W/ TWL LRG LVL3 (GOWN DISPOSABLE) ×2 IMPLANT
GOWN STRL REUS W/ TWL XL LVL3 (GOWN DISPOSABLE) ×1 IMPLANT
GOWN STRL REUS W/TWL LRG LVL3 (GOWN DISPOSABLE) ×2
GOWN STRL REUS W/TWL XL LVL3 (GOWN DISPOSABLE) ×1
HEMOVAC 400CC 10FR (MISCELLANEOUS) ×2 IMPLANT
HOLDER FOLEY CATH W/STRAP (MISCELLANEOUS) ×2 IMPLANT
HOOD PEEL AWAY FLYTE STAYCOOL (MISCELLANEOUS) ×4 IMPLANT
KIT TURNOVER KIT A (KITS) ×2 IMPLANT
KNIFE SCULPS 14X20 (INSTRUMENTS) ×2 IMPLANT
LABEL OR SOLS (LABEL) ×2 IMPLANT
MANIFOLD NEPTUNE II (INSTRUMENTS) ×2 IMPLANT
NDL SAFETY ECLIPSE 18X1.5 (NEEDLE) ×1 IMPLANT
NDL SPNL 20GX3.5 QUINCKE YW (NEEDLE) ×2 IMPLANT
NEEDLE HYPO 18GX1.5 SHARP (NEEDLE) ×1
NEEDLE SPNL 20GX3.5 QUINCKE YW (NEEDLE) ×4 IMPLANT
NS IRRIG 500ML POUR BTL (IV SOLUTION) ×2 IMPLANT
PACK TOTAL KNEE (MISCELLANEOUS) ×2 IMPLANT
PAD WRAPON POLAR KNEE (MISCELLANEOUS) ×1 IMPLANT
PATELLA MEDIAL ATTUN 35MM KNEE (Knees) ×1 IMPLANT
PENCIL SMOKE ULTRAEVAC 22 CON (MISCELLANEOUS) ×2 IMPLANT
PIN DRILL QUICK PACK ×2 IMPLANT
PIN FIXATION 1/8DIA X 3INL (PIN) ×6 IMPLANT
PULSAVAC PLUS IRRIG FAN TIP (DISPOSABLE) ×2
SOL .9 NS 3000ML IRR  AL (IV SOLUTION) ×1
SOL .9 NS 3000ML IRR UROMATIC (IV SOLUTION) ×1 IMPLANT
SOL PREP PVP 2OZ (MISCELLANEOUS) ×2
SOLUTION PREP PVP 2OZ (MISCELLANEOUS) ×1 IMPLANT
SPONGE DRAIN TRACH 4X4 STRL 2S (GAUZE/BANDAGES/DRESSINGS) ×2 IMPLANT
STAPLER SKIN PROX 35W (STAPLE) ×2 IMPLANT
STOCKINETTE IMPERV 14X48 (MISCELLANEOUS) IMPLANT
STRAP TIBIA SHORT (MISCELLANEOUS) ×2 IMPLANT
SUCTION FRAZIER HANDLE 10FR (MISCELLANEOUS) ×1
SUCTION TUBE FRAZIER 10FR DISP (MISCELLANEOUS) ×1 IMPLANT
SUT VIC AB 0 CT1 36 (SUTURE) ×2 IMPLANT
SUT VIC AB 1 CT1 36 (SUTURE) ×4 IMPLANT
SUT VIC AB 2-0 CT2 27 (SUTURE) ×2 IMPLANT
SYR 20ML LL LF (SYRINGE) ×2 IMPLANT
SYR 30ML LL (SYRINGE) ×4 IMPLANT
TIP FAN IRRIG PULSAVAC PLUS (DISPOSABLE) ×1 IMPLANT
TOWEL OR 17X26 4PK STRL BLUE (TOWEL DISPOSABLE) ×2 IMPLANT
TOWER CARTRIDGE SMART MIX (DISPOSABLE) ×2 IMPLANT
TRAY FOLEY MTR SLVR 16FR STAT (SET/KITS/TRAYS/PACK) ×2 IMPLANT
WRAPON POLAR PAD KNEE (MISCELLANEOUS) ×2

## 2019-05-13 NOTE — Evaluation (Signed)
Physical Therapy Evaluation Patient Details Name: Caitlin Bennett MRN: [id number] DOB: 08-07-1945 Today's Date: 05/13/2019   History of Present Illness  pt is a 73 yo female admitted s/p R TKA on 05/13/19. PMH includes DDD, OA, arthritis, HTN, spondylosis  Clinical Impression  Pt admitted s/p R TKA. Pt in bed upon arrival with friend present and agreed to participate with PT. Pt previously independent with all ADLs/IADLs and is still driving. Pt lives alone but has best friend that lives in the [place] behind hers and is available to help. Pts sister who is an Therapist, sports is going to be staying with pt for the first few days following hospital discharge. Pt complains of 6/10 pain in R knee. Pt educated on HEP and performed LE therex with min cuing for correct performance. Pt presents with decreased ROM with R knee ROM -6 degrees extension - 41 degrees flexion limited by pain and bandaging. Pt performed bed mobility with min A and transfers and ambulation with min guard assist. Pt reported increased nausea and mild dizziness upon standing and ambulation to recliner so further ambulation deferred at this time. Pt presents with decreased strength, ROM, balance and activity tolerance consistent with recent surgery. Pt will benefit from skilled acute PT to address deficits. Recommendation after hospital discharge is home health PT in order to further improve deficits, improve independence and allow for return to PLOF. Pt educated on POC and recommendation and agreed.     Follow Up Recommendations Home health PT    Equipment Recommendations  Rolling walker with 5" wheels;3in1 (PT)    Recommendations for Other Services       Precautions / Restrictions Precautions Precautions: Fall Restrictions Weight Bearing Restrictions: Yes RLE Weight Bearing: Weight bearing as tolerated      Mobility  Bed Mobility Overal bed mobility: Needs Assistance Bed Mobility: Supine to Sit     Supine to sit: Min assist;HOB  elevated     General bed mobility comments: min A for LE advancement, cuing for sequencing  Transfers Overall transfer level: Needs assistance Equipment used: Rolling walker (2 wheeled) Transfers: Sit to/from Stand Sit to Stand: Min guard         General transfer comment: no physical assist to rise, cuing for hand/foot placement, steady with initial standing, pt did not use RLE at all during transfer, pt encouraged to begin accepting weight through RLE  Ambulation/Gait Ambulation/Gait assistance: Min guard Gait Distance (Feet): 5 Feet Assistive device: Rolling walker (2 wheeled) Gait Pattern/deviations: Step-to pattern;Antalgic;Trunk flexed;Decreased stride length;Decreased stance time - right;Decreased weight shift to right Gait velocity: decreased   General Gait Details: pt ambulated to recliner in room, antalgic, slow, cautious gait pattern, vc required for RW mgt, pt reported nausea and dizziness so further ambulation withheld today, pt accepting minimal weight through RLE  Stairs            Wheelchair Mobility    Modified Rankin (Stroke Patients Only)       Balance Overall balance assessment: Needs assistance Sitting-balance support: Bilateral upper extremity supported;Feet supported Sitting balance-Leahy Scale: Good Sitting balance - Comments: steady sitting EOB performing LE therex   Standing balance support: Bilateral upper extremity supported;During functional activity Standing balance-Leahy Scale: Fair Standing balance comment: reliant on UE support from RW                             Pertinent Vitals/Pain Pain Assessment: 0-10 Pain Score: 6  Pain Location:  R knee Pain Descriptors / Indicators: Aching;Discomfort;Grimacing;Sore;Operative site guarding Pain Intervention(s): Limited activity within patient's tolerance;Monitored during session;Repositioned;Ice applied    Home Living Family/patient expects to be discharged to:: Private  residence Living Arrangements: Alone Available Help at Discharge: Family;Friend(s);Available 24 hours/day(sister who is RN is staying with her when she first goes home, best friend lives behind her and available to help) Type of Home: Apartment Home Access: Stairs to enter Entrance Stairs-Rails: None Entrance Stairs-Number of Steps: half step Home Layout: One level Home Equipment: [place] - 4 wheels;Cane - single point      Prior Function Level of Independence: Independent         Comments: pt reports being completely independent with all ADLs/IADLs, still driving, reports one fall last year outside on patio no injuries occurred     Hand Dominance        Extremity/Trunk Assessment   Upper Extremity Assessment Upper Extremity Assessment: Overall WFL for tasks assessed    Lower Extremity Assessment Lower Extremity Assessment: RLE deficits/detail RLE Deficits / Details: decreased strength, ROM, balance consistent with recent surgery       Communication   Communication: No difficulties  Cognition Arousal/Alertness: Awake/alert Behavior During Therapy: WFL for tasks assessed/performed Overall Cognitive Status: Within Functional Limits for tasks assessed                                        General Comments General comments (skin integrity, edema, etc.): hemovac in place start/end of session, ice reapplied end of session    Exercises Total Joint Exercises Ankle Circles/Pumps: AROM;Both;10 reps Quad Sets: AROM;Right;10 reps Hip ABduction/ADduction: AROM;Right;10 reps Straight Leg Raises: AAROM;Both;10 reps Long Arc Quad: AROM;Right;10 reps Knee Flexion: AROM;Right;10 reps Goniometric ROM: R knee ROM -6 ext to 41 flexion Marching in Standing: AROM;Both;10 reps;Standing   Assessment/Plan    PT Assessment Patient needs continued PT services  PT Problem List Decreased strength;Decreased mobility;Decreased range of motion;Decreased activity  tolerance;Decreased balance;Decreased knowledge of use of DME;Pain       PT Treatment Interventions DME instruction;Therapeutic exercise;Gait training;Balance training;Stair training;Neuromuscular re-education;Functional mobility training;Therapeutic activities;Patient/family education    PT Goals (Current goals can be found in the Care Plan section)  Acute Rehab PT Goals Patient Stated Goal: be able to get out of bed and to/from bathroom by myself PT Goal Formulation: With patient Time For Goal Achievement: 05/27/19 Potential to Achieve Goals: Good    Frequency BID   Barriers to discharge        Co-evaluation               AM-PAC PT "6 Clicks" Mobility  Outcome Measure Help needed turning from your back to your side while in a flat bed without using bedrails?: A Little Help needed moving from lying on your back to sitting on the side of a flat bed without using bedrails?: A Little Help needed moving to and from a bed to a chair (including a wheelchair)?: A Little Help needed standing up from a chair using your arms (e.g., wheelchair or bedside chair)?: A Little Help needed to walk in hospital room?: A Little Help needed climbing 3-5 steps with a railing? : A Lot 6 Click Score: 17    End of Session Equipment Utilized During Treatment: Gait belt Activity Tolerance: Patient tolerated treatment well;Patient limited by pain;Other (comment)(limited by increased nausea) Patient left: in chair;with call bell/phone within reach;with SCD's reapplied;with  family/visitor present Nurse Communication: Mobility status PT Visit Diagnosis: Muscle weakness (generalized) (M62.81);Difficulty in walking, not elsewhere classified (R26.2);Other abnormalities of gait and mobility (R26.89);Pain Pain - Right/Left: Right Pain - part of body: Knee    Time: [id number] PT Time Calculation (min) (ACUTE ONLY): 40 min   Charges:   PT Evaluation $PT Eval Moderate Complexity: 1 Mod PT  Treatments $Therapeutic Exercise: 8-22 mins       Dalton Mille PT, DPT 4:18 PM,05/13/19 [phone]

## 2019-05-13 NOTE — Op Note (Signed)
OPERATIVE NOTE  DATE OF SURGERY:  05/13/2019  PATIENT NAME:  Caitlin Bennett   DOB: 1946-01-23  MRN: [id number]  PRE-OPERATIVE DIAGNOSIS: Degenerative arthrosis of the right knee, primary  POST-OPERATIVE DIAGNOSIS:  Same  PROCEDURE:  Right total knee arthroplasty using computer-assisted navigation  SURGEON:  Marciano Sequin. M.D.  ASSISTANT: Cassell Smiles, PA-C (present and scrubbed throughout the case, critical for assistance with exposure, retraction, instrumentation, and closure)  ANESTHESIA: spinal  ESTIMATED BLOOD LOSS: 50 mL  FLUIDS REPLACED: 1000 mL of crystalloid  TOURNIQUET TIME: 87 minutes  DRAINS: 2 medium Hemovac drains  SOFT TISSUE RELEASES: Anterior cruciate ligament, posterior cruciate ligament, deep medial collateral ligament, patellofemoral ligament  IMPLANTS UTILIZED: DePuy Attune size 4N posterior stabilized femoral component (cemented), size 4 rotating platform tibial component (cemented), 35 mm medialized dome patella (cemented), and a 5 mm stabilized rotating platform polyethylene insert.  INDICATIONS FOR SURGERY: Caitlin Bennett is a 73 y.o. year old female with a long history of progressive knee pain. X-rays demonstrated severe degenerative changes in tricompartmental fashion. The patient had not seen any significant improvement despite conservative nonsurgical intervention. After discussion of the risks and benefits of surgical intervention, the patient expressed understanding of the risks benefits and agree with plans for total knee arthroplasty.   The risks, benefits, and alternatives were discussed at length including but not limited to the risks of infection, bleeding, nerve injury, stiffness, blood clots, the need for revision surgery, cardiopulmonary complications, among others, and they were willing to proceed.  PROCEDURE IN DETAIL: The patient was brought into the operating room and, after adequate spinal anesthesia was achieved, a tourniquet was placed on  the patient's upper thigh. The patient's knee and leg were cleaned and prepped with alcohol and DuraPrep and draped in the usual sterile fashion. A "timeout" was performed as per usual protocol. The lower extremity was exsanguinated using an Esmarch, and the tourniquet was inflated to 300 mmHg. An anterior longitudinal incision was made followed by a standard mid vastus approach. The deep fibers of the medial collateral ligament were elevated in a subperiosteal fashion off of the medial flare of the tibia so as to maintain a continuous soft tissue sleeve. The patella was subluxed laterally and the patellofemoral ligament was incised. Inspection of the knee demonstrated severe degenerative changes with full-thickness loss of articular cartilage. Osteophytes were debrided using a rongeur. Anterior and posterior cruciate ligaments were excised. Two 4.0 mm Schanz pins were inserted in the femur and into the tibia for attachment of the array of trackers used for computer-assisted navigation. Hip center was identified using a circumduction technique. Distal landmarks were mapped using the computer. The distal femur and proximal tibia were mapped using the computer. The distal femoral cutting guide was positioned using computer-assisted navigation so as to achieve a 5 distal valgus cut. The femur was sized and it was felt that a size 4N femoral component was appropriate. A size 4 femoral cutting guide was positioned and the anterior cut was performed and verified using the computer. This was followed by completion of the posterior and chamfer cuts. Femoral cutting guide for the central box was then positioned in the center box cut was performed.  Attention was then directed to the proximal tibia. Medial and lateral menisci were excised. The extramedullary tibial cutting guide was positioned using computer-assisted navigation so as to achieve a 0 varus-valgus alignment and 3 posterior slope. The cut was performed and  verified using the computer. The proximal tibia was  sized and it was felt that a size 4 tibial tray was appropriate. Tibial and femoral trials were inserted followed by insertion of a 5 mm polyethylene insert. The knee was felt to be tight laterally.  The trial components were removed and the knee was brought into full extension and distracted using the [place] retractors.  The posterolateral corner was carefully released using a combination of electrocautery and Metzenbaum scissors.  Trial components were reinserted followed by placement of a 5 mm polyethylene trial.  This allowed for excellent mediolateral soft tissue balancing both in flexion and in full extension. Finally, the patella was cut and prepared so as to accommodate a 35 mm medialized dome patella. A patella trial was placed and the knee was placed through a range of motion with excellent patellar tracking appreciated. The femoral trial was removed after debridement of posterior osteophytes. The central post-hole for the tibial component was reamed followed by insertion of a keel punch. Tibial trials were then removed. Cut surfaces of bone were irrigated with copious amounts of normal saline with antibiotic solution using pulsatile lavage and then suctioned dry. Polymethylmethacrylate cement was prepared in the usual fashion using a vacuum mixer. Cement was applied to the cut surface of the proximal tibia as well as along the undersurface of a size 4 rotating platform tibial component. Tibial component was positioned and impacted into place. Excess cement was removed using [employer] fast streamer. Cement was then applied to the cut surfaces of the femur as well as along the posterior flanges of the size 4N femoral component. The femoral component was positioned and impacted into place. Excess cement was removed using [employer] fast streamer. A 5 mm polyethylene trial was inserted and the knee was brought into full extension with steady axial compression applied.  Finally, cement was applied to the backside of a 35 mm medialized dome patella and the patellar component was positioned and patellar clamp applied. Excess cement was removed using [employer] fast streamer. After adequate curing of the cement, the tourniquet was deflated after a total tourniquet time of 87 minutes. Hemostasis was achieved using electrocautery. The knee was irrigated with copious amounts of normal saline with antibiotic solution using pulsatile lavage and then suctioned dry. 20 mL of 1.3% Exparel and 60 mL of 0.25% Marcaine in 40 mL of normal saline was injected along the posterior capsule, medial and lateral gutters, and along the arthrotomy site. A 5 mm stabilized rotating platform polyethylene insert was inserted and the knee was placed through a range of motion with excellent mediolateral soft tissue balancing appreciated and excellent patellar tracking noted. 2 medium drains were placed in the wound bed and brought out through separate stab incisions. The medial parapatellar portion of the incision was reapproximated using interrupted sutures of #1 Vicryl. Subcutaneous tissue was approximated in layers using first #0 Vicryl followed #2-0 Vicryl. The skin was approximated with skin staples. A sterile dressing was applied.  The patient tolerated the procedure well and was transported to the recovery room in stable condition.    Oluwakemi Salsberry P. Holley Bouche., M.D.

## 2019-05-13 NOTE — Transfer of Care (Signed)
Immediate Anesthesia Transfer of Care Note  Patient: Caitlin Bennett  Procedure(s) Performed: COMPUTER ASSISTED TOTAL KNEE ARTHROPLASTY RIGHT (Right Knee)  Patient Location: PACU  Anesthesia Type:Spinal  Level of Consciousness: awake, alert  and oriented  Airway & Oxygen Therapy: Patient Spontanous Breathing and Patient connected to nasal cannula oxygen  Post-op Assessment: Report given to RN and Post -op Vital signs reviewed and stable  Post vital signs: Reviewed and stable  Last Vitals:  Vitals Value Taken Time  BP    Temp    Pulse    Resp    SpO2      Last Pain:  Vitals:   05/13/19 0621  TempSrc: Tympanic  PainSc: 0-No pain         Complications: No apparent anesthesia complications

## 2019-05-13 NOTE — H&P (Signed)
The patient has been re-examined, and the chart reviewed, and there have been no interval changes to the documented history and physical.    The risks, benefits, and alternatives have been discussed at length. The patient expressed understanding of the risks benefits and agreed with plans for surgical intervention.  [name] P. Juno Alers, Jr. M.D.    

## 2019-05-13 NOTE — Anesthesia Procedure Notes (Signed)
Spinal  Patient location during procedure: OR Start time: 05/13/2019 7:45 AM Staffing Anesthesiologist: Martha Clan, MD Performed: anesthesiologist  Preanesthetic Checklist Completed: patient identified, site marked, surgical consent, pre-op evaluation, timeout performed, IV checked, risks and benefits discussed and monitors and equipment checked Spinal Block Patient position: sitting Prep: DuraPrep Patient monitoring: heart rate, cardiac monitor, continuous pulse ox and blood pressure Approach: midline Location: L3-4 Injection technique: single-shot Needle Needle type: Sprotte  Needle gauge: 24 G Needle length: 9 cm Assessment Sensory level: T4 Additional Notes Technically difficult procedure, required 6x attempt to access SA space, however pt tolerated well, no parathesias, good CSF flow once SA space found, CSF clear

## 2019-05-13 NOTE — Anesthesia Preprocedure Evaluation (Signed)
Anesthesia Evaluation  Patient identified by MRN, date of birth, ID band Patient awake    Reviewed: Allergy & Precautions, H&P , NPO status , Patient's Chart, lab work & pertinent test results, reviewed documented beta blocker date and time   History of Anesthesia Complications Negative for: history of anesthetic complications  Airway Mallampati: II  TM Distance: >3 FB Neck ROM: full    Dental no notable dental hx. (+) Dental Advidsory Given, Teeth Intact   Pulmonary neg pulmonary ROS,    Pulmonary exam normal        Cardiovascular Exercise Tolerance: Good hypertension, (-) angina(-) Past MI Normal cardiovascular exam(-) dysrhythmias (-) Valvular Problems/Murmurs     Neuro/Psych negative neurological ROS  negative psych ROS   GI/Hepatic Neg liver ROS, GERD  Controlled and Medicated,  Endo/Other  negative endocrine ROS  Renal/GU negative Renal ROS  negative genitourinary   Musculoskeletal   Abdominal   Peds  Hematology negative hematology ROS (+)   Anesthesia Other Findings Past Medical History: No date: Blood in the stool     Comment:  10 yrs ago No date: Diverticulitis No date: GERD (gastroesophageal reflux disease) No date: History of abnormal Pap smear No date: History of chicken pox No date: History of colon polyps No date: History of kidney stones     Comment:  h/o  No date: Hypercholesterolemia No date: Hypertension No date: Lumbosacral spondylolysis No date: Seronegative rheumatoid arthritis (HCC)   Reproductive/Obstetrics negative OB ROS                             Anesthesia Physical Anesthesia Plan  ASA: II  Anesthesia Plan: Spinal   Post-op Pain Management:    Induction:   PONV Risk Score and Plan: 2 and Propofol infusion and TIVA  Airway Management Planned: Natural Airway and Simple Face Mask  Additional Equipment:   Intra-op Plan:   Post-operative  Plan:   Informed Consent: I have reviewed the patients History and Physical, chart, labs and discussed the procedure including the risks, benefits and alternatives for the proposed anesthesia with the patient or authorized representative who has indicated his/her understanding and acceptance.     Dental Advisory Given  Plan Discussed with: Anesthesiologist, CRNA and Surgeon  Anesthesia Plan Comments:         Anesthesia Quick Evaluation

## 2019-05-13 NOTE — Anesthesia Post-op Follow-up Note (Signed)
Anesthesia QCDR form completed.        

## 2019-05-14 NOTE — TOC Progression Note (Signed)
Transition of Care [facility]) - Progression Note    Patient Details  Name: CORRENE KOEHN MRN: [id number] Date of Birth: 1946-05-19  [facility]) CM/SW Contact  Evonda Enge, Lenice Llamas Phone Number: [phone]  05/14/2019, 9:42 AM  Clinical Narrative: Lovenox price requested.          Expected Discharge Plan and Services                                                 Social Determinants of Health (SDOH) Interventions    Readmission Risk Interventions No flowsheet data found.

## 2019-05-14 NOTE — TOC Initial Note (Signed)
Transition of Care [facility]) - Initial/Assessment Note    Patient Details  Name: Caitlin Bennett MRN: [id number] Date of Birth: 21-Sep-1945  Transition of Care [facility]) CM/SW Contact:    Caitlin Bennett, Caitlin Bennett Phone Number: [phone]  05/14/2019, 4:51 PM  Clinical Narrative: Clinical Social Worker (CSW) met with patient alone at bedside to discuss D/C plan. PT is recommending home health. CSW introduced self and explained role of CSW department. Patient was alert and oriented X4 and was sitting up in the bed. Patient reported that she lives alone in [place] and her sister Caitlin Bennett is a Therapist, sports and will be coming into town to stay with her for 1 week. Patient requested a rolling walker and bedside commode. [facility] [employer] agency representative is aware of above. Patient does not have a home health agency preference. [facility] is not in network with [facility] and could not accept patient, which is surgeon's home health agency preference. Per Lauretta Chester home health representative they can accept patient. Patient is agreeable to [facility] and was provided CMS home health list. Patient was notified of Lovenox price $140.52. CSW provided patient with Good Rx card and explained that she can use that at [facility] to bring the price of Lovenox down however it will not go towards her deductible. Patient verbalized her understanding. CSW will continue to follow and assist as needed.       Expected Discharge Plan: [place] Barriers to Discharge: Continued Medical Work up   Patient Goals and CMS Choice Patient states their goals for this hospitalization and ongoing recovery are:: To go home. CMS Medicare.gov Compare Post Acute Care list provided to:: Patient Choice offered to / list presented to : Patient  Expected Discharge Plan and Services Expected Discharge Plan: [place] In-house Referral: Clinical Social Work   Post Acute Care Choice: [place] arrangements for  the past 2 months: [place]                 DME Arranged: Bedside commode, Walker rolling DME Agency: [employer] Date DME Agency Contacted: 05/14/19 Time DME Agency Contacted: [phone] Representative spoke with at DME Agency: [place]: PT [place]: [facility] [place] Date [place]: 05/14/19 Time HH Agency Contacted: 1000 Representative spoke with at [place]: [place] Arrangements/Services Living arrangements for the past 2 months: [place] with:: Self Patient language and need for interpreter reviewed:: No Do you feel safe going back to the place where you live?: Yes      Need for Family Participation in Patient Care: No (Comment) Care giver support system in place?: Yes (comment)   Criminal Activity/Legal Involvement Pertinent to Current Situation/Hospitalization: No - Comment as needed  Activities of Daily Living Home Assistive Devices/Equipment: Contact lenses, Eyeglasses ADL Screening (condition at time of admission) Patient's cognitive ability adequate to safely complete daily activities?: Yes Is the patient deaf or have difficulty hearing?: No Does the patient have difficulty seeing, even when wearing glasses/contacts?: No Does the patient have difficulty concentrating, remembering, or making decisions?: No Patient able to express need for assistance with ADLs?: Yes Does the patient have difficulty dressing or bathing?: No Independently performs ADLs?: Yes (appropriate for developmental age) Does the patient have difficulty walking or climbing stairs?: Yes Weakness of Legs: None Weakness of Arms/Hands: None  Permission Sought/Granted Permission sought to share information with : Other (comment)(Home Health agency and DME agency.) Permission granted to share  information with : Yes, Verbal Permission Granted              Emotional Assessment Appearance:: Appears stated age Attitude/Demeanor/Rapport:  Engaged Affect (typically observed): Pleasant, Calm Orientation: : Oriented to Self, Oriented to Place, Oriented to  Time, Oriented to Situation Alcohol / Substance Use: Not Applicable Psych Involvement: No (comment)  Admission diagnosis:  PRIMARY OSTEOARTHRITIS OF RIGHT KNEE Patient Active Problem List   Diagnosis Date Noted  . Total knee replacement status 05/13/2019  . Diverticulitis 06/29/2018  . Hyperlipidemia 06/29/2018  . History of colon polyps 06/29/2018  . Thrombocytosis ([place]) 12/09/2017  . Hyperglycemia 12/19/2016  . Hypertension 09/16/2016  . Headache 09/03/2016  . Microcytosis 07/05/2016  . Knee pain, bilateral 06/21/2016  . Primary osteoarthritis of both knees 10/25/2015  . History of nonmelanoma skin cancer 10/12/2015  . Left hip pain 06/11/2015  . Health care maintenance 11/29/2014  . BMI 31.0-31.9,adult 06/06/2014  . DDD (degenerative disc disease), lumbosacral 01/19/2014  . Lumbosacral spondylosis 01/19/2014  . Overactive bladder 03/19/2013  . Family history of colon cancer 03/19/2013  . Insomnia 10/31/2010  . Screening for diabetes mellitus 10/31/2010  . Breast cancer screening 10/31/2010  . Arthritis   . Blood in the stool    PCP:  Einar Pheasant, MD Pharmacy:   [facility] [place] [address] [place] [address] Phone: [phone] Fax: [phone]  [place] [place] [address] [place] [address] Phone: [phone] Fax: [phone]  [facility] [place] [facility] [place] [address] Phone: [phone] Fax: [phone]     Social Determinants of Health (SDOH) Interventions    Readmission Risk Interventions No flowsheet data found.

## 2019-05-14 NOTE — Evaluation (Signed)
Occupational Therapy Evaluation Patient Details Name: Caitlin Bennett MRN: [id number] DOB: 1946/04/06 Today's Date: 05/14/2019    History of Present Illness pt is a 73 yo female admitted s/p R TKA on 05/13/19. PMH includes DDD, OA, arthritis, HTN, spondylosis   Clinical Impression   Pt seen for OT evaluation this date, POD#1 from above surgery. Pt was independent in all ADL prior to surgery and is eager to return to PLOF with less pain and improved safety and independence. Pt currently requires minimal assist for LB dressing and bathing while in seated position due to pain and limited AROM of R knee. Pt instructed in polar care mgt, falls prevention strategies, pet care considerations, home/routines modifications, DME/AE for LB bathing and dressing tasks, and compression stocking mgt. Handout provided to support recall and carryover. Pt verbalized understanding and expressed eagerness to become as independent as possible as quickly as possible. Pt would benefit from skilled OT services including additional instruction in dressing techniques with or without assistive devices for dressing and bathing skills to support recall and carryover prior to discharge and ultimately to maximize safety, independence, and minimize falls risk and caregiver burden. Do not currently anticipate any OT needs following this hospitalization.      Follow Up Recommendations  No OT follow up    Equipment Recommendations  3 in 1 bedside commode    Recommendations for Other Services       Precautions / Restrictions Precautions Precautions: Fall Restrictions Weight Bearing Restrictions: Yes RLE Weight Bearing: Weight bearing as tolerated      Mobility Bed Mobility Overal bed mobility: Needs Assistance Bed Mobility: Supine to Sit     Supine to sit: Supervision     General bed mobility comments: additional time/effort  Transfers Overall transfer level: Needs assistance Equipment used: Rolling walker (2  wheeled) Transfers: Sit to/from Stand Sit to Stand: Min guard         General transfer comment: cues for hand/foot placement    Balance Overall balance assessment: Needs assistance Sitting-balance support: Bilateral upper extremity supported;Feet supported Sitting balance-Leahy Scale: Good     Standing balance support: Bilateral upper extremity supported;During functional activity Standing balance-Leahy Scale: Fair Standing balance comment: reliant on UE support from RW                           ADL either performed or assessed with clinical judgement   ADL Overall ADL's : Needs assistance/impaired                                       General ADL Comments: Pt requires Min A for LB ADL, Max A for compression stocking mgt; pt reports sister will be able to provide needed level of care     Vision Baseline Vision/History: Wears glasses Wears Glasses: At all times Patient Visual Report: No change from baseline       Perception     Praxis      Pertinent Vitals/Pain Pain Assessment: 0-10 Pain Score: 6  Pain Location: R knee Pain Descriptors / Indicators: Aching;Discomfort;Grimacing;Sore;Operative site guarding Pain Intervention(s): Limited activity within patient's tolerance;Monitored during session;RN gave pain meds during session;Repositioned;Ice applied     Hand Dominance     Extremity/Trunk Assessment Upper Extremity Assessment Upper Extremity Assessment: Overall WFL for tasks assessed   Lower Extremity Assessment Lower Extremity Assessment: RLE deficits/detail RLE Deficits /  Details: expected post-op strength/ROM deficits       Communication Communication Communication: No difficulties   Cognition Arousal/Alertness: Awake/alert Behavior During Therapy: WFL for tasks assessed/performed Overall Cognitive Status: Within Functional Limits for tasks assessed                                     General Comments   vital signs stable throughout session, no complaints of dizziness during session    Exercises Other Exercises Other Exercises: Pt instructed in functional transfer training Other Exercises: Pt instructed in polar care mgt, compression stocking mgt, AE/DME, pet care considerations, and falls prevention; handout provided to support recall and carryover   Shoulder Instructions      Home Living Family/patient expects to be discharged to:: Private residence Living Arrangements: Alone Available Help at Discharge: Family;Friend(s);Available 24 hours/day(sister who is RN is staying with her when she first goes home, best friend lives behind her and available to help) Type of Home: Apartment Home Access: Stairs to enter [employer] of Steps: half step Entrance Stairs-Rails: None Home Layout: One level     Bathroom Shower/Tub: Occupational psychologist: Standard     Home Equipment: Environmental consultant - 4 wheels;Cane - single point          Prior Functioning/Environment Level of Independence: Independent        Comments: pt reports being completely independent with all ADLs/IADLs, still driving, reports one fall last year outside on patio no injuries occurred        OT Problem List: Decreased strength;Pain;Decreased range of motion;Decreased knowledge of use of DME or AE;Impaired balance (sitting and/or standing)      OT Treatment/Interventions: Self-care/ADL training;Therapeutic exercise;Therapeutic activities;DME and/or AE instruction;Patient/family education;Balance training    OT Goals(Current goals can be found in the care plan section) Acute Rehab OT Goals Patient Stated Goal: be able to get out of bed and to/from bathroom by myself OT Goal Formulation: With patient Time For Goal Achievement: 05/28/19 Potential to Achieve Goals: Good ADL Goals Pt Will Perform Lower Body Dressing: with modified independence;sit to/from stand;with adaptive equipment Pt Will Transfer  to Toilet: with modified independence;ambulating(BSC over toilet, LRAD for amb) Additional ADL Goal #1: Pt will independently instruct family/caregiver in compression stocking mgt Additional ADL Goal #2: Pt will independently instruct family/caregiver in polar care mgt  OT Frequency: Min 1X/week   Barriers to D/C:            Co-evaluation              AM-PAC OT "6 Clicks" Daily Activity     Outcome Measure Help from another person eating meals?: None Help from another person taking care of personal grooming?: None Help from another person toileting, which includes using toliet, bedpan, or urinal?: A Little Help from another person bathing (including washing, rinsing, drying)?: A Little Help from another person to put on and taking off regular upper body clothing?: None Help from another person to put on and taking off regular lower body clothing?: A Little 6 Click Score: 21   End of Session Equipment Utilized During Treatment: Gait belt;Rolling walker  Activity Tolerance: Patient tolerated treatment well Patient left: in chair;with call bell/phone within reach;Other (comment)(polar care in place)  OT Visit Diagnosis: Other abnormalities of gait and mobility (R26.89);Pain Pain - Right/Left: Right Pain - part of body: Knee  [id number] OT Time Calculation (min): 37 min Charges:  OT General Charges $OT Visit: 1 Visit OT Evaluation $OT Eval Low Complexity: 1 Low OT Treatments $Self Care/Home Management : 8-22 mins $Therapeutic Activity: 8-22 mins  Jeni Salles, MPH, MS, OTR/L ascom [phone] 05/14/19, 9:50 AM

## 2019-05-14 NOTE — Progress Notes (Addendum)
Physical Therapy Treatment Patient Details Name: Caitlin Bennett MRN: [id number] DOB: 1945/12/07 Today's Date: 05/14/2019    History of Present Illness Caitlin Bennett is a 73 yo female admitted s/p R TKA on 05/13/19. PMH includes DDD, OA, arthritis, HTN, spondylosis    PT Comments    Pt up in chair upon entry, pain well controlled. Reviewed exercises. Supervision level transfers. AMB at minGuard assist to 169ft this date without dizziness. Pt assisted to BR for urine void. Able to balance during hand hygiene at sink. ROM progressing nicely. Pt calm, confidence, and cooperative.      Follow Up Recommendations  Home health PT     Equipment Recommendations  Rolling walker with 5" wheels;3in1 (PT)    Recommendations for Other Services       Precautions / Restrictions Precautions Precautions: Fall Restrictions Weight Bearing Restrictions: Yes RLE Weight Bearing: Weight bearing as tolerated    Mobility  Bed Mobility Overal bed mobility: Needs Assistance Bed Mobility: Supine to Sit     Supine to sit: Supervision     General bed mobility comments: received up in chair  Transfers Overall transfer level: Needs assistance Equipment used: Rolling walker (2 wheeled) Transfers: Sit to/from Stand Sit to Stand: Supervision         General transfer comment: 2x in session, from recliner, and from elevated BSC  Ambulation/Gait Ambulation/Gait assistance: Min guard Gait Distance (Feet): 115 Feet Assistive device: Rolling walker (2 wheeled) Gait Pattern/deviations: Step-to pattern;Antalgic;Trunk flexed;Decreased stride length;Decreased stance time - right;Decreased weight shift to right Gait velocity: not assessed this visit   General Gait Details: unable to transition from a 3-point to a 2-point gait despite cues and instruction. Improved ability for full foot weightbearing this date.   Stairs             Wheelchair Mobility    Modified Rankin (Stroke Patients Only)        Balance Overall balance assessment: Modified Independent;No apparent balance deficits (not formally assessed) Sitting-balance support: Bilateral upper extremity supported;Feet supported Sitting balance-Leahy Scale: Good     Standing balance support: Bilateral upper extremity supported;During functional activity Standing balance-Leahy Scale: Fair Standing balance comment: reliant on UE support from RW                            Cognition Arousal/Alertness: Awake/alert Behavior During Therapy: WFL for tasks assessed/performed Overall Cognitive Status: Within Functional Limits for tasks assessed                                        Exercises Total Joint Exercises Ankle Circles/Pumps: AROM;Both;20 reps Quad Sets: AROM;10 reps;Both;Seated Short Arc Quad: AROM;Both;5 reps;Seated Heel Slides: AAROM;Right;15 reps;Supine Long Arc Quad: AROM;Right;10 reps Knee Flexion: AROM;Right;10 reps Goniometric ROM: Rt knee flexion 8-79 degrees Other Exercises Other Exercises: Pt instructed in functional transfer training Other Exercises: Pt instructed in polar care mgt, compression stocking mgt, AE/DME, pet care considerations, and falls prevention; handout provided to support recall and carryover    General Comments General comments (skin integrity, edema, etc.): vital signs stable throughout session, no complaints of dizziness during session      Pertinent Vitals/Pain Pain Assessment: 0-10 Pain Score: 2  Pain Location: Rt knee Pain Descriptors / Indicators: Aching;Discomfort;Grimacing;Sore;Operative site guarding Pain Intervention(s): Limited activity within patient's tolerance;Monitored during session;Premedicated before session;Repositioned;Patient requesting pain meds-RN notified;RN gave pain meds  during session    [place] expects to be discharged to:: Private residence Living Arrangements: Alone Available Help at Discharge:  Family;Friend(s);Available 24 hours/day(sister who is RN is staying with her when she first goes home, best friend lives behind her and available to help) Type of Home: Apartment Home Access: Stairs to enter Entrance Stairs-Rails: None Home Layout: One level Home Equipment: Environmental consultant - 4 wheels;Cane - single point      Prior Function Level of Independence: Independent      Comments: pt reports being completely independent with all ADLs/IADLs, still driving, reports one fall last year outside on patio no injuries occurred   PT Goals (current goals can now be found in the care plan section) Acute Rehab PT Goals Patient Stated Goal: be able to get out of bed and to/from bathroom by myself PT Goal Formulation: With patient Time For Goal Achievement: 05/27/19 Potential to Achieve Goals: Good Progress towards PT goals: Progressing toward goals    Frequency    BID      PT Plan Current plan remains appropriate    Co-evaluation              AM-PAC PT "6 Clicks" Mobility   Outcome Measure  Help needed turning from your back to your side while in a flat bed without using bedrails?: A Little Help needed moving from lying on your back to sitting on the side of a flat bed without using bedrails?: A Little Help needed moving to and from a bed to a chair (including a wheelchair)?: A Little Help needed standing up from a chair using your arms (e.g., wheelchair or bedside chair)?: A Little Help needed to walk in hospital room?: A Little Help needed climbing 3-5 steps with a railing? : A Little 6 Click Score: 18    End of Session Equipment Utilized During Treatment: Gait belt Activity Tolerance: Patient tolerated treatment well Patient left: in chair;with call bell/phone within reach;with SCD's reapplied Nurse Communication: Mobility status PT Visit Diagnosis: Muscle weakness (generalized) (M62.81);Difficulty in walking, not elsewhere classified (R26.2);Other abnormalities of gait and  mobility (R26.89);Pain Pain - Right/Left: Right Pain - part of body: Knee     Time: [id number] PT Time Calculation (min) (ACUTE ONLY): 34 min  Charges:  $Gait Training: 8-22 mins $Therapeutic Exercise: 8-22 mins                .  12:26 PM, 05/14/19 Etta Grandchild, PT, DPT Physical Therapist - [facility]  [phone] ([place])    [place] 05/14/2019, 12:24 PM

## 2019-05-14 NOTE — Progress Notes (Addendum)
Physical Therapy Treatment Patient Details Name: Caitlin Bennett MRN: [id number] DOB: 26-Nov-1945 Today's Date: 05/14/2019    History of Present Illness Caitlin Bennett is a 73 yo female admitted s/p R TKA on 05/13/19. PMH includes DDD, OA, arthritis, HTN, spondylosis    PT Comments    Pt still up in chair from AM session, lunch hath been eaten in completion. Pain remains well controlled, 3/10. Pt able to perform SLRx5, seated flexion/extension, other HEP without pain limitation. Pt able to advance AMB to 120ft, but grew tired at the halfway point albeit denies dizziness yet again this date. Pt able to return to supine with modified independence after some coaching and supervision for safety. Pt progressing well in general. Pt asked to perform entire HEP handout again this evening to establish ability to perform without assistance.    Follow Up Recommendations  Home health PT     Equipment Recommendations  Rolling walker with 5" wheels;3in1 (PT)    Recommendations for Other Services       Precautions / Restrictions Precautions Precautions: Fall Restrictions Weight Bearing Restrictions: Yes RLE Weight Bearing: Weight bearing as tolerated    Mobility  Bed Mobility Overal bed mobility: Needs Assistance Bed Mobility: Sit to Supine       Sit to supine: Min guard   General bed mobility comments: received up in chair; educated on foot hook for back into bed, performred with success.  Transfers Overall transfer level: Needs assistance Equipment used: Rolling walker (2 wheeled) Transfers: Sit to/from Stand Sit to Stand: Supervision         General transfer comment: 2x in session, from recliner, and from elevated BSC  Ambulation/Gait Ambulation/Gait assistance: Min guard Gait Distance (Feet): 165 Feet Assistive device: Rolling walker (2 wheeled) Gait Pattern/deviations: Step-to pattern;Antalgic;Trunk flexed;Decreased stride length;Decreased stance time - right;Decreased weight  shift to right Gait velocity: 0.71m/s s   General Gait Details: still attempting transition from a 3-point to a 2-point gait with very near success, but still limited adn difficult. Pt performed great heel strike without cue, Pt still using straight knee for RLE swing phase will eventually need cues to allow passive knee flexion in swing phase.   Stairs             Wheelchair Mobility    Modified Rankin (Stroke Patients Only)       Balance Overall balance assessment: Mild deficits observed, not formally tested;Modified Independent                                          Cognition Arousal/Alertness: Awake/alert Behavior During Therapy: WFL for tasks assessed/performed Overall Cognitive Status: Within Functional Limits for tasks assessed                                        Exercises Total Joint Exercises Ankle Circles/Pumps: AROM;Both;20 reps Quad Sets: AROM;Both;Seated;20 reps Short Arc Quad: AROM;Right;10 reps;Supine;Limitations Short Arc Quad Limitations: educated with handout in hand for HEP independence Heel Slides: AAROM;Right;15 reps;Supine Hip ABduction/ADduction: AROM;Right;10 reps;Limitations;Supine Hip Abduction/Adduction Limitations: educated with handout in hand for HEP independence Straight Leg Raises: 5 reps;Right;Supine;AROM Long Arc Quad: AROM;Right;10 reps Knee Flexion: AROM;Right;10 reps Goniometric ROM: Rt knee flexion 8-79 degrees    General Comments        Pertinent Vitals/Pain Pain Assessment:  0-10 Pain Score: 3  Pain Location: Rt knee Pain Descriptors / Indicators: Aching;Discomfort;Grimacing;Sore;Operative site guarding Pain Intervention(s): Limited activity within patient's tolerance;Monitored during session;Premedicated before session;Repositioned;Patient requesting pain meds-RN notified;RN gave pain meds during session;Ice applied    Home Living                      Prior Function             PT Goals (current goals can now be found in the care plan section) Acute Rehab PT Goals Patient Stated Goal: be able to get out of bed and to/from bathroom by myself PT Goal Formulation: With patient Time For Goal Achievement: 05/27/19 Potential to Achieve Goals: Good Progress towards PT goals: Progressing toward goals    Frequency    BID      PT Plan Current plan remains appropriate    Co-evaluation              AM-PAC PT "6 Clicks" Mobility   Outcome Measure  Help needed turning from your back to your side while in a flat bed without using bedrails?: A Little Help needed moving from lying on your back to sitting on the side of a flat bed without using bedrails?: A Little Help needed moving to and from a bed to a chair (including a wheelchair)?: A Little Help needed standing up from a chair using your arms (e.g., wheelchair or bedside chair)?: A Little Help needed to walk in hospital room?: A Little Help needed climbing 3-5 steps with a railing? : A Little 6 Click Score: 18    End of Session Equipment Utilized During Treatment: Gait belt Activity Tolerance: Patient tolerated treatment well;No increased pain Patient left: with call bell/phone within reach;with SCD's reapplied;in bed;with family/visitor present;with bed alarm set Nurse Communication: Mobility status PT Visit Diagnosis: Muscle weakness (generalized) (M62.81);Difficulty in walking, not elsewhere classified (R26.2);Other abnormalities of gait and mobility (R26.89);Pain Pain - Right/Left: Right Pain - part of body: Knee     Time: 1325-1350 PT Time Calculation (min) (ACUTE ONLY): 25 min  Charges:  $Gait Training: 8-22 mins $Therapeutic Exercise: 8-22 mins                     3:04 PM, 05/14/19 Etta Grandchild, PT, DPT Physical Therapist - [facility]  [phone] ([place])    Emmett C 05/14/2019, 3:01 PM

## 2019-05-14 NOTE — TOC Benefit Eligibility Note (Signed)
Transition of Care [facility]) Benefit Eligibility Note    Patient Details  Name: Caitlin Bennett MRN: [id number] Date of Birth: [birth year]   Medication/Dose: Enoxaparin 40mg  once daily for 14 days  Covered?: Yes  Prescription Coverage Preferred Pharmacy: [facility] with Person/Company/Phone Number:: Ricardo Jericho with [employer] at [phone]  Co-Pay: $140.52 estimated copay  Prior Approval: No  Deductible: Unmet   Dannette Barbara Phone Number: [phone] 05/14/2019, 10:20 AM

## 2019-05-14 NOTE — Anesthesia Postprocedure Evaluation (Signed)
Anesthesia Post Note  Patient: Caitlin Bennett  Procedure(s) Performed: COMPUTER ASSISTED TOTAL KNEE ARTHROPLASTY RIGHT (Right Knee)  Patient location during evaluation: Nursing Unit Anesthesia Type: Spinal Level of consciousness: oriented and awake and alert Pain management: pain level controlled Vital Signs Assessment: post-procedure vital signs reviewed and stable Respiratory status: spontaneous breathing and respiratory function stable Cardiovascular status: blood pressure returned to baseline and stable Postop Assessment: no headache, no backache, no apparent nausea or vomiting and patient able to bend at knees Anesthetic complications: no     Last Vitals:  Vitals:   05/13/19 2304 05/14/19 0455  BP: 115/73 117/77  Pulse: 89 85  Resp: 18 18  Temp: 36.6 C 36.8 C  SpO2: 97% 97%    Last Pain:  Vitals:   05/14/19 0516  TempSrc:   PainSc: 6                  Caryl Asp

## 2019-05-14 NOTE — Care Management Important Message (Signed)
Important Message  Patient Details  Name: Caitlin Bennett MRN: [id number] Date of Birth: Jan 29, 1946   Medicare Important Message Given:  Yes  Initial Medicare IM given by Patient Access Associate on 05/14/2019 at 9:23am.   Dannette Barbara 05/14/2019, 3:09 PM

## 2019-05-14 NOTE — Progress Notes (Signed)
  Subjective: 1 Day Post-Op Procedure(s) (LRB): COMPUTER ASSISTED TOTAL KNEE ARTHROPLASTY RIGHT (Right) Patient reports pain as  well-controlled.   Patient is well, and has had no acute complaints or problems Plan is to go Home after hospital stay. Negative for chest pain and shortness of breath Fever: no Gastrointestinal: negative for nausea and vomiting.  Patient has not had a bowel movement.  Objective: Vital signs in last 24 hours: Temp:  [96.8 F (36 C)-98.3 F (36.8 C)] 98.3 F (36.8 C) (10/29 0455) Pulse Rate:  [82-95] 85 (10/29 0455) Resp:  [10-20] 18 (10/29 0455) BP: (108-144)/(73-95) 117/77 (10/29 0455) SpO2:  [94 %-100 %] 97 % (10/29 0455)  Intake/Output from previous day:  Intake/Output Summary (Last 24 hours) at 05/14/2019 0742 Last data filed at 05/14/2019 0515 Gross per 24 hour  Intake 3705.78 ml  Output 1965 ml  Net 1740.78 ml    Intake/Output this shift: No intake/output data recorded.  Labs: No results for input(s): HGB in the last 72 hours. No results for input(s): WBC, RBC, HCT, PLT in the last 72 hours. No results for input(s): NA, K, CL, CO2, BUN, CREATININE, GLUCOSE, CALCIUM in the last 72 hours. No results for input(s): LABPT, INR in the last 72 hours.   EXAM General - Patient is Alert, Appropriate and Oriented Extremity - Neurovascular intact Dorsiflexion/Plantar flexion intact Compartment soft Dressing/Incision -postoperative dressing remains in place.  Hemovac remains in place. Motor Function - intact, moving foot and toes well on exam.  Able to perform straight leg raise. Cardiovascular- Regular rate and rhythm, no murmurs/rubs/gallops Respiratory- Lungs clear to auscultation bilaterally Gastrointestinal- active bowel sounds   Assessment/Plan: 1 Day Post-Op Procedure(s) (LRB): COMPUTER ASSISTED TOTAL KNEE ARTHROPLASTY RIGHT (Right) Active Problems:   Total knee replacement status  Estimated body mass index is 30.04 kg/m as  calculated from the following:   Height as of this encounter: 5\' 1"  (1.549 m).   Weight as of this encounter: 72.1 kg. Advance diet Up with therapy Plan for discharge tomorrow    DVT Prophylaxis - Lovenox and foot pumps Weight-Bearing as tolerated to right leg  Cassell Smiles, PA-C [facility] 05/14/2019, 7:42 AM

## 2019-05-15 ENCOUNTER — Other Ambulatory Visit: Payer: Medicare HMO

## 2019-05-15 MED ORDER — OXYCODONE HCL 5 MG PO TABS: 5.0000 mg | ORAL_TABLET | ORAL | 0 refills | Status: DC | PRN

## 2019-05-15 MED ORDER — CELECOXIB 200 MG PO CAPS: 200.0000 mg | ORAL_CAPSULE | Freq: Two times a day (BID) | ORAL | 0 refills | Status: DC

## 2019-05-15 MED ORDER — ENOXAPARIN SODIUM 40 MG/0.4ML ~~LOC~~ SOLN: 40.0000 mg | SUBCUTANEOUS | 0 refills | Status: DC

## 2019-05-15 NOTE — Progress Notes (Signed)
Physical Therapy Treatment Patient Details Name: Caitlin Bennett MRN: [id number] DOB: 1946-04-09 Today's Date: 05/15/2019    History of Present Illness Caitlin Bennett is a 73 yo female admitted s/p R TKA on 05/13/19. PMH includes DDD, OA, arthritis, HTN, spondylosis    PT Comments    Pt is progressing toward goals. Upon entry, pt is resting in recliner stating minimal pain. This date, pt performs transfers and ambulation with CGA/supervision and use of RW. Pt was able to perform stair safely during this session; her stair is shorter than the one used to practice and pt feels confident to perform. Pt will continue to benefit from skilled PT to address deficits in strength, ROM, and mobility.     Follow Up Recommendations  Home health PT     Equipment Recommendations  Rolling walker with 5" wheels;3in1 (PT)    Recommendations for Other Services       Precautions / Restrictions Precautions Precautions: Fall;Knee Precaution Booklet Issued: Yes (comment) Restrictions Weight Bearing Restrictions: Yes RLE Weight Bearing: Weight bearing as tolerated    Mobility  Bed Mobility               General bed mobility comments: in recliner at entry/exit  Transfers Overall transfer level: Needs assistance Equipment used: Rolling walker (2 wheeled) Transfers: Sit to/from Stand Sit to Stand: Supervision         General transfer comment: Able to transfer with only supervision from PT; no delay or LOB evident  Ambulation/Gait Ambulation/Gait assistance: Min guard Gait Distance (Feet): 200 Feet Assistive device: Rolling walker (2 wheeled) Gait Pattern/deviations: Step-to pattern;Antalgic;Trunk flexed;Decreased stride length;Decreased stance time - right;Decreased weight shift to right     General Gait Details: Pt amb with antalgic gait pattern with step-to technique and use of RW. Pt is moving quite quickly and reports less pain with this activity today   Stairs Stairs: Yes Stairs  assistance: Min guard Stair Management: No rails;With walker Number of Stairs: 1 General stair comments: Able to perform safely with step-to pattern. Instruction provided "up with good, down with bad"   Wheelchair Mobility    Modified Rankin (Stroke Patients Only)       Balance Overall balance assessment: Needs assistance Sitting-balance support: Feet supported Sitting balance-Leahy Scale: Good     Standing balance support: Bilateral upper extremity supported;During functional activity Standing balance-Leahy Scale: Fair Standing balance comment: Relies on UE support; Pt has difficulty tolerating challenge i.e going up a step                            Cognition Arousal/Alertness: Awake/alert Behavior During Therapy: WFL for tasks assessed/performed Overall Cognitive Status: Within Functional Limits for tasks assessed                                        Exercises Total Joint Exercises Goniometric ROM: R Knee ROM: -3-85 Other Exercises Other Exercises: Seated in recliner, 10 reps, R LE: AP, QS, HS, hip ad/ab, LAQ; PT supervision    General Comments        Pertinent Vitals/Pain Pain Assessment: 0-10 Pain Score: 4  Pain Location: Rt knee Pain Descriptors / Indicators: Aching;Discomfort;Grimacing;Sore;Operative site guarding Pain Intervention(s): Limited activity within patient's tolerance;Monitored during session;Repositioned;Ice applied;Patient requesting pain meds-RN notified    Home Living  Prior Function            PT Goals (current goals can now be found in the care plan section) Acute Rehab PT Goals Patient Stated Goal: be able to get out of bed and to/from bathroom by myself PT Goal Formulation: With patient Time For Goal Achievement: 05/27/19 Potential to Achieve Goals: Good Progress towards PT goals: Progressing toward goals    Frequency    BID      PT Plan Current plan remains  appropriate    Co-evaluation              AM-PAC PT "6 Clicks" Mobility   Outcome Measure  Help needed turning from your back to your side while in a flat bed without using bedrails?: A Little Help needed moving from lying on your back to sitting on the side of a flat bed without using bedrails?: A Little Help needed moving to and from a bed to a chair (including a wheelchair)?: A Little Help needed standing up from a chair using your arms (e.g., wheelchair or bedside chair)?: A Little Help needed to walk in hospital room?: A Little Help needed climbing 3-5 steps with a railing? : A Little 6 Click Score: 18    End of Session Equipment Utilized During Treatment: Gait belt Activity Tolerance: Patient tolerated treatment well;No increased pain Patient left: in chair;with call bell/phone within reach;with SCD's reapplied Nurse Communication: Mobility status PT Visit Diagnosis: Muscle weakness (generalized) (M62.81);Difficulty in walking, not elsewhere classified (R26.2);Other abnormalities of gait and mobility (R26.89);Pain Pain - Right/Left: Right Pain - part of body: Knee     Time: [id number] PT Time Calculation (min) (ACUTE ONLY): 23 min  Charges:                        Dixie Dials, SPT    Ambrosio Reuter 05/15/2019, 12:12 PM

## 2019-05-15 NOTE — TOC Transition Note (Signed)
Transition of Care [facility]) - CM/SW Discharge Note   Patient Details  Name: Caitlin Bennett MRN: [id number] Date of Birth: 1945-08-19  Transition of Care [facility]) CM/SW Contact:  Dameka Younker, Lenice Llamas Phone Number: [phone]  05/15/2019, 1:13 PM   Clinical Narrative: Clinical Social Worker (CSW) made [place] home health representative aware that patient will D/C home today. [employer] D[employer] agency representative is aware that patient needs a rolling walker and bedside commode. Patient was notified of her Lovenox price $140.52 and was given a good Rx card. Please reconsult if future social work needs arise. CSW signing off.      Final next level of care: [place] Barriers to Discharge: Barriers Resolved   Patient Goals and CMS Choice Patient states their goals for this hospitalization and ongoing recovery are:: To go home. CMS Medicare.gov Compare Post Acute Care list provided to:: Patient Choice offered to / list presented to : Patient  Discharge Placement                       Discharge Plan and Services In-house Referral: Clinical Social Work   Post Acute Care Choice: Home Health          DME Arranged: Bedside commode, Walker rolling DME Agency: [employer] Date DME Agency Contacted: 05/14/19 Time DME Agency Contacted: [phone] Representative spoke with at DME Agency: [place]: PT [place]: [place] Date [place]: 05/15/19 Time [place]: [place] Representative spoke with at [place]: [place] ([place]) Interventions     Readmission Risk Interventions No flowsheet data found.

## 2019-05-15 NOTE — Progress Notes (Addendum)
  Subjective: 2 Days Post-Op Procedure(s) (LRB): COMPUTER ASSISTED TOTAL KNEE ARTHROPLASTY RIGHT (Right) Patient reports pain as  well controlled.   Patient is well, and has had no acute complaints or problems Plan is to go Home after hospital stay. Negative for chest pain and shortness of breath Fever: no Gastrointestinal: negative for nausea and vomiting.  Patient has had a bowel movement.  Objective: Vital signs in last 24 hours: Temp:  [97.8 F (36.6 C)-98.5 F (36.9 C)] 98.5 F (36.9 C) (10/29 2348) Pulse Rate:  [77-95] 95 (10/29 2348) Resp:  [15-18] 18 (10/29 2348) BP: (126-133)/(83-90) 126/85 (10/29 2348) SpO2:  [95 %-97 %] 95 % (10/29 2348)  Intake/Output from previous day:  Intake/Output Summary (Last 24 hours) at 05/15/2019 0801 Last data filed at 05/14/2019 2351 Gross per 24 hour  Intake 1348.76 ml  Output 201 ml  Net 1147.76 ml    Intake/Output this shift: No intake/output data recorded.  Labs: No results for input(s): HGB in the last 72 hours. No results for input(s): WBC, RBC, HCT, PLT in the last 72 hours. No results for input(s): NA, K, CL, CO2, BUN, CREATININE, GLUCOSE, CALCIUM in the last 72 hours. No results for input(s): LABPT, INR in the last 72 hours.   EXAM General - Patient is Alert, Appropriate and Oriented Extremity - Neurovascular intact Dorsiflexion/Plantar flexion intact Compartment soft Dressing/Incision -postoperative dressing and Hemovac in place.  Polar Care in place and functioning properly. Motor Function - intact, moving foot and toes well on exam.  Able to form straight leg raise. Cardiovascular- Regular rate and rhythm, no murmurs/rubs/gallops Respiratory- Lungs clear to auscultation bilaterally Gastrointestinal- active bowel sounds   Assessment/Plan: 2 Days Post-Op Procedure(s) (LRB): COMPUTER ASSISTED TOTAL KNEE ARTHROPLASTY RIGHT (Right) Active Problems:   Total knee replacement status  Estimated body mass index is  30.04 kg/m as calculated from the following:   Height as of this encounter: 5\' 1"  (1.549 m).   Weight as of this encounter: 72.1 kg. Advance diet Up with therapy Discharge home with home health  Postoperative dressing and Hemovac removed.  New honeycomb dressing placed.  DVT Prophylaxis - Lovenox, Ted hose and foot pumps Weight-Bearing as tolerated to right leg  Cassell Smiles, PA-C [facility] 05/15/2019, 8:01 AM

## 2019-05-15 NOTE — Discharge Summary (Signed)
Physician Discharge Summary  [name] MRN: [id number] DOB/AGE: [birth year] 73 y.o.  Admit date: 05/13/2019 Discharge date: 05/15/2019  Admission Diagnoses:  PRIMARY OSTEOARTHRITIS OF RIGHT KNEE  Surgeries:  PROCEDURE:  Right total knee arthroplasty using computer-assisted navigation  SURGEON:  Marciano Sequin. M.D.  ASSISTANT: Cassell Smiles, PA-C (present and scrubbed throughout the case, critical for assistance with exposure, retraction, instrumentation, and closure)  ANESTHESIA: spinal  ESTIMATED BLOOD LOSS: 50 mL  FLUIDS REPLACED: 1000 mL of crystalloid  TOURNIQUET TIME: 87 minutes  DRAINS: 2 medium Hemovac drains  SOFT TISSUE RELEASES: Anterior cruciate ligament, posterior cruciate ligament, deep medial collateral ligament, patellofemoral ligament  IMPLANTS UTILIZED: DePuy Attune size 4N posterior stabilized femoral component (cemented), size 4 rotating platform tibial component (cemented), 35 mm medialized dome patella (cemented), and a 5 mm stabilized rotating platform polyethylene insert. Discharge Diagnoses: Patient Active Problem List   Diagnosis Date Noted  . Total knee replacement status 05/13/2019  . Diverticulitis 06/29/2018  . Hyperlipidemia 06/29/2018  . History of colon polyps 06/29/2018  . Thrombocytosis ([place]) 12/09/2017  . Hyperglycemia 12/19/2016  . Hypertension 09/16/2016  . Headache 09/03/2016  . Microcytosis 07/05/2016  . Knee pain, bilateral 06/21/2016  . Primary osteoarthritis of both knees 10/25/2015  . History of nonmelanoma skin cancer 10/12/2015  . Left hip pain 06/11/2015  . Health care maintenance 11/29/2014  . BMI 31.0-31.9,adult 06/06/2014  . DDD (degenerative disc disease), lumbosacral 01/19/2014  . Lumbosacral spondylosis 01/19/2014  . Overactive bladder 03/19/2013  . Family history of colon cancer 03/19/2013  . Insomnia 10/31/2010  . Screening for diabetes mellitus 10/31/2010  . Breast cancer screening  10/31/2010  . Arthritis   . Blood in the stool     Past Medical History:  Diagnosis Date  . Blood in the stool    10 yrs ago  . Diverticulitis   . GERD (gastroesophageal reflux disease)   . History of abnormal Pap smear   . History of chicken pox   . History of colon polyps   . History of kidney stones    h/o   . Hypercholesterolemia   . Hypertension   . Lumbosacral spondylolysis   . Seronegative rheumatoid arthritis ([place])      Transfusion:    Consultants (if any):   Discharged Condition: Improved  Hospital Course: Caitlin Bennett is an 73 y.o. female who was admitted 05/13/2019 with a diagnosis of right knee osteoarthritis and went to the operating room on 05/13/2019 and underwent right total knee arthroplasty. The patient received perioperative antibiotics for prophylaxis (see below). The patient tolerated the procedure well and was transported to PACU in stable condition. After meeting PACU criteria, the patient was subsequently transferred to the Orthopaedics/Rehabilitation unit.   The patient received DVT prophylaxis in the form of early mobilization, Lovenox, Foot Pumps and TED hose. A sacral pad had been placed and heels were elevated off of the bed with rolled towels in order to protect skin integrity. Foley catheter was discontinued on postoperative day #1. Wound drains were discontinued on postoperative day #2. The surgical incision was healing well without signs of infection.  Physical therapy was initiated postoperatively for transfers, gait training, and strengthening. Occupational therapy was initiated for activities of daily living and evaluation for assisted devices. Rehabilitation goals were reviewed in detail with the patient. The patient made steady progress with physical therapy and physical therapy recommended discharge to Home.   The patient achieved his preliminary goals of this hospitalization and was  felt to be medically and orthopaedically appropriate for  discharge.  She was given perioperative antibiotics:  Anti-infectives (From admission, onward)   Start     Dose/Rate Route Frequency Ordered Stop   05/13/19 1400  ceFAZolin (ANCEF) IVPB 2g/100 mL premix     2 g 200 mL/hr over 30 Minutes Intravenous Every 6 hours 05/13/19 1250 05/14/19 0925   05/13/19 0616  ceFAZolin (ANCEF) 2-4 GM/100ML-% IVPB    Note to Pharmacy: Myles Lipps   : cabinet override      05/13/19 0616 05/13/19 0805   05/13/19 0615  ceFAZolin (ANCEF) IVPB 2g/100 mL premix     2 g 200 mL/hr over 30 Minutes Intravenous On call to O.R. 05/13/19 [id number] 05/13/19 0805    .  Recent vital signs:  Vitals:   05/14/19 1557 05/14/19 2348  BP: 131/83 126/85  Pulse: 77 95  Resp: 15 18  Temp: 97.8 F (36.6 C) 98.5 F (36.9 C)  SpO2: 97% 95%    Recent laboratory studies:  No results for input(s): WBC, HGB, HCT, PLT, K, CL, CO2, BUN, CREATININE, GLUCOSE, CALCIUM, LABPT, INR in the last 72 hours.  Diagnostic Studies: Dg Knee Right Port  Result Date: 05/13/2019 CLINICAL DATA:  Status post total knee replacement EXAM: PORTABLE RIGHT KNEE - 1-2 VIEW COMPARISON:  None. FINDINGS: Status post right knee total arthroplasty with expected overlying postoperative changes. No evidence of component malpositioning or perihardware fracture. Osteotomy sites of the distal femur and proximal tibia in keeping with prior external fixation. IMPRESSION: Status post right knee total arthroplasty with expected overlying postoperative changes. No evidence of component malpositioning or perihardware fracture. Electronically Signed   By: Eddie Candle M.D.   On: 05/13/2019 11:21    Discharge Medications:   Allergies as of 05/15/2019      Reactions   Sumycin [tetracycline Hcl] Rash      Medication List    STOP taking these medications   aspirin EC 81 MG tablet   meloxicam 15 MG tablet Commonly known as: MOBIC   traMADol 50 MG tablet Commonly known as: ULTRAM     TAKE these medications    calcium carbonate 750 MG chewable tablet Commonly known as: TUMS EX Chew 1 tablet by mouth daily.   celecoxib 200 MG capsule Commonly known as: CELEBREX Take 1 capsule (200 mg total) by mouth 2 (two) times daily.   diphenhydramine-acetaminophen 25-500 MG Tabs tablet Commonly known as: TYLENOL PM Take 1 tablet by mouth at bedtime.   enoxaparin 40 MG/0.4ML injection Commonly known as: LOVENOX Inject 0.4 mLs (40 mg total) into the skin daily for 14 days.   Fish Oil 1200 MG Caps Take 1,200 mg by mouth daily.   lisinopril 10 MG tablet Commonly known as: ZESTRIL TAKE 1 TABLET DAILY What changed: when to take this   multivitamin with minerals Tabs tablet Take 1 tablet by mouth daily.   oxyCODONE 5 MG immediate release tablet Commonly known as: Oxy IR/ROXICODONE Take 1 tablet (5 mg total) by mouth every 4 (four) hours as needed for moderate pain (pain score 4-6).   pantoprazole 40 MG tablet Commonly known as: PROTONIX TAKE 1 TABLET DAILY What changed: when to take this   phentermine 30 MG capsule Take 30 mg by mouth daily before breakfast.   PROBIOTIC PO Take 1 capsule by mouth daily.   simvastatin 20 MG tablet Commonly known as: ZOCOR TAKE 1 TABLET EVERY EVENING   vitamin E 400 UNIT capsule Take 400 Units by mouth  daily.            Durable Medical Equipment  (From admission, onward)         Start     Ordered   05/13/19 1251  DME Walker rolling  Once    Question:  Patient needs a walker to treat with the following condition  Answer:  Total knee replacement status   05/13/19 1250   05/13/19 1251  DME Bedside commode  Once    Question:  Patient needs a bedside commode to treat with the following condition  Answer:  Total knee replacement status   05/13/19 1250          Disposition: Discharge disposition: 01-Home or Self Care         Follow-up Information    Watt Climes, PA On 05/27/2019.   Specialty: Physician Assistant Why: at 9:15am  Contact information: [place] [address] [phone]        Dereck Leep, MD On 06/25/2019.   Specialty: Orthopedic Surgery Why: at 9:45am Contact information: [place] [address] [place], PA-C 05/15/2019, 8:11 AM

## 2019-05-15 NOTE — Progress Notes (Signed)
Patient discharging from unit today. Family to provided transportation.  Patient demonstrates no s/sx of distress. All discharge information reviewed with patient.  Medications and follow up appts reviewed with patient. All personal belongings with patient.

## 2019-05-19 DIAGNOSIS — I10 Essential (primary) hypertension: Secondary | ICD-10-CM | POA: Diagnosis not present

## 2019-05-19 DIAGNOSIS — E785 Hyperlipidemia, unspecified: Secondary | ICD-10-CM | POA: Diagnosis not present

## 2019-05-19 DIAGNOSIS — M479 Spondylosis, unspecified: Secondary | ICD-10-CM | POA: Diagnosis not present

## 2019-05-19 DIAGNOSIS — M1712 Unilateral primary osteoarthritis, left knee: Secondary | ICD-10-CM | POA: Diagnosis not present

## 2019-05-19 DIAGNOSIS — N319 Neuromuscular dysfunction of bladder, unspecified: Secondary | ICD-10-CM | POA: Diagnosis not present

## 2019-05-19 DIAGNOSIS — Z471 Aftercare following joint replacement surgery: Secondary | ICD-10-CM | POA: Diagnosis not present

## 2019-05-19 DIAGNOSIS — K5790 Diverticulosis of intestine, part unspecified, without perforation or abscess without bleeding: Secondary | ICD-10-CM | POA: Diagnosis not present

## 2019-05-19 DIAGNOSIS — K219 Gastro-esophageal reflux disease without esophagitis: Secondary | ICD-10-CM | POA: Diagnosis not present

## 2019-05-19 DIAGNOSIS — Z96651 Presence of right artificial knee joint: Secondary | ICD-10-CM | POA: Diagnosis not present

## 2019-05-19 DIAGNOSIS — M5137 Other intervertebral disc degeneration, lumbosacral region: Secondary | ICD-10-CM | POA: Diagnosis not present

## 2019-05-21 DIAGNOSIS — M1712 Unilateral primary osteoarthritis, left knee: Secondary | ICD-10-CM | POA: Diagnosis not present

## 2019-05-21 DIAGNOSIS — K5790 Diverticulosis of intestine, part unspecified, without perforation or abscess without bleeding: Secondary | ICD-10-CM | POA: Diagnosis not present

## 2019-05-21 DIAGNOSIS — Z471 Aftercare following joint replacement surgery: Secondary | ICD-10-CM | POA: Diagnosis not present

## 2019-05-21 DIAGNOSIS — M479 Spondylosis, unspecified: Secondary | ICD-10-CM | POA: Diagnosis not present

## 2019-05-21 DIAGNOSIS — K219 Gastro-esophageal reflux disease without esophagitis: Secondary | ICD-10-CM | POA: Diagnosis not present

## 2019-05-21 DIAGNOSIS — M5137 Other intervertebral disc degeneration, lumbosacral region: Secondary | ICD-10-CM | POA: Diagnosis not present

## 2019-05-21 DIAGNOSIS — N319 Neuromuscular dysfunction of bladder, unspecified: Secondary | ICD-10-CM | POA: Diagnosis not present

## 2019-05-21 DIAGNOSIS — E785 Hyperlipidemia, unspecified: Secondary | ICD-10-CM | POA: Diagnosis not present

## 2019-05-21 DIAGNOSIS — I10 Essential (primary) hypertension: Secondary | ICD-10-CM | POA: Diagnosis not present

## 2019-05-21 DIAGNOSIS — Z96651 Presence of right artificial knee joint: Secondary | ICD-10-CM | POA: Diagnosis not present

## 2019-05-22 DIAGNOSIS — M479 Spondylosis, unspecified: Secondary | ICD-10-CM | POA: Diagnosis not present

## 2019-05-22 DIAGNOSIS — I10 Essential (primary) hypertension: Secondary | ICD-10-CM | POA: Diagnosis not present

## 2019-05-22 DIAGNOSIS — M5137 Other intervertebral disc degeneration, lumbosacral region: Secondary | ICD-10-CM | POA: Diagnosis not present

## 2019-05-22 DIAGNOSIS — N319 Neuromuscular dysfunction of bladder, unspecified: Secondary | ICD-10-CM | POA: Diagnosis not present

## 2019-05-22 DIAGNOSIS — E785 Hyperlipidemia, unspecified: Secondary | ICD-10-CM | POA: Diagnosis not present

## 2019-05-22 DIAGNOSIS — Z471 Aftercare following joint replacement surgery: Secondary | ICD-10-CM | POA: Diagnosis not present

## 2019-05-22 DIAGNOSIS — M1712 Unilateral primary osteoarthritis, left knee: Secondary | ICD-10-CM | POA: Diagnosis not present

## 2019-05-22 DIAGNOSIS — K5790 Diverticulosis of intestine, part unspecified, without perforation or abscess without bleeding: Secondary | ICD-10-CM | POA: Diagnosis not present

## 2019-05-22 DIAGNOSIS — Z96651 Presence of right artificial knee joint: Secondary | ICD-10-CM | POA: Diagnosis not present

## 2019-05-22 DIAGNOSIS — K219 Gastro-esophageal reflux disease without esophagitis: Secondary | ICD-10-CM | POA: Diagnosis not present

## 2019-05-25 DIAGNOSIS — E785 Hyperlipidemia, unspecified: Secondary | ICD-10-CM | POA: Diagnosis not present

## 2019-05-25 DIAGNOSIS — K219 Gastro-esophageal reflux disease without esophagitis: Secondary | ICD-10-CM | POA: Diagnosis not present

## 2019-05-25 DIAGNOSIS — Z471 Aftercare following joint replacement surgery: Secondary | ICD-10-CM | POA: Diagnosis not present

## 2019-05-25 DIAGNOSIS — Z96651 Presence of right artificial knee joint: Secondary | ICD-10-CM | POA: Diagnosis not present

## 2019-05-25 DIAGNOSIS — N319 Neuromuscular dysfunction of bladder, unspecified: Secondary | ICD-10-CM | POA: Diagnosis not present

## 2019-05-25 DIAGNOSIS — M1712 Unilateral primary osteoarthritis, left knee: Secondary | ICD-10-CM | POA: Diagnosis not present

## 2019-05-25 DIAGNOSIS — M479 Spondylosis, unspecified: Secondary | ICD-10-CM | POA: Diagnosis not present

## 2019-05-25 DIAGNOSIS — M5137 Other intervertebral disc degeneration, lumbosacral region: Secondary | ICD-10-CM | POA: Diagnosis not present

## 2019-05-25 DIAGNOSIS — I10 Essential (primary) hypertension: Secondary | ICD-10-CM | POA: Diagnosis not present

## 2019-05-25 DIAGNOSIS — K5790 Diverticulosis of intestine, part unspecified, without perforation or abscess without bleeding: Secondary | ICD-10-CM | POA: Diagnosis not present

## 2019-05-27 DIAGNOSIS — K219 Gastro-esophageal reflux disease without esophagitis: Secondary | ICD-10-CM | POA: Diagnosis not present

## 2019-05-27 DIAGNOSIS — Z471 Aftercare following joint replacement surgery: Secondary | ICD-10-CM | POA: Diagnosis not present

## 2019-05-27 DIAGNOSIS — M5137 Other intervertebral disc degeneration, lumbosacral region: Secondary | ICD-10-CM | POA: Diagnosis not present

## 2019-05-27 DIAGNOSIS — I10 Essential (primary) hypertension: Secondary | ICD-10-CM | POA: Diagnosis not present

## 2019-05-27 DIAGNOSIS — M479 Spondylosis, unspecified: Secondary | ICD-10-CM | POA: Diagnosis not present

## 2019-05-27 DIAGNOSIS — N319 Neuromuscular dysfunction of bladder, unspecified: Secondary | ICD-10-CM | POA: Diagnosis not present

## 2019-05-27 DIAGNOSIS — Z96651 Presence of right artificial knee joint: Secondary | ICD-10-CM | POA: Diagnosis not present

## 2019-05-27 DIAGNOSIS — E785 Hyperlipidemia, unspecified: Secondary | ICD-10-CM | POA: Diagnosis not present

## 2019-05-27 DIAGNOSIS — M1712 Unilateral primary osteoarthritis, left knee: Secondary | ICD-10-CM | POA: Diagnosis not present

## 2019-05-27 DIAGNOSIS — K5790 Diverticulosis of intestine, part unspecified, without perforation or abscess without bleeding: Secondary | ICD-10-CM | POA: Diagnosis not present

## 2019-05-29 DIAGNOSIS — M25661 Stiffness of right knee, not elsewhere classified: Secondary | ICD-10-CM | POA: Diagnosis not present

## 2019-06-02 DIAGNOSIS — M25561 Pain in right knee: Secondary | ICD-10-CM | POA: Diagnosis not present

## 2019-06-05 DIAGNOSIS — M25561 Pain in right knee: Secondary | ICD-10-CM | POA: Diagnosis not present

## 2019-06-08 DIAGNOSIS — M25561 Pain in right knee: Secondary | ICD-10-CM | POA: Diagnosis not present

## 2019-06-10 DIAGNOSIS — M25561 Pain in right knee: Secondary | ICD-10-CM | POA: Diagnosis not present

## 2019-06-16 ENCOUNTER — Inpatient Hospital Stay: Payer: Medicare HMO | Attending: Oncology

## 2019-06-16 ENCOUNTER — Inpatient Hospital Stay (HOSPITAL_BASED_OUTPATIENT_CLINIC_OR_DEPARTMENT_OTHER): Payer: Medicare HMO | Admitting: Oncology

## 2019-06-16 ENCOUNTER — Inpatient Hospital Stay: Payer: Medicare HMO | Admitting: Oncology

## 2019-06-16 ENCOUNTER — Other Ambulatory Visit: Payer: Medicare HMO

## 2019-06-16 ENCOUNTER — Encounter: Payer: Self-pay | Admitting: Oncology

## 2019-06-16 ENCOUNTER — Inpatient Hospital Stay: Payer: Medicare HMO

## 2019-06-16 ENCOUNTER — Other Ambulatory Visit: Payer: Self-pay

## 2019-06-16 DIAGNOSIS — D473 Essential (hemorrhagic) thrombocythemia: Secondary | ICD-10-CM

## 2019-06-16 DIAGNOSIS — D75839 Thrombocytosis, unspecified: Secondary | ICD-10-CM

## 2019-06-16 LAB — CBC WITH DIFFERENTIAL/PLATELET
Abs Immature Granulocytes: 0.02 10*3/uL (ref 0.00–0.07)
Basophils Absolute: 0.1 10*3/uL (ref 0.0–0.1)
Basophils Relative: 1 %
Eosinophils Absolute: 0.2 10*3/uL (ref 0.0–0.5)
Eosinophils Relative: 3 %
HCT: 42.2 % (ref 36.0–46.0)
Hemoglobin: 13.4 g/dL (ref 12.0–15.0)
Immature Granulocytes: 0 %
Lymphocytes Relative: 31 %
Lymphs Abs: 2.2 10*3/uL (ref 0.7–4.0)
MCH: 29.2 pg (ref 26.0–34.0)
MCHC: 31.8 g/dL (ref 30.0–36.0)
MCV: 91.9 fL (ref 80.0–100.0)
Monocytes Absolute: 0.7 10*3/uL (ref 0.1–1.0)
Monocytes Relative: 11 %
Neutro Abs: 3.7 10*3/uL (ref 1.7–7.7)
Neutrophils Relative %: 54 %
Platelets: 525 10*3/uL — ABNORMAL HIGH (ref 150–400)
RBC: 4.59 MIL/uL (ref 3.87–5.11)
RDW: 13.5 % (ref 11.5–15.5)
WBC: 6.9 10*3/uL (ref 4.0–10.5)
nRBC: 0 % (ref 0.0–0.2)

## 2019-06-16 NOTE — Progress Notes (Unsigned)
Patient stated that she had been doing well even after having knee replacement surgery on 05/13/2019. Patient stated that she is healing well since she had been doing physical therapy.

## 2019-06-18 ENCOUNTER — Encounter: Payer: Self-pay | Admitting: Oncology

## 2019-06-18 ENCOUNTER — Telehealth: Payer: Medicare HMO | Admitting: Oncology

## 2019-06-18 DIAGNOSIS — M25561 Pain in right knee: Secondary | ICD-10-CM | POA: Diagnosis not present

## 2019-06-18 NOTE — Progress Notes (Signed)
I connected with Caitlin Bennett on 06/18/19 at  2:00 PM EST by video enabled telemedicine visit and verified that I am speaking with the correct person using two identifiers.   I discussed the limitations, risks, security and privacy concerns of performing an evaluation and management service by telemedicine and the availability of in-person appointments. I also discussed with the patient that there may be a patient responsible charge related to this service. The patient expressed understanding and agreed to proceed.  Other persons participating in the visit and their role in the encounter:  none  Patient's location:  home Provider's location:  work  Risk analyst Complaint: Routine follow-up of thrombocytosis  Diagnosis: Thrombocytosis of unclear etiology  History of present illness: Patient is a 74 year old female with a past medical history significant for hypertension, hyperlipidemia referred to [place] for thrombocytosis.Looking back at her CBC over the last 4 years patient has had consistentmild thrombocytosis and her platelet counts have been mostly between 122 430. More recently in September 2019 her platelet count was 451 followed by 498 in January 2020 and 507 in August 2020. She has had a normal white count with a normal differential and a normal hemoglobin as well. No personal history of DVT PE heart attacks or strokes. Overall she feels well and her appetite and weight have been stable. Denies any fatigue or unintentional weight loss or drenching night sweats. Denies any abdominal pain. He does have ongoing back and knee pain for which she sees orthopedics. He has a surgery for her knee is in October 2020.  Jak 2 mutation testing as well as CAL R and MPL mutation testing was negative.  BCR able gene rearrangement was negative.  CMP was normal.  Ferritin and iron studies were normal.  ESR was normal.  Results of blood work from 02/27/2019 were as follows: CBC showed white count of 6.3 with a  normal differential, H&H of 14.9/44.4 and a platelet count of 481.  Interval history patient is recovering well after her knee surgery.  She remains active and is able to ambulate without any significant pain.  She was on prophylactic Lovenox.   Review of Systems  Constitutional: Negative for chills, fever, malaise/fatigue and weight loss.  HENT: Negative for congestion, ear discharge and nosebleeds.   Eyes: Negative for blurred vision.  Respiratory: Negative for cough, hemoptysis, sputum production, shortness of breath and wheezing.   Cardiovascular: Negative for chest pain, palpitations, orthopnea and claudication.  Gastrointestinal: Negative for abdominal pain, blood in stool, constipation, diarrhea, heartburn, melena, nausea and vomiting.  Genitourinary: Negative for dysuria, flank pain, frequency, hematuria and urgency.  Musculoskeletal: Negative for back pain, joint pain and myalgias.  Skin: Negative for rash.  Neurological: Negative for dizziness, tingling, focal weakness, seizures, weakness and headaches.  Endo/Heme/Allergies: Does not bruise/bleed easily.  Psychiatric/Behavioral: Negative for depression and suicidal ideas. The patient does not have insomnia.     Allergies  Allergen Reactions  . Sumycin [Tetracycline Hcl] Rash    Past Medical History:  Diagnosis Date  . Blood in the stool    10 yrs ago  . Diverticulitis   . GERD (gastroesophageal reflux disease)   . History of abnormal Pap smear   . History of chicken pox   . History of colon polyps   . History of kidney stones    h/o   . Hypercholesterolemia   . Hypertension   . Lumbosacral spondylolysis   . Seronegative rheumatoid arthritis [facility])     Past Surgical History:  Procedure  Laterality Date  . COLONOSCOPY    . DILATION AND CURETTAGE OF UTERUS    . KNEE ARTHROPLASTY Right 05/13/2019   Procedure: COMPUTER ASSISTED TOTAL KNEE ARTHROPLASTY RIGHT;  Surgeon: Dereck Leep, MD;  Location: [facility] ORS;   Service: Orthopedics;  Laterality: Right;  . LEG / ANKLE SOFT TISSUE BIOPSY  2011   to confirm arthritis    Social History   Socioeconomic History  . Marital status: Divorced    Spouse name: Not on file  . Number of children: Not on file  . Years of education: Not on file  . Highest education level: Not on file  Occupational History  . Not on file  Social Needs  . Financial resource strain: Not on file  . Food insecurity    Worry: Not on file    Inability: Not on file  . Transportation needs    Medical: Not on file    Non-medical: Not on file  Tobacco Use  . Smoking status: Never Smoker  . Smokeless tobacco: Never Used  Substance and Sexual Activity  . Alcohol use: Yes    Alcohol/week: 0.0 standard drinks    Comment: rare  . Drug use: No  . Sexual activity: Not on file  Lifestyle  . Physical activity    Days per week: Not on file    Minutes per session: Not on file  . Stress: Not on file  Relationships  . Social Herbalist on phone: Not on file    Gets together: Not on file    Attends religious service: Not on file    Active member of club or organization: Not on file    Attends meetings of clubs or organizations: Not on file    Relationship status: Not on file  . Intimate partner violence    Fear of current or ex partner: Not on file    Emotionally abused: Not on file    Physically abused: Not on file    Forced sexual activity: Not on file  Other Topics Concern  . Not on file  Social History Narrative  . Not on file    Family History  Problem Relation Age of Onset  . Arthritis Father   . Hyperlipidemia Father   . Heart disease Father   . Hypertension Father   . Diabetes Father   . Pulmonary fibrosis Father   . Arthritis Mother   . Heart disease Mother   . Hyperlipidemia Mother   . Hypertension Mother   . Colon cancer Maternal Aunt   . Colon cancer Maternal Uncle   . Breast cancer Neg Hx      Current Outpatient Medications:  .   aspirin EC 81 MG tablet, Take 81 mg by mouth daily. Take 2 tablets daily., Disp: , Rfl:  .  calcium carbonate (TUMS EX) 750 MG chewable tablet, Chew 1 tablet by mouth daily., Disp: , Rfl:  .  celecoxib (CELEBREX) 200 MG capsule, Take 1 capsule (200 mg total) by mouth 2 (two) times daily., Disp: 84 capsule, Rfl: 0 .  diphenhydramine-acetaminophen (TYLENOL PM) 25-500 MG TABS tablet, Take 1 tablet by mouth at bedtime., Disp: , Rfl:  .  HYDROcodone-acetaminophen (NORCO/VICODIN) 5-325 MG tablet, Take 1 tablet by mouth every 4 (four) hours as needed., Disp: , Rfl:  .  lisinopril (ZESTRIL) 10 MG tablet, TAKE 1 TABLET DAILY (Patient taking differently: Take 10 mg by mouth every morning. ), Disp: 90 tablet, Rfl: 1 .  Multiple Vitamin (MULTIVITAMIN  WITH MINERALS) TABS tablet, Take 1 tablet by mouth daily., Disp: , Rfl:  .  Omega-3 Fatty Acids (FISH OIL) 1200 MG CAPS, Take 1,200 mg by mouth daily., Disp: , Rfl:  .  oxyCODONE (OXY IR/ROXICODONE) 5 MG immediate release tablet, Take 1 tablet (5 mg total) by mouth every 4 (four) hours as needed for moderate pain (pain score 4-6). (Patient not taking: Reported on 06/16/2019), Disp: 30 tablet, Rfl: 0 .  pantoprazole (PROTONIX) 40 MG tablet, TAKE 1 TABLET DAILY (Patient taking differently: Take 40 mg by mouth every morning. ), Disp: 90 tablet, Rfl: 1 .  phentermine 30 MG capsule, Take 30 mg by mouth daily before breakfast., Disp: , Rfl:  .  Probiotic Product (PROBIOTIC PO), Take 1 capsule by mouth daily., Disp: , Rfl:  .  simvastatin (ZOCOR) 20 MG tablet, TAKE 1 TABLET EVERY EVENING (Patient taking differently: Take 20 mg by mouth every evening. ), Disp: 90 tablet, Rfl: 1  No results found.  No images are attached to the encounter.   CMP Latest Ref Rng & Units 04/29/2019  Glucose 70 - 99 mg/dL 121(H)  BUN 8 - 23 mg/dL 22  Creatinine 0.44 - 1.00 mg/dL 0.68  Sodium 135 - 145 mmol/L 138  Potassium 3.5 - 5.1 mmol/L 3.5  Chloride 98 - 111 mmol/L 104  CO2 22 - 32  mmol/L 26  Calcium 8.9 - 10.3 mg/dL 9.5  Total Protein 6.5 - 8.1 g/dL 7.6  Total Bilirubin 0.3 - 1.2 mg/dL 0.6  Alkaline Phos 38 - 126 U/L 75  AST 15 - 41 U/L 24  ALT 0 - 44 U/L 24   CBC Latest Ref Rng & Units 06/16/2019  WBC 4.0 - 10.5 K/uL 6.9  Hemoglobin 12.0 - 15.0 g/dL 13.4  Hematocrit 36.0 - 46.0 % 42.2  Platelets 150 - 400 K/uL 525(H)     Observation/objective: Appears in no acute distress of a video visit today.  Breathing is nonlabored  Assessment and plan: 73 year old female with history of chronic thrombocytosis of unclear etiology here for routine follow-up  Patient has chronic mild thrombocytosis dating back to at least 2017 and her platelet counts have been in the high 400s to the low 500s.  Her most recent one platelet count was 525.  Peripheral blood work-up so far including JAK2 mutation, CAL R and MPL mutation testing was negative.  There has been no consistent rise in her platelet count.  However she has not had a bone marrow biopsy to rule out a primary myeloproliferative process and the cause of her mild thrombocytosis remains unclear.  Patient would like to hold off on a bone marrow biopsy at this time since she is just recovering from her knee surgery.  We will repeat her CBC in 1 month and 2 months and I will see her back in 2 months.  She will be getting a repeat CBC checked with Dr. Nicki Reaper in 1 month's time  Follow-up instructions: I will see her in 2 months for a video visit  I discussed the assessment and treatment plan with the patient. The patient was provided an opportunity to ask questions and all were answered. The patient agreed with the plan and demonstrated an understanding of the instructions.   The patient was advised to call back or seek an in-person evaluation if the symptoms worsen or if the condition fails to improve as anticipated.    Visit Diagnosis: 1. Thrombocytosis ([place])     Dr. Randa Evens, MD, MPH [facility]  at [facility] [phone] 06/18/2019 3:28 PM

## 2019-06-22 ENCOUNTER — Encounter: Payer: Self-pay | Admitting: Oncology

## 2019-06-23 DIAGNOSIS — M25561 Pain in right knee: Secondary | ICD-10-CM | POA: Diagnosis not present

## 2019-06-23 NOTE — Telephone Encounter (Signed)
Sure we can move her appointment with me to January. Does she want to go ahead with bm bx prior to that or wait to see jan labs and then decide?

## 2019-06-25 DIAGNOSIS — M1711 Unilateral primary osteoarthritis, right knee: Secondary | ICD-10-CM | POA: Diagnosis not present

## 2019-07-02 NOTE — Telephone Encounter (Signed)
Caitlin Bennett can you schedule the bone marrow biopsy and give her the details? I will see her post biopsy

## 2019-07-03 ENCOUNTER — Other Ambulatory Visit: Payer: Self-pay

## 2019-07-03 DIAGNOSIS — D473 Essential (hemorrhagic) thrombocythemia: Secondary | ICD-10-CM

## 2019-07-03 DIAGNOSIS — D75839 Thrombocytosis, unspecified: Secondary | ICD-10-CM

## 2019-07-03 DIAGNOSIS — M25561 Pain in right knee: Secondary | ICD-10-CM | POA: Diagnosis not present

## 2019-07-06 DIAGNOSIS — M25561 Pain in right knee: Secondary | ICD-10-CM | POA: Diagnosis not present

## 2019-07-08 NOTE — Progress Notes (Signed)
Patient on schedule for BMB 07/15/2019, called with preprocedure instructions given, aware to Be NPO after MN prior to procedure as well as be here @ 0730, and driver home after procedure.stated awareness.

## 2019-07-14 ENCOUNTER — Other Ambulatory Visit: Payer: Self-pay | Admitting: Student

## 2019-07-15 ENCOUNTER — Other Ambulatory Visit: Payer: Self-pay

## 2019-07-15 ENCOUNTER — Ambulatory Visit
Admission: RE | Admit: 2019-07-15 | Discharge: 2019-07-15 | Disposition: A | Discharge status: Home / Self Care | Payer: Medicare HMO | Source: Ambulatory Visit | Attending: Oncology | Admitting: Oncology

## 2019-07-15 DIAGNOSIS — D75839 Thrombocytosis, unspecified: Secondary | ICD-10-CM

## 2019-07-15 DIAGNOSIS — Z7982 Long term (current) use of aspirin: Secondary | ICD-10-CM | POA: Insufficient documentation

## 2019-07-15 DIAGNOSIS — Z79899 Other long term (current) drug therapy: Secondary | ICD-10-CM | POA: Insufficient documentation

## 2019-07-15 DIAGNOSIS — Z833 Family history of diabetes mellitus: Secondary | ICD-10-CM | POA: Diagnosis not present

## 2019-07-15 DIAGNOSIS — M4307 Spondylolysis, lumbosacral region: Secondary | ICD-10-CM | POA: Diagnosis not present

## 2019-07-15 DIAGNOSIS — I10 Essential (primary) hypertension: Secondary | ICD-10-CM | POA: Insufficient documentation

## 2019-07-15 DIAGNOSIS — K219 Gastro-esophageal reflux disease without esophagitis: Secondary | ICD-10-CM | POA: Insufficient documentation

## 2019-07-15 DIAGNOSIS — Z8249 Family history of ischemic heart disease and other diseases of the circulatory system: Secondary | ICD-10-CM | POA: Diagnosis not present

## 2019-07-15 DIAGNOSIS — Z8349 Family history of other endocrine, nutritional and metabolic diseases: Secondary | ICD-10-CM | POA: Diagnosis not present

## 2019-07-15 DIAGNOSIS — Z96651 Presence of right artificial knee joint: Secondary | ICD-10-CM | POA: Insufficient documentation

## 2019-07-15 DIAGNOSIS — Z800 Family history of malignant neoplasm of digestive organs: Secondary | ICD-10-CM | POA: Insufficient documentation

## 2019-07-15 DIAGNOSIS — D473 Essential (hemorrhagic) thrombocythemia: Secondary | ICD-10-CM | POA: Diagnosis not present

## 2019-07-15 DIAGNOSIS — M0600 Rheumatoid arthritis without rheumatoid factor, unspecified site: Secondary | ICD-10-CM | POA: Diagnosis not present

## 2019-07-15 DIAGNOSIS — E7800 Pure hypercholesterolemia, unspecified: Secondary | ICD-10-CM | POA: Diagnosis not present

## 2019-07-15 DIAGNOSIS — Z8261 Family history of arthritis: Secondary | ICD-10-CM | POA: Diagnosis not present

## 2019-07-15 DIAGNOSIS — Z881 Allergy status to other antibiotic agents status: Secondary | ICD-10-CM | POA: Diagnosis not present

## 2019-07-15 LAB — CBC WITH DIFFERENTIAL/PLATELET
Abs Immature Granulocytes: 0.02 10*3/uL (ref 0.00–0.07)
Basophils Absolute: 0.1 10*3/uL (ref 0.0–0.1)
Basophils Relative: 1 %
Eosinophils Absolute: 0.3 10*3/uL (ref 0.0–0.5)
Eosinophils Relative: 5 %
HCT: 39.6 % (ref 36.0–46.0)
Hemoglobin: 13.2 g/dL (ref 12.0–15.0)
Immature Granulocytes: 0 %
Lymphocytes Relative: 32 %
Lymphs Abs: 1.9 10*3/uL (ref 0.7–4.0)
MCH: 29.3 pg (ref 26.0–34.0)
MCHC: 33.3 g/dL (ref 30.0–36.0)
MCV: 88 fL (ref 80.0–100.0)
Monocytes Absolute: 0.6 10*3/uL (ref 0.1–1.0)
Monocytes Relative: 10 %
Neutro Abs: 3 10*3/uL (ref 1.7–7.7)
Neutrophils Relative %: 52 %
Platelets: 398 10*3/uL (ref 150–400)
RBC: 4.5 MIL/uL (ref 3.87–5.11)
RDW: 13.2 % (ref 11.5–15.5)
WBC: 5.9 10*3/uL (ref 4.0–10.5)
nRBC: 0 % (ref 0.0–0.2)

## 2019-07-15 LAB — PROTIME-INR
INR: 0.9 (ref 0.8–1.2)
Prothrombin Time: 12 seconds (ref 11.4–15.2)

## 2019-07-15 MED ORDER — MIDAZOLAM HCL 2 MG/2ML IJ SOLN
INTRAMUSCULAR | Status: AC | PRN
  Administered 2019-07-15 (×2): 1 mg via INTRAVENOUS

## 2019-07-15 MED ORDER — SODIUM CHLORIDE 0.9 % IV SOLN: INTRAVENOUS | Status: DC

## 2019-07-15 MED ORDER — HEPARIN SOD (PORK) LOCK FLUSH 100 UNIT/ML IV SOLN
INTRAVENOUS | Status: AC
  Filled 2019-07-15: qty 5

## 2019-07-15 MED ORDER — FENTANYL CITRATE (PF) 100 MCG/2ML IJ SOLN
INTRAMUSCULAR | Status: AC | PRN
  Administered 2019-07-15 (×2): 50 ug via INTRAVENOUS

## 2019-07-15 MED ORDER — FENTANYL CITRATE (PF) 100 MCG/2ML IJ SOLN
INTRAMUSCULAR | Status: AC
  Filled 2019-07-15: qty 2

## 2019-07-15 MED ORDER — MIDAZOLAM HCL 5 MG/5ML IJ SOLN
INTRAMUSCULAR | Status: AC
  Filled 2019-07-15: qty 5

## 2019-07-15 NOTE — Procedures (Signed)
Pre-procedure Diagnosis: Thrombocytosis Post-procedure Diagnosis: Same  Technically successful CT guided bone marrow aspiration and biopsy of left iliac crest.   Complications: None Immediate  EBL: None  Signed: Sandi Mariscal Pager: [phone] 07/15/2019, 9:36 AM

## 2019-07-15 NOTE — Consult Note (Signed)
Chief Complaint: Thrombocytosis  Referring Physician(s): Rao,Archana C  Patient Status: [facility] - Out-pt  History of Present Illness: Caitlin Bennett is a 73 y.o. female with past medical history significant for hypertension, hyperlipidemia, rheumatoid arthritis and nephrolithiasis who presents today for CT-guided bone marrow biopsy for evaluation of thrombocytosis.  The patient is recovering from her total knee replacement which she underwent in October of this year.  Patient is in her baseline state of well health and is without complaint.  Specifically, no chest pain, shortness of breath, fever or chills.    Past Medical History:  Diagnosis Date  . Blood in the stool    10 yrs ago  . Diverticulitis   . GERD (gastroesophageal reflux disease)   . History of abnormal Pap smear   . History of chicken pox   . History of colon polyps   . History of kidney stones    h/o   . Hypercholesterolemia   . Hypertension   . Lumbosacral spondylolysis   . Seronegative rheumatoid arthritis [facility])     Past Surgical History:  Procedure Laterality Date  . COLONOSCOPY    . DILATION AND CURETTAGE OF UTERUS    . KNEE ARTHROPLASTY Right 05/13/2019   Procedure: COMPUTER ASSISTED TOTAL KNEE ARTHROPLASTY RIGHT;  Surgeon: Dereck Leep, MD;  Location: [facility] ORS;  Service: Orthopedics;  Laterality: Right;  . LEG / ANKLE SOFT TISSUE BIOPSY  2011   to confirm arthritis    Allergies: Sumycin [tetracycline hcl]  Medications: Prior to Admission medications   Medication Sig Start Date End Date Taking? Authorizing Provider  aspirin EC 81 MG tablet Take 81 mg by mouth daily. Take 2 tablets daily.   Yes [provider]  calcium carbonate (TUMS EX) 750 MG chewable tablet Chew 1 tablet by mouth daily.   Yes [provider]  celecoxib (CELEBREX) 200 MG capsule Take 1 capsule (200 mg total) by mouth 2 (two) times daily. 05/15/19  Yes Fausto Skillern, PA-C    diphenhydramine-acetaminophen (TYLENOL PM) 25-500 MG TABS tablet Take 1 tablet by mouth at bedtime.   Yes [provider]  lisinopril (ZESTRIL) 10 MG tablet TAKE 1 TABLET DAILY Patient taking differently: Take 10 mg by mouth every morning.  01/19/19  Yes Einar Pheasant, MD  Multiple Vitamin (MULTIVITAMIN WITH MINERALS) TABS tablet Take 1 tablet by mouth daily.   Yes [provider]  Omega-3 Fatty Acids (FISH OIL) 1200 MG CAPS Take 1,200 mg by mouth daily.   Yes [provider]  pantoprazole (PROTONIX) 40 MG tablet TAKE 1 TABLET DAILY Patient taking differently: Take 40 mg by mouth every morning.  03/18/19  Yes Einar Pheasant, MD  Probiotic Product (PROBIOTIC PO) Take 1 capsule by mouth daily.   Yes [provider]  simvastatin (ZOCOR) 20 MG tablet TAKE 1 TABLET EVERY EVENING Patient taking differently: Take 20 mg by mouth every evening.  01/19/19  Yes Einar Pheasant, MD  HYDROcodone-acetaminophen (NORCO/VICODIN) 5-325 MG tablet Take 1 tablet by mouth every 4 (four) hours as needed. 05/26/19   [provider]  oxyCODONE (OXY IR/ROXICODONE) 5 MG immediate release tablet Take 1 tablet (5 mg total) by mouth every 4 (four) hours as needed for moderate pain (pain score 4-6). Patient not taking: Reported on 06/16/2019 05/15/19   Fausto Skillern, PA-C  phentermine 30 MG capsule Take 30 mg by mouth daily before breakfast.    [provider]     Family History  Problem Relation Age of  Onset  . Arthritis Father   . Hyperlipidemia Father   . Heart disease Father   . Hypertension Father   . Diabetes Father   . Pulmonary fibrosis Father   . Arthritis Mother   . Heart disease Mother   . Hyperlipidemia Mother   . Hypertension Mother   . Colon cancer Maternal Aunt   . Colon cancer Maternal Uncle   . Breast cancer Neg Hx     Social History   Socioeconomic History  . Marital status: Divorced    Spouse name: Not on file  . Number of children:  Not on file  . Years of education: Not on file  . Highest education level: Not on file  Occupational History  . Not on file  Tobacco Use  . Smoking status: Never Smoker  . Smokeless tobacco: Never Used  Substance and Sexual Activity  . Alcohol use: Yes    Alcohol/week: 0.0 standard drinks    Comment: rare  . Drug use: No  . Sexual activity: Not on file  Other Topics Concern  . Not on file  Social History Narrative  . Not on file   Social Determinants of Health   Financial Resource Strain:   . Difficulty of Paying Living Expenses: Not on file  Food Insecurity:   . Worried About Charity fundraiser in the Last Year: Not on file  . Ran Out of Food in the Last Year: Not on file  Transportation Needs:   . Lack of Transportation (Medical): Not on file  . Lack of Transportation (Non-Medical): Not on file  Physical Activity:   . Days of Exercise per Week: Not on file  . Minutes of Exercise per Session: Not on file  Stress:   . Feeling of Stress : Not on file  Social Connections:   . Frequency of Communication with Friends and Family: Not on file  . Frequency of Social Gatherings with Friends and Family: Not on file  . Attends Religious Services: Not on file  . Active Member of Clubs or Organizations: Not on file  . Attends Archivist Meetings: Not on file  . Marital Status: Not on file    ECOG Status: 0 - Asymptomatic  Review of Systems: A 12 point ROS discussed and pertinent positives are indicated in the HPI above.  All other systems are negative.  Review of Systems  All other systems reviewed and are negative.   Vital Signs: BP (!) 152/97   Pulse 96   Temp 98.6 F (37 C) (Oral)   Resp 16   Ht '5\' 1"'  (1.549 m)   Wt 73.9 kg   LMP 07/16/1996   BMI 30.80 kg/m   Physical Exam  Imaging: No results found.  Labs:  CBC: Recent Labs    02/16/19 0831 02/27/19 1359 04/29/19 1345 06/16/19 1022  WBC 6.5 6.3 7.0 6.9  HGB 14.7 14.9 15.1* 13.4  HCT  43.1 44.4 45.5 42.2  PLT 507.0* 481* 547* 525*    COAGS: Recent Labs    04/29/19 1345 07/15/19 0750  INR 1.0 0.9  APTT 33  --     BMP: Recent Labs    08/12/18 0856 02/16/19 0831 02/27/19 1359 04/29/19 1345  NA 138 138 138 138  K 4.3 4.3 3.7 3.5  CL 103 105 104 104  CO2 '27 23 25 26  ' GLUCOSE 100* 104* 151* 121*  BUN 21 26* 26* 22  CALCIUM 10.0 9.5 9.3 9.5  CREATININE 0.77 0.72 0.60  0.68  GFRNONAA  --   --  >60 >60  GFRAA  --   --  >60 >60    LIVER FUNCTION TESTS: Recent Labs    08/12/18 0856 02/16/19 0831 02/27/19 1359 04/29/19 1345  BILITOT 0.5 0.5 0.6 0.6  AST '24 19 22 24  ' ALT '26 18 21 24  ' ALKPHOS 73 71 68 75  PROT 7.3 6.7 7.3 7.6  ALBUMIN 4.3 4.1 4.2 4.1    TUMOR MARKERS: No results for input(s): AFPTM, CEA, CA199, CHROMGRNA in the last 8760 hours.  Assessment and Plan:  SOLE LENGACHER is a 73 y.o. female with past medical history significant for hypertension, hyperlipidemia, rheumatoid arthritis and nephrolithiasis who presents today for CT-guided bone marrow biopsy for evaluation of thrombocytosis.  Patient is in her baseline state of well health and is without complaint.    Risks and benefits of CT-guided bone marrow biopsy and aspiration was discussed with the patient and/or patient's family including, but not limited to bleeding, infection, damage to adjacent structures or low yield requiring additional tests.  All of the questions were answered and there is agreement to proceed.  Consent signed and in chart.   Thank you for this interesting consult.  I greatly enjoyed meeting Caitlin Bennett and look forward to participating in their care.  A copy of this report was sent to the requesting provider on this date.  Electronically Signed: Sandi Mariscal, MD 07/15/2019, 8:12 AM   I spent a total of 15 Minutes in face to face in clinical consultation, greater than 50% of which was counseling/coordinating care for CT-guided bone marrow biopsy and  aspiration.

## 2019-07-15 NOTE — Discharge Instructions (Signed)
Bone Marrow Aspiration and Bone Marrow Biopsy, Adult, Care After °This sheet gives you information about how to care for yourself after your procedure. Your health care provider may also give you more specific instructions. If you have problems or questions, contact your health care provider. °What can I expect after the procedure? °After the procedure, it is common to have: °· Mild pain and tenderness. °· Swelling. °· Bruising. °Follow these instructions at home: °Puncture site care ° °  ° °· Follow instructions from your health care provider about how to take care of the puncture site. Make sure you: °? Wash your hands with soap and water before you change your bandage (dressing). If soap and water are not available, use hand sanitizer. °? Change your dressing as told by your health care provider. °· Check your puncture site every day for signs of infection. Check for: °? More redness, swelling, or pain. °? More fluid or blood. °? Warmth. °? Pus or a bad smell. °General instructions °· Take over-the-counter and prescription medicines only as told by your health care provider. °· Do not take baths, swim, or use a hot tub until your health care provider approves. Ask if you can take a shower or have a sponge bath. °· Return to your normal activities as told by your health care provider. Ask your health care provider what activities are safe for you. °· Do not drive for 24 hours if you were given a medicine to help you relax (sedative) during your procedure. °· Keep all follow-up visits as told by your health care provider. This is important. °Contact a health care provider if: °· Your pain is not controlled with medicine. °Get help right away if: °· You have a fever. °· You have more redness, swelling, or pain around the puncture site. °· You have more fluid or blood coming from the puncture site. °· Your puncture site feels warm to the touch. °· You have pus or a bad smell coming from the puncture site. °These  symptoms may represent a serious problem that is an emergency. Do not wait to see if the symptoms will go away. Get medical help right away. Call your local emergency services (911 in the [place] Do not drive yourself to the hospital. °Summary °· After the procedure, it is common to have mild pain, tenderness, swelling, and bruising. °· Follow instructions from your health care provider about how to take care of the puncture site. °· Get help right away if you have any symptoms of infection or if you have more blood or fluid coming from the puncture site. °This information is not intended to replace advice given to you by your health care provider. Make sure you discuss any questions you have with your health care provider. °Document Released: 01/19/2005 Document Revised: 10/15/2017 Document Reviewed: 12/14/2015 °Elsevier Patient Education © 2020 Elsevier Inc[employer] ° ° ° ° °Moderate Conscious Sedation, Adult, Care After °These instructions provide you with information about caring for yourself after your procedure. Your health care provider may also give you more specific instructions. Your treatment has been planned according to current medical practices, but problems sometimes occur. Call your health care provider if you have any problems or questions after your procedure. °What can I expect after the procedure? °After your procedure, it is common: °· To feel sleepy for several hours. °· To feel clumsy and have poor balance for several hours. °· To have poor judgment for several hours. °· To vomit if you eat too soon. °  Follow these instructions at home: °For at least 24 hours after the procedure: ° °· Do not: °? Participate in activities where you could fall or become injured. °? Drive. °? Use heavy machinery. °? Drink alcohol. °? Take sleeping pills or medicines that cause drowsiness. °? Make important decisions or sign legal documents. °? Take care of children on your own. °· Rest. °Eating and drinking °· Follow the  diet recommended by your health care provider. °· If you vomit: °? Drink water, juice, or soup when you can drink without vomiting. °? Make sure you have little or no nausea before eating solid foods. °General instructions °· Have a responsible adult stay with you until you are awake and alert. °· Take over-the-counter and prescription medicines only as told by your health care provider. °· If you smoke, do not smoke without supervision. °· Keep all follow-up visits as told by your health care provider. This is important. °Contact a health care provider if: °· You keep feeling nauseous or you keep vomiting. °· You feel light-headed. °· You develop a rash. °· You have a fever. °Get help right away if: °· You have trouble breathing. °This information is not intended to replace advice given to you by your health care provider. Make sure you discuss any questions you have with your health care provider. °Document Released: 04/22/2013 Document Revised: 06/14/2017 Document Reviewed: 10/22/2015 °Elsevier Patient Education © 2020 Elsevier Inc. ° °

## 2019-07-15 NOTE — Progress Notes (Signed)
Patient clinically stable post BMB per Dr Pascal Lux, tolerated well, awake/alert and oriented post procedure. Received Versed 2mg  along with Fentanyl 181mcg IV for procedure. Taken to specials for post procedure recovery, report given to [employer] with questions answered.

## 2019-07-16 ENCOUNTER — Ambulatory Visit: Payer: Medicare HMO | Admitting: Oncology

## 2019-07-16 ENCOUNTER — Other Ambulatory Visit: Payer: Medicare HMO

## 2019-07-16 ENCOUNTER — Other Ambulatory Visit: Payer: Self-pay

## 2019-07-20 LAB — SURGICAL PATHOLOGY

## 2019-07-22 ENCOUNTER — Encounter (HOSPITAL_COMMUNITY): Payer: Self-pay | Admitting: Oncology

## 2019-07-25 ENCOUNTER — Other Ambulatory Visit: Payer: Self-pay | Admitting: Internal Medicine

## 2019-07-27 ENCOUNTER — Encounter: Payer: Self-pay | Admitting: Oncology

## 2019-07-27 ENCOUNTER — Other Ambulatory Visit: Payer: Self-pay

## 2019-07-27 ENCOUNTER — Inpatient Hospital Stay: Payer: Medicare HMO | Attending: Oncology | Admitting: Oncology

## 2019-07-27 DIAGNOSIS — D75839 Thrombocytosis, unspecified: Secondary | ICD-10-CM

## 2019-07-27 DIAGNOSIS — D473 Essential (hemorrhagic) thrombocythemia: Secondary | ICD-10-CM

## 2019-07-27 NOTE — Progress Notes (Signed)
Patient would like to know about her following appointments.

## 2019-07-29 NOTE — Progress Notes (Signed)
I connected with Caitlin Bennett on 07/29/19 at  1:15 PM EST by video enabled telemedicine visit and verified that I am speaking with the correct person using two identifiers.   I discussed the limitations, risks, security and privacy concerns of performing an evaluation and management service by telemedicine and the availability of in-person appointments. I also discussed with the patient that there may be a patient responsible charge related to this service. The patient expressed understanding and agreed to proceed.  There were problems with connection and visit had to be changed to phone during the encounter  Other persons participating in the visit and their role in the encounter:  none  Patient's location:  home Provider's location:  work  Risk analyst Complaint:  Discuss bone marrow biopsy  History of present illness: Patient is a 74 year old female with a past medical history significant for hypertension, hyperlipidemia referred to [place] for thrombocytosis.Looking back at her CBC over the last 4 years patient has had consistentmild thrombocytosis and her platelet counts have been mostly between 122 430. More recently in September 2019 her platelet count was 451 followed by 498 in January 2020 and 507 in August 2020. She has had a normal white count with a normal differential and a normal hemoglobin as well. No personal history of DVT PE heart attacks or strokes. Overall she feels well and her appetite and weight have been stable. Denies any fatigue or unintentional weight loss or drenching night sweats. Denies any abdominal pain. He does have ongoing back and knee pain for which she sees orthopedics. He has a surgery for her knee is in October 2020.  Jak 2 mutation testing as well as CAL R and MPL mutation testing was negative. BCR able gene rearrangement was negative. CMP was normal. Ferritin and iron studies were normal. ESR was normal. Results of blood work from 02/27/2019 were as follows:  CBC showed white count of 6.3 with a normal differential, H&H of 14.9/44.4 and a platelet count of 481  Bone marrow biopsy showed some evidence of megakaryocytic atypia but not sufficient to diagnose MPN. Reactive process favored.  Interval history Patient is healing well from her recent knee surgery. She denies any complaints at this time   Review of Systems  Constitutional: Negative for chills, fever, malaise/fatigue and weight loss.  HENT: Negative for congestion, ear discharge and nosebleeds.   Eyes: Negative for blurred vision.  Respiratory: Negative for cough, hemoptysis, sputum production, shortness of breath and wheezing.   Cardiovascular: Negative for chest pain, palpitations, orthopnea and claudication.  Gastrointestinal: Negative for abdominal pain, blood in stool, constipation, diarrhea, heartburn, melena, nausea and vomiting.  Genitourinary: Negative for dysuria, flank pain, frequency, hematuria and urgency.  Musculoskeletal: Negative for back pain, joint pain and myalgias.  Skin: Negative for rash.  Neurological: Negative for dizziness, tingling, focal weakness, seizures, weakness and headaches.  Endo/Heme/Allergies: Does not bruise/bleed easily.  Psychiatric/Behavioral: Negative for depression and suicidal ideas. The patient does not have insomnia.     Allergies  Allergen Reactions  . Sumycin [Tetracycline Hcl] Rash    Past Medical History:  Diagnosis Date  . Blood in the stool    10 yrs ago  . Diverticulitis   . GERD (gastroesophageal reflux disease)   . History of abnormal Pap smear   . History of chicken pox   . History of colon polyps   . History of kidney stones    h/o   . Hypercholesterolemia   . Hypertension   . Lumbosacral spondylolysis   .  Seronegative rheumatoid arthritis [facility])     Past Surgical History:  Procedure Laterality Date  . COLONOSCOPY    . DILATION AND CURETTAGE OF UTERUS    . KNEE ARTHROPLASTY Right 05/13/2019   Procedure: COMPUTER  ASSISTED TOTAL KNEE ARTHROPLASTY RIGHT;  Surgeon: Dereck Leep, MD;  Location: [facility] ORS;  Service: Orthopedics;  Laterality: Right;  . LEG / ANKLE SOFT TISSUE BIOPSY  2011   to confirm arthritis    Social History   Socioeconomic History  . Marital status: Divorced    Spouse name: Not on file  . Number of children: Not on file  . Years of education: Not on file  . Highest education level: Not on file  Occupational History  . Not on file  Tobacco Use  . Smoking status: Never Smoker  . Smokeless tobacco: Never Used  Substance and Sexual Activity  . Alcohol use: Yes    Alcohol/week: 0.0 standard drinks    Comment: rare  . Drug use: No  . Sexual activity: Not on file  Other Topics Concern  . Not on file  Social History Narrative  . Not on file   Social Determinants of Health   Financial Resource Strain:   . Difficulty of Paying Living Expenses: Not on file  Food Insecurity:   . Worried About Charity fundraiser in the Last Year: Not on file  . Ran Out of Food in the Last Year: Not on file  Transportation Needs:   . Lack of Transportation (Medical): Not on file  . Lack of Transportation (Non-Medical): Not on file  Physical Activity:   . Days of Exercise per Week: Not on file  . Minutes of Exercise per Session: Not on file  Stress:   . Feeling of Stress : Not on file  Social Connections:   . Frequency of Communication with Friends and Family: Not on file  . Frequency of Social Gatherings with Friends and Family: Not on file  . Attends Religious Services: Not on file  . Active Member of Clubs or Organizations: Not on file  . Attends Archivist Meetings: Not on file  . Marital Status: Not on file  Intimate Partner Violence:   . Fear of Current or Ex-Partner: Not on file  . Emotionally Abused: Not on file  . Physically Abused: Not on file  . Sexually Abused: Not on file    Family History  Problem Relation Age of Onset  . Arthritis Father   .  Hyperlipidemia Father   . Heart disease Father   . Hypertension Father   . Diabetes Father   . Pulmonary fibrosis Father   . Arthritis Mother   . Heart disease Mother   . Hyperlipidemia Mother   . Hypertension Mother   . Colon cancer Maternal Aunt   . Colon cancer Maternal Uncle   . Breast cancer Neg Hx      Current Outpatient Medications:  .  aspirin EC 81 MG tablet, Take 81 mg by mouth daily. Take 2 tablets daily., Disp: , Rfl:  .  calcium carbonate (TUMS EX) 750 MG chewable tablet, Chew 1 tablet by mouth daily., Disp: , Rfl:  .  celecoxib (CELEBREX) 200 MG capsule, Take 1 capsule (200 mg total) by mouth 2 (two) times daily., Disp: 84 capsule, Rfl: 0 .  diphenhydramine-acetaminophen (TYLENOL PM) 25-500 MG TABS tablet, Take 1 tablet by mouth at bedtime., Disp: , Rfl:  .  Multiple Vitamin (MULTIVITAMIN WITH MINERALS) TABS tablet, Take 1  tablet by mouth daily., Disp: , Rfl:  .  Omega-3 Fatty Acids (FISH OIL) 1200 MG CAPS, Take 1,200 mg by mouth daily., Disp: , Rfl:  .  pantoprazole (PROTONIX) 40 MG tablet, TAKE 1 TABLET DAILY (Patient taking differently: Take 40 mg by mouth every morning. ), Disp: 90 tablet, Rfl: 1 .  phentermine 30 MG capsule, Take 30 mg by mouth daily before breakfast., Disp: , Rfl:  .  Probiotic Product (PROBIOTIC PO), Take 1 capsule by mouth daily., Disp: , Rfl:  .  lisinopril (ZESTRIL) 10 MG tablet, TAKE 1 TABLET DAILY, Disp: 90 tablet, Rfl: 1 .  simvastatin (ZOCOR) 20 MG tablet, TAKE 1 TABLET EVERY EVENING, Disp: 90 tablet, Rfl: 1  CT BONE MARROW BIOPSY & ASPIRATION  Result Date: 07/15/2019 INDICATION: Thrombocytosis of uncertain etiology. Please perform CT-guided bone marrow biopsy for tissue diagnostic purposes. EXAM: CT-GUIDED BONE MARROW BIOPSY AND ASPIRATION MEDICATIONS: None ANESTHESIA/SEDATION: Fentanyl 100 mcg IV; Versed 2 mg IV Sedation Time: 11 Minutes; The patient was continuously monitored during the procedure by the interventional radiology nurse  under my direct supervision. COMPLICATIONS: None immediate. PROCEDURE: Informed consent was obtained from the patient following an explanation of the procedure, risks, benefits and alternatives. The patient understands, agrees and consents for the procedure. All questions were addressed. A time out was performed prior to the initiation of the procedure. The patient was positioned prone and non-contrast localization CT was performed of the pelvis to demonstrate the iliac marrow spaces. The operative site was prepped and draped in the usual sterile fashion. Under sterile conditions and local anesthesia, a 22 gauge spinal needle was utilized for procedural planning. Next, an 11 gauge coaxial bone biopsy needle was advanced into the left iliac marrow space. Needle position was confirmed with CT imaging. Initially, bone marrow aspiration was performed. Next, a bone marrow biopsy was obtained with the 11 gauge outer bone marrow device. The 11 gauge coaxial bone biopsy needle was re-advanced into a slightly different location within the left iliac marrow space, positioning was confirmed and an additional bone marrow biopsy was obtained. The needle was removed intact. Superficial hemostasis was obtained with compression and a dressing was placed. The patient tolerated the procedure well without immediate post procedural complication. IMPRESSION: Successful CT guided left iliac bone marrow aspiration and core biopsy. Electronically Signed   By: Sandi Mariscal M.D.   On: 07/15/2019 09:51    No images are attached to the encounter.   CMP Latest Ref Rng & Units 04/29/2019  Glucose 70 - 99 mg/dL 121(H)  BUN 8 - 23 mg/dL 22  Creatinine 0.44 - 1.00 mg/dL 0.68  Sodium 135 - 145 mmol/L 138  Potassium 3.5 - 5.1 mmol/L 3.5  Chloride 98 - 111 mmol/L 104  CO2 22 - 32 mmol/L 26  Calcium 8.9 - 10.3 mg/dL 9.5  Total Protein 6.5 - 8.1 g/dL 7.6  Total Bilirubin 0.3 - 1.2 mg/dL 0.6  Alkaline Phos 38 - 126 U/L 75  AST 15 - 41  U/L 24  ALT 0 - 44 U/L 24   CBC Latest Ref Rng & Units 07/15/2019  WBC 4.0 - 10.5 K/uL 5.9  Hemoglobin 12.0 - 15.0 g/dL 13.2  Hematocrit 36.0 - 46.0 % 39.6  Platelets 150 - 400 K/uL 398    Assessment and plan: Patient is a 74 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 JAK2, CALR and MPL negative. platelet counts fluctuate between  300's- 500's.   Continue to monitor platelet count every 6 months and I will see her for video visit   Follow-up instructions: as above  I discussed the assessment and treatment plan with the patient. The patient was provided an opportunity to ask questions and all were answered. The patient agreed with the plan and demonstrated an understanding of the instructions.   The patient was advised to call back or seek an in-person evaluation if the symptoms worsen or if the condition fails to improve as anticipated.   Visit Diagnosis: 1. Thrombocytosis ([place])     Dr. Randa Evens, MD, MPH [facility] Tel- [phone] 07/29/2019 9:25 AM

## 2019-08-17 ENCOUNTER — Other Ambulatory Visit: Payer: Medicare HMO

## 2019-08-18 ENCOUNTER — Ambulatory Visit: Payer: Medicare HMO | Admitting: Oncology

## 2019-08-21 ENCOUNTER — Encounter: Payer: Medicare HMO | Admitting: Internal Medicine

## 2019-08-24 ENCOUNTER — Other Ambulatory Visit: Payer: Medicare HMO

## 2019-08-25 ENCOUNTER — Ambulatory Visit: Payer: Medicare HMO | Admitting: Oncology

## 2019-09-04 ENCOUNTER — Other Ambulatory Visit: Payer: Medicare HMO

## 2019-09-07 ENCOUNTER — Encounter: Payer: Medicare HMO | Admitting: Internal Medicine

## 2019-09-11 ENCOUNTER — Other Ambulatory Visit (INDEPENDENT_AMBULATORY_CARE_PROVIDER_SITE_OTHER): Payer: Medicare HMO

## 2019-09-11 ENCOUNTER — Other Ambulatory Visit: Payer: Self-pay

## 2019-09-11 DIAGNOSIS — E785 Hyperlipidemia, unspecified: Secondary | ICD-10-CM | POA: Diagnosis not present

## 2019-09-11 DIAGNOSIS — I10 Essential (primary) hypertension: Secondary | ICD-10-CM

## 2019-09-11 DIAGNOSIS — R739 Hyperglycemia, unspecified: Secondary | ICD-10-CM

## 2019-09-11 LAB — CBC WITH DIFFERENTIAL/PLATELET
Basophils Absolute: 0.1 10*3/uL (ref 0.0–0.1)
Basophils Relative: 1.1 % (ref 0.0–3.0)
Eosinophils Absolute: 0.2 10*3/uL (ref 0.0–0.7)
Eosinophils Relative: 3.8 % (ref 0.0–5.0)
HCT: 41.2 % (ref 36.0–46.0)
Hemoglobin: 13.5 g/dL (ref 12.0–15.0)
Lymphocytes Relative: 33.6 % (ref 12.0–46.0)
Lymphs Abs: 1.9 10*3/uL (ref 0.7–4.0)
MCHC: 32.8 g/dL (ref 30.0–36.0)
MCV: 87.4 fl (ref 78.0–100.0)
Monocytes Absolute: 0.5 10*3/uL (ref 0.1–1.0)
Monocytes Relative: 8 % (ref 3.0–12.0)
Neutro Abs: 3.1 10*3/uL (ref 1.4–7.7)
Neutrophils Relative %: 53.5 % (ref 43.0–77.0)
Platelets: 411 10*3/uL — ABNORMAL HIGH (ref 150.0–400.0)
RBC: 4.71 Mil/uL (ref 3.87–5.11)
RDW: 13.9 % (ref 11.5–15.5)
WBC: 5.8 10*3/uL (ref 4.0–10.5)

## 2019-09-11 LAB — HEPATIC FUNCTION PANEL
ALT: 17 U/L (ref 0–35)
AST: 20 U/L (ref 0–37)
Albumin: 4 g/dL (ref 3.5–5.2)
Alkaline Phosphatase: 79 U/L (ref 39–117)
Bilirubin, Direct: 0.1 mg/dL (ref 0.0–0.3)
Total Bilirubin: 0.4 mg/dL (ref 0.2–1.2)
Total Protein: 7.4 g/dL (ref 6.0–8.3)

## 2019-09-11 LAB — LIPID PANEL
Cholesterol: 140 mg/dL (ref 0–200)
HDL: 37.1 mg/dL — ABNORMAL LOW (ref >39.00)
LDL Cholesterol: 81 mg/dL (ref 0–99)
NonHDL: 103.3
Total CHOL/HDL Ratio: 4
Triglycerides: 111 mg/dL (ref 0.0–149.0)
VLDL: 22.2 mg/dL (ref 0.0–40.0)

## 2019-09-11 LAB — TSH: TSH: 2 u[IU]/mL (ref 0.35–4.50)

## 2019-09-11 LAB — BASIC METABOLIC PANEL
BUN: 28 mg/dL — ABNORMAL HIGH (ref 6–23)
CO2: 24 mEq/L (ref 19–32)
Calcium: 9.6 mg/dL (ref 8.4–10.5)
Chloride: 108 mEq/L (ref 96–112)
Creatinine, Ser: 0.75 mg/dL (ref 0.40–1.20)
GFR: 75.33 mL/min (ref >60.00)
Glucose, Bld: 106 mg/dL — ABNORMAL HIGH (ref 70–99)
Potassium: 4 mEq/L (ref 3.5–5.1)
Sodium: 139 mEq/L (ref 135–145)

## 2019-09-11 LAB — HEMOGLOBIN A1C: Hgb A1c MFr Bld: 6.1 % (ref 4.6–6.5)

## 2019-09-12 ENCOUNTER — Other Ambulatory Visit: Payer: Self-pay | Admitting: Internal Medicine

## 2019-09-12 ENCOUNTER — Encounter: Payer: Self-pay | Admitting: Internal Medicine

## 2019-09-14 ENCOUNTER — Encounter: Payer: Medicare HMO | Admitting: Internal Medicine

## 2019-09-15 DIAGNOSIS — H25013 Cortical age-related cataract, bilateral: Secondary | ICD-10-CM | POA: Diagnosis not present

## 2019-09-15 DIAGNOSIS — H25043 Posterior subcapsular polar age-related cataract, bilateral: Secondary | ICD-10-CM | POA: Diagnosis not present

## 2019-09-15 DIAGNOSIS — H5212 Myopia, left eye: Secondary | ICD-10-CM | POA: Diagnosis not present

## 2019-09-15 DIAGNOSIS — H2513 Age-related nuclear cataract, bilateral: Secondary | ICD-10-CM | POA: Diagnosis not present

## 2019-09-17 ENCOUNTER — Ambulatory Visit (INDEPENDENT_AMBULATORY_CARE_PROVIDER_SITE_OTHER): Payer: Medicare HMO | Admitting: Internal Medicine

## 2019-09-17 ENCOUNTER — Other Ambulatory Visit: Payer: Self-pay

## 2019-09-17 ENCOUNTER — Encounter: Payer: Self-pay | Admitting: Internal Medicine

## 2019-09-17 VITALS — BP 118/78 | HR 81 | Temp 97.2°F | Resp 16 | Ht 61.0 in | Wt 170.4 lb

## 2019-09-17 DIAGNOSIS — E785 Hyperlipidemia, unspecified: Secondary | ICD-10-CM | POA: Diagnosis not present

## 2019-09-17 DIAGNOSIS — I10 Essential (primary) hypertension: Secondary | ICD-10-CM

## 2019-09-17 DIAGNOSIS — D473 Essential (hemorrhagic) thrombocythemia: Secondary | ICD-10-CM

## 2019-09-17 DIAGNOSIS — Z96651 Presence of right artificial knee joint: Secondary | ICD-10-CM

## 2019-09-17 DIAGNOSIS — Z0000 Encounter for general adult medical examination without abnormal findings: Secondary | ICD-10-CM | POA: Diagnosis not present

## 2019-09-17 DIAGNOSIS — D75839 Thrombocytosis, unspecified: Secondary | ICD-10-CM

## 2019-09-17 DIAGNOSIS — R739 Hyperglycemia, unspecified: Secondary | ICD-10-CM

## 2019-09-17 DIAGNOSIS — R42 Dizziness and giddiness: Secondary | ICD-10-CM

## 2019-09-17 NOTE — Progress Notes (Signed)
[name], female   DOB: 02/20/1946, 74 y.o.   MRN: [id number]   Subjective:    [name], female    DOB: [birth year], 74 y.o.   MRN: [id number]  HPI  Patient here for a physical exam.  She reports she is doing relatively well.  S/p right TKA ([place]).  Last evaluated 07/2019.  Doing well.  Recommended f/u in 9 months.  Has been having persistent problems with dizziness - noticed when rolling over in bed.  Describes room spinning when rolls over.  Does have a history of vertigo.  Blood pressure has been doing well - averaging <120/70.  No chest pain or sob reported.  No abdominal pain or bowel change reported.  Seeing hematology for elevated platelets.  S/p bone marrow biopsy.  Following.  Handling stress.     Past Medical History:  Diagnosis Date  . Blood in the stool    10 yrs ago  . Diverticulitis   . GERD (gastroesophageal reflux disease)   . History of abnormal Pap smear   . History of chicken pox   . History of colon polyps   . History of kidney stones    h/o   . Hypercholesterolemia   . Hypertension   . Lumbosacral spondylolysis   . Seronegative rheumatoid arthritis [facility])    Past Surgical History:  Procedure Laterality Date  . COLONOSCOPY    . DILATION AND CURETTAGE OF UTERUS    . KNEE ARTHROPLASTY Right 05/13/2019   Procedure: COMPUTER ASSISTED TOTAL KNEE ARTHROPLASTY RIGHT;  Surgeon: Dereck Leep, MD;  Location: [facility] ORS;  Service: Orthopedics;  Laterality: Right;  . LEG / ANKLE SOFT TISSUE BIOPSY  2011   to confirm arthritis   Family History  Problem Relation Age of Onset  . Arthritis Father   . Hyperlipidemia Father   . Heart disease Father   . Hypertension Father   . Diabetes Father   . Pulmonary fibrosis Father   . Arthritis Mother   . Heart disease Mother   . Hyperlipidemia Mother   . Hypertension Mother   . Colon cancer Maternal Aunt   . Colon cancer Maternal Uncle   . Breast cancer Neg Hx    Social History    Socioeconomic History  . Marital status: Divorced    Spouse name: Not on file  . Number of children: Not on file  . Years of education: Not on file  . Highest education level: Not on file  Occupational History  . Not on file  Tobacco Use  . Smoking status: Never Smoker  . Smokeless tobacco: Never Used  Substance and Sexual Activity  . Alcohol use: Yes    Alcohol/week: 0.0 standard drinks    Comment: rare  . Drug use: No  . Sexual activity: Not on file  Other Topics Concern  . Not on file  Social History Narrative  . Not on file   Social Determinants of Health   Financial Resource Strain:   . Difficulty of Paying Living Expenses:   Food Insecurity:   . Worried About Charity fundraiser in the Last Year:   . Arboriculturist in the Last Year:   Transportation Needs:   . Film/video editor (Medical):   Marland Kitchen Lack of Transportation (Non-Medical):   Physical Activity:   . Days of Exercise per Week:   . Minutes of Exercise per Session:   Stress:   . Feeling of Stress :  Social Connections:   . Frequency of Communication with Friends and Family:   . Frequency of Social Gatherings with Friends and Family:   . Attends Religious Services:   . Active Member of Clubs or Organizations:   . Attends Archivist Meetings:   Marland Kitchen Marital Status:     Outpatient Encounter Medications as of 09/17/2019  Medication Sig  . aspirin EC 81 MG tablet Take 81 mg by mouth daily. Take 2 tablets daily.  . calcium carbonate (TUMS EX) 750 MG chewable tablet Chew 1 tablet by mouth daily.  . celecoxib (CELEBREX) 200 MG capsule Take 1 capsule (200 mg total) by mouth 2 (two) times daily.  . diphenhydramine-acetaminophen (TYLENOL PM) 25-500 MG TABS tablet Take 1 tablet by mouth at bedtime.  Marland Kitchen lisinopril (ZESTRIL) 10 MG tablet TAKE 1 TABLET DAILY  . Multiple Vitamin (MULTIVITAMIN WITH MINERALS) TABS tablet Take 1 tablet by mouth daily.  . Omega-3 Fatty Acids (FISH OIL) 1200 MG CAPS Take 1,200  mg by mouth daily.  . pantoprazole (PROTONIX) 40 MG tablet TAKE 1 TABLET DAILY  . phentermine 30 MG capsule Take 30 mg by mouth daily before breakfast.  . Probiotic Product (PROBIOTIC PO) Take 1 capsule by mouth daily.  . simvastatin (ZOCOR) 20 MG tablet TAKE 1 TABLET EVERY EVENING   No facility-administered encounter medications on file as of 09/17/2019.   Review of Systems  Constitutional: Negative for appetite change and unexpected weight change.  HENT: Negative for congestion and sinus pressure.   Eyes: Negative for pain and visual disturbance.  Respiratory: Negative for cough, chest tightness and shortness of breath.   Cardiovascular: Negative for chest pain, palpitations and leg swelling.  Gastrointestinal: Negative for abdominal pain, diarrhea, nausea and vomiting.  Genitourinary: Negative for difficulty urinating and dysuria.  Musculoskeletal: Negative for joint swelling and myalgias.  Skin: Negative for color change and rash.  Neurological: Positive for dizziness. Negative for headaches.  Hematological: Negative for adenopathy. Does not bruise/bleed easily.  Psychiatric/Behavioral: Negative for agitation and dysphoric mood.       Objective:    Physical Exam Constitutional:      General: She is not in acute distress.    Appearance: Normal appearance. She is well-developed.  HENT:     Head: Normocephalic and atraumatic.     Right Ear: External ear normal.     Left Ear: External ear normal.  Eyes:     General: No scleral icterus.       Right eye: No discharge.        Left eye: No discharge.     Conjunctiva/sclera: Conjunctivae normal.  Neck:     Thyroid: No thyromegaly.  Cardiovascular:     Rate and Rhythm: Normal rate and regular rhythm.  Pulmonary:     Effort: No tachypnea, accessory muscle usage or respiratory distress.     Breath sounds: Normal breath sounds. No decreased breath sounds or wheezing.  Chest:     Breasts:        Right: No inverted nipple, mass,  nipple discharge or tenderness (no axillary adenopathy).        Left: No inverted nipple, mass, nipple discharge or tenderness (no axilarry adenopathy).  Abdominal:     General: Bowel sounds are normal.     Palpations: Abdomen is soft.     Tenderness: There is no abdominal tenderness.  Musculoskeletal:        General: No swelling or tenderness.     Cervical back: Neck supple. No tenderness.  Lymphadenopathy:     Cervical: No cervical adenopathy.  Skin:    Findings: No erythema or rash.  Neurological:     Mental Status: She is alert and oriented to person, place, and time.  Psychiatric:        Mood and Affect: Mood normal.        Behavior: Behavior normal.     BP 118/78   Pulse 81   Temp (!) 97.2 F (36.2 C)   Resp 16   Ht '5\' 1"'  (1.549 m)   Wt 170 lb 6.4 oz (77.3 kg)   LMP 07/16/1996   SpO2 97%   BMI 32.20 kg/m  Wt Readings from Last 3 Encounters:  09/17/19 170 lb 6.4 oz (77.3 kg)  07/15/19 163 lb (73.9 kg)  05/13/19 159 lb (72.1 kg)     Lab Results  Component Value Date   WBC 5.8 09/11/2019   HGB 13.5 09/11/2019   HCT 41.2 09/11/2019   PLT 411.0 (H) 09/11/2019   GLUCOSE 106 (H) 09/11/2019   CHOL 140 09/11/2019   TRIG 111.0 09/11/2019   HDL 37.10 (L) 09/11/2019   LDLDIRECT 153.7 11/15/2010   LDLCALC 81 09/11/2019   ALT 17 09/11/2019   AST 20 09/11/2019   NA 139 09/11/2019   K 4.0 09/11/2019   CL 108 09/11/2019   CREATININE 0.75 09/11/2019   BUN 28 (H) 09/11/2019   CO2 24 09/11/2019   TSH 2.00 09/11/2019   INR 0.9 07/15/2019   HGBA1C 6.1 09/11/2019    CT BONE MARROW BIOPSY & ASPIRATION  Result Date: 07/15/2019 INDICATION: Thrombocytosis of uncertain etiology. Please perform CT-guided bone marrow biopsy for tissue diagnostic purposes. EXAM: CT-GUIDED BONE MARROW BIOPSY AND ASPIRATION MEDICATIONS: None ANESTHESIA/SEDATION: Fentanyl 100 mcg IV; Versed 2 mg IV Sedation Time: 11 Minutes; The patient was continuously monitored during the procedure by the  interventional radiology nurse under my direct supervision. COMPLICATIONS: None immediate. PROCEDURE: Informed consent was obtained from the patient following an explanation of the procedure, risks, benefits and alternatives. The patient understands, agrees and consents for the procedure. All questions were addressed. A time out was performed prior to the initiation of the procedure. The patient was positioned prone and non-contrast localization CT was performed of the pelvis to demonstrate the iliac marrow spaces. The operative site was prepped and draped in the usual sterile fashion. Under sterile conditions and local anesthesia, a 22 gauge spinal needle was utilized for procedural planning. Next, an 11 gauge coaxial bone biopsy needle was advanced into the left iliac marrow space. Needle position was confirmed with CT imaging. Initially, bone marrow aspiration was performed. Next, a bone marrow biopsy was obtained with the 11 gauge outer bone marrow device. The 11 gauge coaxial bone biopsy needle was re-advanced into a slightly different location within the left iliac marrow space, positioning was confirmed and an additional bone marrow biopsy was obtained. The needle was removed intact. Superficial hemostasis was obtained with compression and a dressing was placed. The patient tolerated the procedure well without immediate post procedural complication. IMPRESSION: Successful CT guided left iliac bone marrow aspiration and core biopsy. Electronically Signed   By: Sandi Mariscal M.D.   On: 07/15/2019 09:51       Assessment & Plan:   Problem List Items Addressed This Visit    Dizziness    Describes room spinning when rolls over in bed. Persistent.  Has history of vertigo.  Reproducible on exam.  Refer to ENT.  Relevant Orders   Ambulatory referral to ENT   Health care maintenance    Physical today 09/17/19.  Mammogram 12/31/18 - Birads I.  Colonoscopy 2010 - due.  Notify me when agreeable.        Hyperglycemia    Low carb diet and exercise.  Follow met b and a1c.       Relevant Orders   Hemoglobin A1c   Hyperlipidemia    On simvastatin.  Low cholesterol diet and exercise.  Follow lipid panel and liver function tests.        Relevant Orders   Hepatic function panel   Lipid panel   Hypertension    Blood pressure under good control.  Continue same medication regimen - lisinopril.   Follow pressures.  Follow metabolic panel.        Relevant Orders   Basic metabolic panel   Thrombocytosis ([place])    Saw hematology.  S/p bone marrow biopsy.  Following.        Total knee replacement status    Just saw Dr Marry Guan.  Stable.  Doing well.  Follow.            Einar Pheasant, MD

## 2019-09-21 ENCOUNTER — Other Ambulatory Visit: Payer: Self-pay | Admitting: Internal Medicine

## 2019-09-21 DIAGNOSIS — Z1231 Encounter for screening mammogram for malignant neoplasm of breast: Secondary | ICD-10-CM

## 2019-09-26 ENCOUNTER — Encounter: Payer: Self-pay | Admitting: Internal Medicine

## 2019-09-26 DIAGNOSIS — R42 Dizziness and giddiness: Secondary | ICD-10-CM | POA: Insufficient documentation

## 2019-09-26 NOTE — Assessment & Plan Note (Signed)
Saw hematology.  S/p bone marrow biopsy.  Following.

## 2019-09-26 NOTE — Assessment & Plan Note (Signed)
Physical today 09/17/19.  Mammogram 12/31/18 - Birads I.  Colonoscopy 2010 - due.  Notify me when agreeable.

## 2019-09-26 NOTE — Assessment & Plan Note (Signed)
Low carb diet and exercise.  Follow met b and a1c.  

## 2019-09-26 NOTE — Assessment & Plan Note (Signed)
On simvastatin.  Low cholesterol diet and exercise.  Follow lipid panel and liver function tests.   

## 2019-09-26 NOTE — Assessment & Plan Note (Signed)
Blood pressure under good control.  Continue same medication regimen - lisinopril.   Follow pressures.  Follow metabolic panel.

## 2019-09-26 NOTE — Assessment & Plan Note (Signed)
Describes room spinning when rolls over in bed. Persistent.  Has history of vertigo.  Reproducible on exam.  Refer to ENT.

## 2019-09-26 NOTE — Assessment & Plan Note (Signed)
Just saw Dr Marry Guan.  Stable.  Doing well.  Follow.

## 2019-10-02 DIAGNOSIS — H8111 Benign paroxysmal vertigo, right ear: Secondary | ICD-10-CM | POA: Diagnosis not present

## 2019-10-13 DIAGNOSIS — H8111 Benign paroxysmal vertigo, right ear: Secondary | ICD-10-CM | POA: Diagnosis not present

## 2019-11-30 DIAGNOSIS — R69 Illness, unspecified: Secondary | ICD-10-CM | POA: Diagnosis not present

## 2020-01-01 ENCOUNTER — Ambulatory Visit
Admission: RE | Admit: 2020-01-01 | Discharge: 2020-01-01 | Disposition: A | Discharge status: Home / Self Care | Payer: Medicare HMO | Source: Ambulatory Visit | Attending: Internal Medicine | Admitting: Internal Medicine

## 2020-01-01 DIAGNOSIS — Z1231 Encounter for screening mammogram for malignant neoplasm of breast: Secondary | ICD-10-CM | POA: Insufficient documentation

## 2020-01-07 ENCOUNTER — Other Ambulatory Visit: Payer: Self-pay | Admitting: Internal Medicine

## 2020-01-19 ENCOUNTER — Other Ambulatory Visit (INDEPENDENT_AMBULATORY_CARE_PROVIDER_SITE_OTHER): Payer: Medicare HMO

## 2020-01-19 ENCOUNTER — Other Ambulatory Visit: Payer: Self-pay

## 2020-01-19 DIAGNOSIS — R739 Hyperglycemia, unspecified: Secondary | ICD-10-CM | POA: Diagnosis not present

## 2020-01-19 DIAGNOSIS — I10 Essential (primary) hypertension: Secondary | ICD-10-CM | POA: Diagnosis not present

## 2020-01-19 DIAGNOSIS — E785 Hyperlipidemia, unspecified: Secondary | ICD-10-CM

## 2020-01-19 LAB — HEPATIC FUNCTION PANEL
ALT: 20 U/L (ref 0–35)
AST: 21 U/L (ref 0–37)
Albumin: 4.3 g/dL (ref 3.5–5.2)
Alkaline Phosphatase: 74 U/L (ref 39–117)
Bilirubin, Direct: 0.1 mg/dL (ref 0.0–0.3)
Total Bilirubin: 0.5 mg/dL (ref 0.2–1.2)
Total Protein: 7.3 g/dL (ref 6.0–8.3)

## 2020-01-19 LAB — BASIC METABOLIC PANEL
BUN: 22 mg/dL (ref 6–23)
CO2: 24 mEq/L (ref 19–32)
Calcium: 9.6 mg/dL (ref 8.4–10.5)
Chloride: 107 mEq/L (ref 96–112)
Creatinine, Ser: 0.85 mg/dL (ref 0.40–1.20)
GFR: 65.14 mL/min (ref >60.00)
Glucose, Bld: 112 mg/dL — ABNORMAL HIGH (ref 70–99)
Potassium: 3.9 mEq/L (ref 3.5–5.1)
Sodium: 141 mEq/L (ref 135–145)

## 2020-01-19 LAB — LIPID PANEL
Cholesterol: 150 mg/dL (ref 0–200)
HDL: 35.4 mg/dL — ABNORMAL LOW (ref >39.00)
LDL Cholesterol: 74 mg/dL (ref 0–99)
NonHDL: 114.43
Total CHOL/HDL Ratio: 4
Triglycerides: 200 mg/dL — ABNORMAL HIGH (ref 0.0–149.0)
VLDL: 40 mg/dL (ref 0.0–40.0)

## 2020-01-19 LAB — HEMOGLOBIN A1C: Hgb A1c MFr Bld: 6 % (ref 4.6–6.5)

## 2020-01-20 ENCOUNTER — Encounter: Payer: Self-pay | Admitting: Internal Medicine

## 2020-01-20 ENCOUNTER — Other Ambulatory Visit (INDEPENDENT_AMBULATORY_CARE_PROVIDER_SITE_OTHER): Payer: Medicare HMO

## 2020-01-20 DIAGNOSIS — D473 Essential (hemorrhagic) thrombocythemia: Secondary | ICD-10-CM | POA: Diagnosis not present

## 2020-01-20 DIAGNOSIS — D75839 Thrombocytosis, unspecified: Secondary | ICD-10-CM

## 2020-01-20 LAB — CBC WITH DIFFERENTIAL/PLATELET
Basophils Absolute: 0.1 10*3/uL (ref 0.0–0.1)
Basophils Relative: 1.6 % (ref 0.0–3.0)
Eosinophils Absolute: 0.3 10*3/uL (ref 0.0–0.7)
Eosinophils Relative: 3.9 % (ref 0.0–5.0)
HCT: 43.7 % (ref 36.0–46.0)
Hemoglobin: 14.3 g/dL (ref 12.0–15.0)
Lymphocytes Relative: 38.6 % (ref 12.0–46.0)
Lymphs Abs: 3 10*3/uL (ref 0.7–4.0)
MCHC: 32.6 g/dL (ref 30.0–36.0)
MCV: 89.3 fl (ref 78.0–100.0)
Monocytes Absolute: 0.6 10*3/uL (ref 0.1–1.0)
Monocytes Relative: 7.6 % (ref 3.0–12.0)
Neutro Abs: 3.7 10*3/uL (ref 1.4–7.7)
Neutrophils Relative %: 48.3 % (ref 43.0–77.0)
Platelets: 397 10*3/uL (ref 150.0–400.0)
RBC: 4.89 Mil/uL (ref 3.87–5.11)
RDW: 15 % (ref 11.5–15.5)
WBC: 7.7 10*3/uL (ref 4.0–10.5)

## 2020-01-20 NOTE — Telephone Encounter (Signed)
Order placed for cbc. Please notify lab to add on.

## 2020-01-21 ENCOUNTER — Ambulatory Visit (INDEPENDENT_AMBULATORY_CARE_PROVIDER_SITE_OTHER): Payer: Medicare HMO | Admitting: Internal Medicine

## 2020-01-21 ENCOUNTER — Other Ambulatory Visit: Payer: Self-pay

## 2020-01-21 ENCOUNTER — Ambulatory Visit: Payer: Medicare HMO | Admitting: Internal Medicine

## 2020-01-21 DIAGNOSIS — E785 Hyperlipidemia, unspecified: Secondary | ICD-10-CM

## 2020-01-21 DIAGNOSIS — D473 Essential (hemorrhagic) thrombocythemia: Secondary | ICD-10-CM | POA: Diagnosis not present

## 2020-01-21 DIAGNOSIS — I10 Essential (primary) hypertension: Secondary | ICD-10-CM

## 2020-01-21 DIAGNOSIS — R739 Hyperglycemia, unspecified: Secondary | ICD-10-CM | POA: Diagnosis not present

## 2020-01-21 DIAGNOSIS — Z86010 Personal history of colonic polyps: Secondary | ICD-10-CM | POA: Diagnosis not present

## 2020-01-21 DIAGNOSIS — D75839 Thrombocytosis, unspecified: Secondary | ICD-10-CM

## 2020-01-21 NOTE — Progress Notes (Signed)
[name], female   DOB: 09-25-1945, 74 y.o.   MRN: [id number]   Subjective:    [name], female    DOB: 11/10/1945, 74 y.o.   MRN: [id number]  HPI This visit occurred during the SARS-CoV-2 public health emergency.  Safety protocols were in place, including screening questions prior to the visit, additional usage of staff PPE, and extensive cleaning of exam room while observing appropriate contact time as indicated for disinfecting solutions.  Patient here for a scheduled follow up.  She reports she is doing relatively well.  Tries to stay active. Walking.  No chest pain or sob reported.  No abdominal pain or bowel change reported.  Dry eyes.  Using restasis.  Handling stress.  Blood pressure doing well.    Past Medical History:  Diagnosis Date  . Blood in the stool    10 yrs ago  . Diverticulitis   . GERD (gastroesophageal reflux disease)   . History of abnormal Pap smear   . History of chicken pox   . History of colon polyps   . History of kidney stones    h/o   . Hypercholesterolemia   . Hypertension   . Lumbosacral spondylolysis   . Seronegative rheumatoid arthritis [facility])    Past Surgical History:  Procedure Laterality Date  . COLONOSCOPY    . DILATION AND CURETTAGE OF UTERUS    . KNEE ARTHROPLASTY Right 05/13/2019   Procedure: COMPUTER ASSISTED TOTAL KNEE ARTHROPLASTY RIGHT;  Surgeon: Dereck Leep, MD;  Location: [facility] ORS;  Service: Orthopedics;  Laterality: Right;  . LEG / ANKLE SOFT TISSUE BIOPSY  2011   to confirm arthritis   Family History  Problem Relation Age of Onset  . Arthritis Father   . Hyperlipidemia Father   . Heart disease Father   . Hypertension Father   . Diabetes Father   . Pulmonary fibrosis Father   . Arthritis Mother   . Heart disease Mother   . Hyperlipidemia Mother   . Hypertension Mother   . Colon cancer Maternal Aunt   . Colon cancer Maternal Uncle   . Breast cancer Neg Hx    Social History    Socioeconomic History  . Marital status: Divorced    Spouse name: Not on file  . Number of children: Not on file  . Years of education: Not on file  . Highest education level: Not on file  Occupational History  . Not on file  Tobacco Use  . Smoking status: Never Smoker  . Smokeless tobacco: Never Used  Vaping Use  . Vaping Use: Never used  Substance and Sexual Activity  . Alcohol use: Yes    Alcohol/week: 0.0 standard drinks    Comment: rare  . Drug use: No  . Sexual activity: Not on file  Other Topics Concern  . Not on file  Social History Narrative  . Not on file   Social Determinants of Health   Financial Resource Strain:   . Difficulty of Paying Living Expenses:   Food Insecurity:   . Worried About Charity fundraiser in the Last Year:   . Arboriculturist in the Last Year:   Transportation Needs:   . Film/video editor (Medical):   Marland Kitchen Lack of Transportation (Non-Medical):   Physical Activity:   . Days of Exercise per Week:   . Minutes of Exercise per Session:   Stress:   . Feeling of Stress :  Social Connections:   . Frequency of Communication with Friends and Family:   . Frequency of Social Gatherings with Friends and Family:   . Attends Religious Services:   . Active Member of Clubs or Organizations:   . Attends Archivist Meetings:   Marland Kitchen Marital Status:     Outpatient Encounter Medications as of 01/21/2020  Medication Sig  . aspirin EC 81 MG tablet Take 81 mg by mouth daily. Take 2 tablets daily.  . calcium carbonate (TUMS EX) 750 MG chewable tablet Chew 1 tablet by mouth daily.  . celecoxib (CELEBREX) 200 MG capsule Take 1 capsule (200 mg total) by mouth 2 (two) times daily.  . diphenhydramine-acetaminophen (TYLENOL PM) 25-500 MG TABS tablet Take 1 tablet by mouth at bedtime.  Marland Kitchen lisinopril (ZESTRIL) 10 MG tablet TAKE 1 TABLET DAILY  . Multiple Vitamin (MULTIVITAMIN WITH MINERALS) TABS tablet Take 1 tablet by mouth daily.  . Omega-3 Fatty  Acids (FISH OIL) 1200 MG CAPS Take 1,200 mg by mouth daily.  . pantoprazole (PROTONIX) 40 MG tablet TAKE 1 TABLET DAILY  . phentermine 30 MG capsule Take 30 mg by mouth daily before breakfast.  . Probiotic Product (PROBIOTIC PO) Take 1 capsule by mouth daily.  . simvastatin (ZOCOR) 20 MG tablet TAKE 1 TABLET EVERY EVENING   No facility-administered encounter medications on file as of 01/21/2020.    Review of Systems  Constitutional: Negative for appetite change and unexpected weight change.  HENT: Negative for congestion and sinus pressure.   Respiratory: Negative for cough, chest tightness and shortness of breath.   Cardiovascular: Negative for chest pain, palpitations and leg swelling.  Gastrointestinal: Negative for abdominal pain, diarrhea, nausea and vomiting.  Genitourinary: Negative for difficulty urinating and dysuria.  Musculoskeletal: Negative for joint swelling and myalgias.  Skin: Negative for color change and rash.  Neurological: Negative for dizziness, light-headedness and headaches.  Psychiatric/Behavioral: Negative for agitation and dysphoric mood.       Objective:    Physical Exam Vitals reviewed.  Constitutional:      General: She is not in acute distress.    Appearance: Normal appearance.  HENT:     Head: Normocephalic and atraumatic.     Right Ear: External ear normal.     Left Ear: External ear normal.  Eyes:     General: No scleral icterus.       Right eye: No discharge.        Left eye: No discharge.     Conjunctiva/sclera: Conjunctivae normal.  Neck:     Thyroid: No thyromegaly.  Cardiovascular:     Rate and Rhythm: Normal rate and regular rhythm.  Pulmonary:     Effort: No respiratory distress.     Breath sounds: Normal breath sounds. No wheezing.  Abdominal:     General: Bowel sounds are normal.     Palpations: Abdomen is soft.     Tenderness: There is no abdominal tenderness.  Musculoskeletal:        General: No swelling or tenderness.      Cervical back: Neck supple. No tenderness.  Lymphadenopathy:     Cervical: No cervical adenopathy.  Skin:    Findings: No erythema or rash.  Neurological:     Mental Status: She is alert.  Psychiatric:        Mood and Affect: Mood normal.        Behavior: Behavior normal.     BP 120/74   Pulse 96   Temp 97.7 F (  36.5 C)   Resp 16   Ht '5\' 1"'  (1.549 m)   Wt 170 lb (77.1 kg)   LMP 07/16/1996   SpO2 98%   BMI 32.12 kg/m  Wt Readings from Last 3 Encounters:  01/21/20 170 lb (77.1 kg)  09/17/19 170 lb 6.4 oz (77.3 kg)  07/15/19 163 lb (73.9 kg)     Lab Results  Component Value Date   WBC 7.7 01/20/2020   HGB 14.3 01/20/2020   HCT 43.7 01/20/2020   PLT 397.0 01/20/2020   GLUCOSE 112 (H) 01/19/2020   CHOL 150 01/19/2020   TRIG 200.0 (H) 01/19/2020   HDL 35.40 (L) 01/19/2020   LDLDIRECT 153.7 11/15/2010   LDLCALC 74 01/19/2020   ALT 20 01/19/2020   AST 21 01/19/2020   NA 141 01/19/2020   K 3.9 01/19/2020   CL 107 01/19/2020   CREATININE 0.85 01/19/2020   BUN 22 01/19/2020   CO2 24 01/19/2020   TSH 2.00 09/11/2019   INR 0.9 07/15/2019   HGBA1C 6.0 01/19/2020    MM 3D SCREEN BREAST BILATERAL  Result Date: 01/01/2020 CLINICAL DATA:  Screening. EXAM: DIGITAL SCREENING BILATERAL MAMMOGRAM WITH TOMO AND CAD COMPARISON:  Previous exam(s). ACR Breast Density Category a: The breast tissue is almost entirely fatty. FINDINGS: There are no findings suspicious for malignancy. Images were processed with CAD. IMPRESSION: No mammographic evidence of malignancy. A result letter of this screening mammogram will be mailed directly to the patient. RECOMMENDATION: Screening mammogram in one year. (Code:SM-B-01Y) BI-RADS CATEGORY  1: Negative. Electronically Signed   By: Ammie Ferrier M.D.   On: 01/01/2020 16:00       Assessment & Plan:   Problem List Items Addressed This Visit    History of colon polyps    Last colonoscopy 2010.  Overdue f/u colonoscopy.       Hyperglycemia     Low carb diet and exercise.  Follow met b and a1c.       Relevant Orders   Hemoglobin A1c   Hyperlipidemia    On simvastatin.  Low cholesterol diet and exercise.  Follow lipid panel and liver function tests.        Relevant Orders   Hepatic function panel   Lipid panel   Hypertension    Blood pressure doing well.  Recheck by me 114/74.  Continue lisinopril.  Follow pressures.  Follow metabolic panel.        Relevant Orders   Basic metabolic panel   Thrombocytosis ([place])    Saw hematology.  S/p bone marrow biopsy.  Recent check wnl.       Relevant Orders   CBC with Differential/Platelet       Einar Pheasant, MD

## 2020-01-25 ENCOUNTER — Other Ambulatory Visit: Payer: Medicare HMO

## 2020-01-26 ENCOUNTER — Telehealth: Payer: Medicare HMO | Admitting: Oncology

## 2020-01-27 ENCOUNTER — Encounter: Payer: Self-pay | Admitting: Internal Medicine

## 2020-01-27 NOTE — Assessment & Plan Note (Signed)
Blood pressure doing well.  Recheck by me 114/74.  Continue lisinopril.  Follow pressures.  Follow metabolic panel.

## 2020-01-27 NOTE — Assessment & Plan Note (Signed)
On simvastatin.  Low cholesterol diet and exercise.  Follow lipid panel and liver function tests.   

## 2020-01-27 NOTE — Assessment & Plan Note (Signed)
Low carb diet and exercise.  Follow met b and a1c.  

## 2020-01-27 NOTE — Assessment & Plan Note (Signed)
Saw hematology.  S/p bone marrow biopsy.  Recent check wnl.

## 2020-01-27 NOTE — Assessment & Plan Note (Signed)
Last colonoscopy 2010.  Overdue f/u colonoscopy.

## 2020-02-17 ENCOUNTER — Encounter: Payer: Self-pay | Admitting: Internal Medicine

## 2020-02-17 NOTE — Telephone Encounter (Signed)
Ok to schedule. Thanks

## 2020-02-18 NOTE — Telephone Encounter (Signed)
Lm to schedule

## 2020-02-24 DIAGNOSIS — M5136 Other intervertebral disc degeneration, lumbar region: Secondary | ICD-10-CM | POA: Diagnosis not present

## 2020-02-24 DIAGNOSIS — M47816 Spondylosis without myelopathy or radiculopathy, lumbar region: Secondary | ICD-10-CM | POA: Diagnosis not present

## 2020-02-28 ENCOUNTER — Other Ambulatory Visit: Payer: Self-pay | Admitting: Internal Medicine

## 2020-03-01 DIAGNOSIS — R69 Illness, unspecified: Secondary | ICD-10-CM | POA: Diagnosis not present

## 2020-03-08 ENCOUNTER — Inpatient Hospital Stay: Payer: Medicare HMO | Attending: Oncology

## 2020-03-08 ENCOUNTER — Other Ambulatory Visit: Payer: Self-pay

## 2020-03-08 DIAGNOSIS — D473 Essential (hemorrhagic) thrombocythemia: Secondary | ICD-10-CM | POA: Diagnosis not present

## 2020-03-08 DIAGNOSIS — D75839 Thrombocytosis, unspecified: Secondary | ICD-10-CM

## 2020-03-08 LAB — CBC WITH DIFFERENTIAL/PLATELET
Abs Immature Granulocytes: 0.01 10*3/uL (ref 0.00–0.07)
Basophils Absolute: 0.1 10*3/uL (ref 0.0–0.1)
Basophils Relative: 1 %
Eosinophils Absolute: 0.3 10*3/uL (ref 0.0–0.5)
Eosinophils Relative: 4 %
HCT: 41.1 % (ref 36.0–46.0)
Hemoglobin: 14.1 g/dL (ref 12.0–15.0)
Immature Granulocytes: 0 %
Lymphocytes Relative: 31 %
Lymphs Abs: 1.9 10*3/uL (ref 0.7–4.0)
MCH: 29.9 pg (ref 26.0–34.0)
MCHC: 34.3 g/dL (ref 30.0–36.0)
MCV: 87.3 fL (ref 80.0–100.0)
Monocytes Absolute: 0.6 10*3/uL (ref 0.1–1.0)
Monocytes Relative: 9 %
Neutro Abs: 3.5 10*3/uL (ref 1.7–7.7)
Neutrophils Relative %: 55 %
Platelets: 393 10*3/uL (ref 150–400)
RBC: 4.71 MIL/uL (ref 3.87–5.11)
RDW: 14.1 % (ref 11.5–15.5)
WBC: 6.3 10*3/uL (ref 4.0–10.5)
nRBC: 0 % (ref 0.0–0.2)

## 2020-03-10 ENCOUNTER — Inpatient Hospital Stay (HOSPITAL_BASED_OUTPATIENT_CLINIC_OR_DEPARTMENT_OTHER): Payer: Medicare HMO | Admitting: Oncology

## 2020-03-10 ENCOUNTER — Encounter: Payer: Self-pay | Admitting: Oncology

## 2020-03-10 DIAGNOSIS — D473 Essential (hemorrhagic) thrombocythemia: Secondary | ICD-10-CM

## 2020-03-10 DIAGNOSIS — D75839 Thrombocytosis, unspecified: Secondary | ICD-10-CM

## 2020-03-10 NOTE — Progress Notes (Signed)
Patient called/ pre- screened for virtual appoinment today with oncologist. No concerns expressed.

## 2020-03-11 NOTE — Progress Notes (Signed)
I connected with Caitlin Bennett on 03/11/20 at  2:30 PM EDT by video enabled telemedicine visit and verified that I am speaking with the correct person using two identifiers.   I discussed the limitations, risks, security and privacy concerns of performing an evaluation and management service by telemedicine and the availability of in-person appointments. I also discussed with the patient that there may be a patient responsible charge related to this service. The patient expressed understanding and agreed to proceed.  Other persons participating in the visit and their role in the encounter:  none  Patient's location:  home Provider's location:  work  Risk analyst Complaint: Routine follow-up of thrombocytosis  History of present illness: Patient is a 74 year old female with a past medical history significant for hypertension, hyperlipidemia referred to [place] for thrombocytosis.Looking back at her CBC over the last 4 years patient has had consistentmild thrombocytosis and her platelet counts have been mostly between 122 430. More recently in September 2019 her platelet count was 451 followed by 498 in January 2020 and 507 in August 2020. She has had a normal white count with a normal differential and a normal hemoglobin as well. No personal history of DVT PE heart attacks or strokes. Overall she feels well and her appetite and weight have been stable. Denies any fatigue or unintentional weight loss or drenching night sweats. Denies any abdominal pain. He does have ongoing back and knee pain for which she sees orthopedics. He has a surgery for her knee is in October 2020.  Jak 2 mutation testing as well as CAL R and MPL mutation testing was negative. BCR able gene rearrangement was negative. CMP was normal. Ferritin and iron studies were normal. ESR was normal. 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 Reactive process favored.   Interval history: Patient reports doing well and denies any complaints at this time.  She has not had any thromboembolic episodes.  Appetite and energy levels are good   Review of Systems  Constitutional: Negative for chills, fever, malaise/fatigue and weight loss.  HENT: Negative for congestion, ear discharge and nosebleeds.   Eyes: Negative for blurred vision.  Respiratory: Negative for cough, hemoptysis, sputum production, shortness of breath and wheezing.   Cardiovascular: Negative for chest pain, palpitations, orthopnea and claudication.  Gastrointestinal: Negative for abdominal pain, blood in stool, constipation, diarrhea, heartburn, melena, nausea and vomiting.  Genitourinary: Negative for dysuria, flank pain, frequency, hematuria and urgency.  Musculoskeletal: Negative for back pain, joint pain and myalgias.  Skin: Negative for rash.  Neurological: Negative for dizziness, tingling, focal weakness, seizures, weakness and headaches.  Endo/Heme/Allergies: Does not bruise/bleed easily.  Psychiatric/Behavioral: Negative for depression and suicidal ideas. The patient does not have insomnia.     Allergies  Allergen Reactions  . Sumycin [Tetracycline Hcl] Rash    Past Medical History:  Diagnosis Date  . Blood in the stool    10 yrs ago  . Diverticulitis   . GERD (gastroesophageal reflux disease)   . History of abnormal Pap smear   . History of chicken pox   . History of colon polyps   . History of kidney stones    h/o   . Hypercholesterolemia   . Hypertension   . Lumbosacral spondylolysis   . Seronegative rheumatoid arthritis ([place])  Past Surgical History:  Procedure Laterality Date  . COLONOSCOPY    . DILATION AND CURETTAGE OF UTERUS    . KNEE ARTHROPLASTY Right 05/13/2019   Procedure: COMPUTER ASSISTED TOTAL KNEE  ARTHROPLASTY RIGHT;  Surgeon: Dereck Leep, MD;  Location: [facility] ORS;  Service: Orthopedics;  Laterality: Right;  . LEG / ANKLE SOFT TISSUE BIOPSY  2011   to confirm arthritis    Social History   Socioeconomic History  . Marital status: Divorced    Spouse name: Not on file  . Number of children: Not on file  . Years of education: Not on file  . Highest education level: Not on file  Occupational History  . Not on file  Tobacco Use  . Smoking status: Never Smoker  . Smokeless tobacco: Never Used  Vaping Use  . Vaping Use: Never used  Substance and Sexual Activity  . Alcohol use: Yes    Alcohol/week: 0.0 standard drinks    Comment: rare  . Drug use: No  . Sexual activity: Not on file  Other Topics Concern  . Not on file  Social History Narrative  . Not on file   Social Determinants of Health   Financial Resource Strain:   . Difficulty of Paying Living Expenses: Not on file  Food Insecurity:   . Worried About Charity fundraiser in the Last Year: Not on file  . Ran Out of Food in the Last Year: Not on file  Transportation Needs:   . Lack of Transportation (Medical): Not on file  . Lack of Transportation (Non-Medical): Not on file  Physical Activity:   . Days of Exercise per Week: Not on file  . Minutes of Exercise per Session: Not on file  Stress:   . Feeling of Stress : Not on file  Social Connections:   . Frequency of Communication with Friends and Family: Not on file  . Frequency of Social Gatherings with Friends and Family: Not on file  . Attends Religious Services: Not on file  . Active Member of Clubs or Organizations: Not on file  . Attends Archivist Meetings: Not on file  . Marital Status: Not on file  Intimate Partner Violence:   . Fear of Current or Ex-Partner: Not on file  . Emotionally Abused: Not on file  . Physically Abused: Not on file  . Sexually Abused: Not on file    Family History  Problem Relation Age of Onset  . Arthritis  Father   . Hyperlipidemia Father   . Heart disease Father   . Hypertension Father   . Diabetes Father   . Pulmonary fibrosis Father   . Arthritis Mother   . Heart disease Mother   . Hyperlipidemia Mother   . Hypertension Mother   . Colon cancer Maternal Aunt   . Colon cancer Maternal Uncle   . Breast cancer Neg Hx      Current Outpatient Medications:  .  aspirin EC 81 MG tablet, Take 81 mg by mouth daily. Take 2 tablets daily., Disp: , Rfl:  .  calcium carbonate (TUMS EX) 750 MG chewable tablet, Chew 1 tablet by mouth daily., Disp: , Rfl:  .  diphenhydramine-acetaminophen (TYLENOL PM) 25-500 MG TABS tablet, Take 1 tablet by mouth at bedtime., Disp: , Rfl:  .  lisinopril (ZESTRIL) 10 MG tablet, TAKE 1 TABLET DAILY, Disp: 90 tablet, Rfl: 1 .  meloxicam (MOBIC) 15 MG tablet, Take 15 mg by mouth daily., Disp: , Rfl:  .  Multiple Vitamin (MULTIVITAMIN WITH MINERALS) TABS tablet, Take 1 tablet by mouth daily., Disp: , Rfl:  .  Omega-3 Fatty Acids (FISH OIL) 1200 MG CAPS, Take 1,200 mg by mouth daily., Disp: , Rfl:  .  pantoprazole (PROTONIX) 40 MG tablet, TAKE 1 TABLET DAILY, Disp: 90 tablet, Rfl: 1 .  phentermine 30 MG capsule, Take 30 mg by mouth daily before breakfast., Disp: , Rfl:  .  Probiotic Product (PROBIOTIC PO), Take 1 capsule by mouth daily., Disp: , Rfl:  .  simvastatin (ZOCOR) 20 MG tablet, TAKE 1 TABLET EVERY EVENING, Disp: 90 tablet, Rfl: 1 .  traMADol (ULTRAM) 50 MG tablet, 1-2 po bid prn, Disp: , Rfl:  .  celecoxib (CELEBREX) 200 MG capsule, Take 1 capsule (200 mg total) by mouth 2 (two) times daily. (Patient not taking: Reported on 03/10/2020), Disp: 84 capsule, Rfl: 0 .  RESTASIS 0.05 % ophthalmic emulsion, 1 drop 2 (two) times daily., Disp: , Rfl:   No results found.  No images are attached to the encounter.   CMP Latest Ref Rng & Units 01/19/2020  Glucose 70 - 99 mg/dL 112(H)  BUN 6 - 23 mg/dL 22  Creatinine 0.40 - 1.20 mg/dL 0.85  Sodium 135 - 145 mEq/L 141   Potassium 3.5 - 5.1 mEq/L 3.9  Chloride 96 - 112 mEq/L 107  CO2 19 - 32 mEq/L 24  Calcium 8.4 - 10.5 mg/dL 9.6  Total Protein 6.0 - 8.3 g/dL 7.3  Total Bilirubin 0.2 - 1.2 mg/dL 0.5  Alkaline Phos 39 - 117 U/L 74  AST 0 - 37 U/L 21  ALT 0 - 35 U/L 20   CBC Latest Ref Rng & Units 03/08/2020  WBC 4.0 - 10.5 K/uL 6.3  Hemoglobin 12.0 - 15.0 g/dL 14.1  Hematocrit 36 - 46 % 41.1  Platelets 150 - 400 K/uL 393     Observation/objective: Appears in no acute distress over video visit today.  Breathing is nonlabored  Assessment and plan: Patient is a 74 year old female with history of thrombocytosis likely reactive and this is a routine follow-up visit  Patient had persistent thrombocytosis with platelet counts ranging in the 400s to 500s between 2019 09/04/2018.  Her last 2 platelet counts in July and August 2021 have been normal and today is 393.Bone marrow biopsy did not reveal any evidence of myeloproliferative neoplasm.  Patient has not had any thromboembolic episodes.  Patient can continue to follow-up with Dr. Nicki Reaper at this time and get her CBC checked every 6 months to a year.  If there is a consistent increase in her platelet count she can be referred to [place] in the future  Follow-up instructions: No follow-up needed  I discussed the assessment and treatment plan with the patient. The patient was provided an opportunity to ask questions and all were answered. The patient agreed with the plan and demonstrated an understanding of the instructions.   The patient was advised to call back or seek an in-person evaluation if the symptoms worsen or if the condition fails to improve as anticipated.   Visit Diagnosis: 1. Thrombocytosis ([place])     Dr. Randa Evens, MD, MPH [facility] Tel- [phone] 03/11/2020 8:32 AM

## 2020-03-12 ENCOUNTER — Telehealth: Payer: Self-pay | Admitting: Internal Medicine

## 2020-03-12 DIAGNOSIS — Z1211 Encounter for screening for malignant neoplasm of colon: Secondary | ICD-10-CM

## 2020-03-12 NOTE — Telephone Encounter (Signed)
Per notes, pt due colonoscopy.  (unless she has had one in the last year or so, she is due).  If agreeable, let me know and I can place order for referral.  Also need to know who she prefers to see.

## 2020-03-14 NOTE — Telephone Encounter (Signed)
Order placed for referral to GI 

## 2020-03-14 NOTE — Telephone Encounter (Signed)
Pt agreeable to referral. Would like to go to [facility].

## 2020-03-14 NOTE — Addendum Note (Signed)
Addended by: Alisa Graff on: 03/14/2020 05:15 PM   Modules accepted: Orders

## 2020-04-20 DIAGNOSIS — R69 Illness, unspecified: Secondary | ICD-10-CM | POA: Diagnosis not present

## 2020-05-09 ENCOUNTER — Encounter: Payer: Self-pay | Admitting: Internal Medicine

## 2020-05-12 DIAGNOSIS — Z96651 Presence of right artificial knee joint: Secondary | ICD-10-CM | POA: Diagnosis not present

## 2020-05-12 DIAGNOSIS — M7521 Bicipital tendinitis, right shoulder: Secondary | ICD-10-CM | POA: Diagnosis not present

## 2020-05-20 ENCOUNTER — Other Ambulatory Visit: Payer: Self-pay

## 2020-05-20 ENCOUNTER — Other Ambulatory Visit (INDEPENDENT_AMBULATORY_CARE_PROVIDER_SITE_OTHER): Payer: Medicare HMO

## 2020-05-20 DIAGNOSIS — R739 Hyperglycemia, unspecified: Secondary | ICD-10-CM

## 2020-05-20 DIAGNOSIS — E785 Hyperlipidemia, unspecified: Secondary | ICD-10-CM

## 2020-05-20 DIAGNOSIS — I10 Essential (primary) hypertension: Secondary | ICD-10-CM | POA: Diagnosis not present

## 2020-05-20 DIAGNOSIS — D75839 Thrombocytosis, unspecified: Secondary | ICD-10-CM | POA: Diagnosis not present

## 2020-05-20 LAB — LIPID PANEL
Cholesterol: 152 mg/dL (ref 0–200)
HDL: 38 mg/dL — ABNORMAL LOW (ref >39.00)
LDL Cholesterol: 85 mg/dL (ref 0–99)
NonHDL: 114.4
Total CHOL/HDL Ratio: 4
Triglycerides: 149 mg/dL (ref 0.0–149.0)
VLDL: 29.8 mg/dL (ref 0.0–40.0)

## 2020-05-20 LAB — BASIC METABOLIC PANEL
BUN: 22 mg/dL (ref 6–23)
CO2: 29 mEq/L (ref 19–32)
Calcium: 9.2 mg/dL (ref 8.4–10.5)
Chloride: 105 mEq/L (ref 96–112)
Creatinine, Ser: 0.75 mg/dL (ref 0.40–1.20)
GFR: 77.73 mL/min (ref >60.00)
Glucose, Bld: 97 mg/dL (ref 70–99)
Potassium: 4.3 mEq/L (ref 3.5–5.1)
Sodium: 141 mEq/L (ref 135–145)

## 2020-05-20 LAB — HEPATIC FUNCTION PANEL
ALT: 23 U/L (ref 0–35)
AST: 23 U/L (ref 0–37)
Albumin: 4.1 g/dL (ref 3.5–5.2)
Alkaline Phosphatase: 76 U/L (ref 39–117)
Bilirubin, Direct: 0.1 mg/dL (ref 0.0–0.3)
Total Bilirubin: 0.6 mg/dL (ref 0.2–1.2)
Total Protein: 6.8 g/dL (ref 6.0–8.3)

## 2020-05-20 LAB — CBC WITH DIFFERENTIAL/PLATELET
Basophils Absolute: 0.1 10*3/uL (ref 0.0–0.1)
Basophils Relative: 0.8 % (ref 0.0–3.0)
Eosinophils Absolute: 0.2 10*3/uL (ref 0.0–0.7)
Eosinophils Relative: 3.3 % (ref 0.0–5.0)
HCT: 43.7 % (ref 36.0–46.0)
Hemoglobin: 14.5 g/dL (ref 12.0–15.0)
Lymphocytes Relative: 30.5 % (ref 12.0–46.0)
Lymphs Abs: 1.8 10*3/uL (ref 0.7–4.0)
MCHC: 33.2 g/dL (ref 30.0–36.0)
MCV: 87.9 fl (ref 78.0–100.0)
Monocytes Absolute: 0.5 10*3/uL (ref 0.1–1.0)
Monocytes Relative: 8.1 % (ref 3.0–12.0)
Neutro Abs: 3.5 10*3/uL (ref 1.4–7.7)
Neutrophils Relative %: 57.3 % (ref 43.0–77.0)
Platelets: 447 10*3/uL — ABNORMAL HIGH (ref 150.0–400.0)
RBC: 4.97 Mil/uL (ref 3.87–5.11)
RDW: 14.1 % (ref 11.5–15.5)
WBC: 6.1 10*3/uL (ref 4.0–10.5)

## 2020-05-20 LAB — HEMOGLOBIN A1C: Hgb A1c MFr Bld: 6.4 % (ref 4.6–6.5)

## 2020-05-24 ENCOUNTER — Encounter: Payer: Self-pay | Admitting: Internal Medicine

## 2020-05-24 ENCOUNTER — Ambulatory Visit (INDEPENDENT_AMBULATORY_CARE_PROVIDER_SITE_OTHER): Payer: Medicare HMO | Admitting: Internal Medicine

## 2020-05-24 ENCOUNTER — Other Ambulatory Visit: Payer: Self-pay

## 2020-05-24 VITALS — BP 128/70 | HR 100 | Temp 97.8°F | Resp 16 | Ht 61.0 in | Wt 170.0 lb

## 2020-05-24 DIAGNOSIS — E785 Hyperlipidemia, unspecified: Secondary | ICD-10-CM

## 2020-05-24 DIAGNOSIS — D75839 Thrombocytosis, unspecified: Secondary | ICD-10-CM

## 2020-05-24 DIAGNOSIS — M25562 Pain in left knee: Secondary | ICD-10-CM

## 2020-05-24 DIAGNOSIS — R739 Hyperglycemia, unspecified: Secondary | ICD-10-CM

## 2020-05-24 DIAGNOSIS — I10 Essential (primary) hypertension: Secondary | ICD-10-CM

## 2020-05-24 DIAGNOSIS — Z800 Family history of malignant neoplasm of digestive organs: Secondary | ICD-10-CM

## 2020-05-24 DIAGNOSIS — M25561 Pain in right knee: Secondary | ICD-10-CM

## 2020-05-24 DIAGNOSIS — N8110 Cystocele, unspecified: Secondary | ICD-10-CM | POA: Diagnosis not present

## 2020-05-24 NOTE — Progress Notes (Addendum)
[name], female   DOB: 07/18/1945, 74 y.o.   MRN: [id number]   Subjective:    [name], female    DOB: Feb 15, 1946, 74 y.o.   MRN: [id number]  HPI This visit occurred during the SARS-CoV-2 public health emergency.  Safety protocols were in place, including screening questions prior to the visit, additional usage of staff PPE, and extensive cleaning of exam room while observing appropriate contact time as indicated for disinfecting solutions.  Patient here for a scheduled follow up.  Here to f/u regarding her blood sugar, blood pressure and cholesterol.  She is doing well.  Just saw Dr Marry Guan last week.  Knee doing well.  Exercises - walks.  No chest pain or sob with increased activity or exertion.  No cough or congestion.  Some constipation with tramadol.  Takes metamucil.  Helps.  Had questions about a possible rectocele or prolapse.  Discussed.  Discussed labs.  Planning to see GI soon - evaluation for colonoscopy.    Past Medical History:  Diagnosis Date   Blood in the stool    10 yrs ago   Diverticulitis    GERD (gastroesophageal reflux disease)    History of abnormal Pap smear    History of chicken pox    History of colon polyps    History of kidney stones    h/o    Hypercholesterolemia    Hypertension    Lumbosacral spondylolysis    Seronegative rheumatoid arthritis ([place])    Past Surgical History:  Procedure Laterality Date   COLONOSCOPY     DILATION AND CURETTAGE OF UTERUS     KNEE ARTHROPLASTY Right 05/13/2019   Procedure: COMPUTER ASSISTED TOTAL KNEE ARTHROPLASTY RIGHT;  Surgeon: Dereck Leep, MD;  Location: [facility] ORS;  Service: Orthopedics;  Laterality: Right;   LEG / ANKLE SOFT TISSUE BIOPSY  2011   to confirm arthritis   Family History  Problem Relation Age of Onset   Arthritis Father    Hyperlipidemia Father    Heart disease Father    Hypertension Father    Diabetes Father    Pulmonary fibrosis Father     Arthritis Mother    Heart disease Mother    Hyperlipidemia Mother    Hypertension Mother    Colon cancer Maternal Aunt    Colon cancer Maternal Uncle    Breast cancer Neg Hx    Social History   Socioeconomic History   Marital status: Divorced    Spouse name: Not on file   Number of children: Not on file   Years of education: Not on file   Highest education level: Not on file  Occupational History   Not on file  Tobacco Use   Smoking status: Never Smoker   Smokeless tobacco: Never Used  Vaping Use   Vaping Use: Never used  Substance and Sexual Activity   Alcohol use: Yes    Alcohol/week: 0.0 standard drinks    Comment: rare   Drug use: No   Sexual activity: Not on file  Other Topics Concern   Not on file  Social History Narrative   Not on file   Social Determinants of Health   Financial Resource Strain:    Difficulty of Paying Living Expenses: Not on file  Food Insecurity:    Worried About [place] in the Last Year: Not on file   Ran Out of Food in the Last Year: Not on file  Transportation Needs:  Lack of Transportation (Medical): Not on file   Lack of Transportation (Non-Medical): Not on file  Physical Activity:    Days of Exercise per Week: Not on file   Minutes of Exercise per Session: Not on file  Stress:    Feeling of Stress : Not on file  Social Connections:    Frequency of Communication with Friends and Family: Not on file   Frequency of Social Gatherings with Friends and Family: Not on file   Attends Religious Services: Not on file   Active Member of Clubs or Organizations: Not on file   Attends Club or Organization Meetings: Not on file   Marital Status: Not on file    Outpatient Encounter Medications as of 05/24/2020  Medication Sig   aspirin EC 81 MG tablet Take 81 mg by mouth daily. Take 2 tablets daily.   calcium carbonate (TUMS EX) 750 MG chewable tablet Chew 1 tablet by mouth daily.    diphenhydramine-acetaminophen (TYLENOL PM) 25-500 MG TABS tablet Take 1 tablet by mouth at bedtime.   lisinopril (ZESTRIL) 10 MG tablet TAKE 1 TABLET DAILY   meloxicam (MOBIC) 15 MG tablet Take 15 mg by mouth daily.   Multiple Vitamin (MULTIVITAMIN WITH MINERALS) TABS tablet Take 1 tablet by mouth daily.   Omega-3 Fatty Acids (FISH OIL) 1200 MG CAPS Take 1,200 mg by mouth daily.   pantoprazole (PROTONIX) 40 MG tablet TAKE 1 TABLET DAILY   Probiotic Product (PROBIOTIC PO) Take 1 capsule by mouth daily.   simvastatin (ZOCOR) 20 MG tablet TAKE 1 TABLET EVERY EVENING   traMADol (ULTRAM) 50 MG tablet 1-2 po bid prn   [DISCONTINUED] celecoxib (CELEBREX) 200 MG capsule Take 1 capsule (200 mg total) by mouth 2 (two) times daily. (Patient not taking: Reported on 03/10/2020)   [DISCONTINUED] phentermine 30 MG capsule Take 30 mg by mouth daily before breakfast.   [DISCONTINUED] RESTASIS 0.05 % ophthalmic emulsion 1 drop 2 (two) times daily.   No facility-administered encounter medications on file as of 05/24/2020.    Review of Systems  Constitutional: Negative for appetite change and unexpected weight change.  HENT: Negative for congestion and sinus pressure.   Respiratory: Negative for cough, chest tightness and shortness of breath.   Cardiovascular: Negative for chest pain, palpitations and leg swelling.  Gastrointestinal: Negative for abdominal pain, diarrhea, nausea and vomiting.  Genitourinary: Negative for difficulty urinating and dysuria.  Musculoskeletal: Negative for joint swelling and myalgias.  Skin: Negative for color change and rash.  Neurological: Negative for dizziness, light-headedness and headaches.  Psychiatric/Behavioral: Negative for agitation and dysphoric mood.       Objective:    Physical Exam Vitals reviewed.  Constitutional:      General: She is not in acute distress.    Appearance: Normal appearance.  HENT:     Head: Normocephalic and atraumatic.      Right Ear: External ear normal.     Left Ear: External ear normal.  Eyes:     General: No scleral icterus.       Right eye: No discharge.        Left eye: No discharge.     Conjunctiva/sclera: Conjunctivae normal.  Neck:     Thyroid: No thyromegaly.  Cardiovascular:     Rate and Rhythm: Normal rate and regular rhythm.  Pulmonary:     Effort: No respiratory distress.     Breath sounds: Normal breath sounds. No wheezing.  Abdominal:     General: Bowel sounds are normal.  Palpations: Abdomen is soft.     Tenderness: There is no abdominal tenderness.  Musculoskeletal:        General: No swelling or tenderness.     Cervical back: Neck supple. No tenderness.  Lymphadenopathy:     Cervical: No cervical adenopathy.  Skin:    Findings: No erythema or rash.  Neurological:     Mental Status: She is alert.  Psychiatric:        Mood and Affect: Mood normal.        Behavior: Behavior normal.     BP 128/70    Pulse 100    Temp 97.8 F (36.6 C) (Oral)    Resp 16    Ht _0  (1.549 m)    Wt 170 lb (77.1 kg)    LMP 07/16/1996    SpO2 98%    BMI 32.12 kg/m  Wt Readings from Last 3 Encounters:  05/24/20 170 lb (77.1 kg)  01/21/20 170 lb (77.1 kg)  09/17/19 170 lb 6.4 oz (77.3 kg)     Lab Results  Component Value Date   WBC 6.1 05/20/2020   HGB 14.5 05/20/2020   HCT 43.7 05/20/2020   PLT 447.0 (H) 05/20/2020   GLUCOSE 97 05/20/2020   CHOL 152 05/20/2020   TRIG 149.0 05/20/2020   HDL 38.00 (L) 05/20/2020   LDLDIRECT 153.7 11/15/2010   LDLCALC 85 05/20/2020   ALT 23 05/20/2020   AST 23 05/20/2020   NA 141 05/20/2020   K 4.3 05/20/2020   CL 105 05/20/2020   CREATININE 0.75 05/20/2020   BUN 22 05/20/2020   CO2 29 05/20/2020   TSH 2.00 09/11/2019   INR 0.9 07/15/2019   HGBA1C 6.4 05/20/2020    MM 3D SCREEN BREAST BILATERAL  Result Date: 01/01/2020 CLINICAL DATA:  Screening. EXAM: DIGITAL SCREENING BILATERAL MAMMOGRAM WITH TOMO AND CAD COMPARISON:  Previous exam(s). ACR  Breast Density Category a: The breast tissue is almost entirely fatty. FINDINGS: There are no findings suspicious for malignancy. Images were processed with CAD. IMPRESSION: No mammographic evidence of malignancy. A result letter of this screening mammogram will be mailed directly to the patient. RECOMMENDATION: Screening mammogram in one year. (Code:SM-B-01Y) BI-RADS CATEGORY  1: Negative. Electronically Signed   By: Ammie Ferrier M.D.   On: 01/01/2020 16:00       Assessment & Plan:   Problem List Items Addressed This Visit    Vaginal prolapse    Presumed vaginal prolapse.  Discussed evaluation.  Declined pelvic exam.  Follow.  Notify me if desires further evaluation.        Thrombocytosis - Primary    Increased platelet count.  Recheck cbc to confirm stable/norma.        Relevant Orders   CBC with Differential/Platelet   Knee pain, bilateral    Followed by ortho.  Has tramadol.  Follow.        Hypertension    Continue lisinopril.  Blood pressure doing well.  Follow pressures.  Follow metabolic panel.        Hyperlipidemia    On simvastatin.  Low cholesterol diet and exercise.  Follow lipid panel and liver function tests.        Hyperglycemia    Low carb diet and exercise.  Follow met b and a1c.       Family history of colon cancer    Follow up scheduled with GI.            Einar Pheasant, MD

## 2020-05-29 ENCOUNTER — Encounter: Payer: Self-pay | Admitting: Internal Medicine

## 2020-05-29 DIAGNOSIS — N8110 Cystocele, unspecified: Secondary | ICD-10-CM | POA: Insufficient documentation

## 2020-05-29 NOTE — Assessment & Plan Note (Signed)
Continue lisinopril.  Blood pressure doing well.  Follow pressures.  Follow metabolic panel.  

## 2020-05-29 NOTE — Assessment & Plan Note (Signed)
On simvastatin.  Low cholesterol diet and exercise.  Follow lipid panel and liver function tests.   

## 2020-05-29 NOTE — Assessment & Plan Note (Signed)
Followed by ortho.  Has tramadol.  Follow.

## 2020-05-29 NOTE — Assessment & Plan Note (Signed)
Low carb diet and exercise.  Follow met b and a1c.  

## 2020-05-29 NOTE — Assessment & Plan Note (Signed)
Increased platelet count.  Recheck cbc to confirm stable/norma.

## 2020-05-29 NOTE — Assessment & Plan Note (Signed)
Presumed vaginal prolapse.  Discussed evaluation.  Declined pelvic exam.  Follow.  Notify me if desires further evaluation.

## 2020-05-29 NOTE — Assessment & Plan Note (Signed)
Follow up scheduled with GI.

## 2020-06-02 DIAGNOSIS — Z1211 Encounter for screening for malignant neoplasm of colon: Secondary | ICD-10-CM | POA: Diagnosis not present

## 2020-06-02 DIAGNOSIS — K219 Gastro-esophageal reflux disease without esophagitis: Secondary | ICD-10-CM | POA: Diagnosis not present

## 2020-07-12 ENCOUNTER — Other Ambulatory Visit: Payer: Self-pay | Admitting: Internal Medicine

## 2020-07-19 DIAGNOSIS — Z01818 Encounter for other preprocedural examination: Secondary | ICD-10-CM | POA: Diagnosis not present

## 2020-07-22 DIAGNOSIS — K5730 Diverticulosis of large intestine without perforation or abscess without bleeding: Secondary | ICD-10-CM | POA: Diagnosis not present

## 2020-07-22 DIAGNOSIS — Z1211 Encounter for screening for malignant neoplasm of colon: Secondary | ICD-10-CM | POA: Diagnosis not present

## 2020-07-22 DIAGNOSIS — K640 First degree hemorrhoids: Secondary | ICD-10-CM | POA: Diagnosis not present

## 2020-07-25 ENCOUNTER — Other Ambulatory Visit (INDEPENDENT_AMBULATORY_CARE_PROVIDER_SITE_OTHER): Payer: Medicare HMO

## 2020-07-25 ENCOUNTER — Other Ambulatory Visit: Payer: Self-pay

## 2020-07-25 DIAGNOSIS — D75839 Thrombocytosis, unspecified: Secondary | ICD-10-CM

## 2020-07-25 LAB — CBC WITH DIFFERENTIAL/PLATELET
Basophils Absolute: 0.1 10*3/uL (ref 0.0–0.1)
Basophils Relative: 1 % (ref 0.0–3.0)
Eosinophils Absolute: 0.2 10*3/uL (ref 0.0–0.7)
Eosinophils Relative: 3.1 % (ref 0.0–5.0)
HCT: 42.4 % (ref 36.0–46.0)
Hemoglobin: 14.2 g/dL (ref 12.0–15.0)
Lymphocytes Relative: 28.2 % (ref 12.0–46.0)
Lymphs Abs: 1.7 10*3/uL (ref 0.7–4.0)
MCHC: 33.4 g/dL (ref 30.0–36.0)
MCV: 87.9 fl (ref 78.0–100.0)
Monocytes Absolute: 0.4 10*3/uL (ref 0.1–1.0)
Monocytes Relative: 7 % (ref 3.0–12.0)
Neutro Abs: 3.6 10*3/uL (ref 1.4–7.7)
Neutrophils Relative %: 60.7 % (ref 43.0–77.0)
Platelets: 473 10*3/uL — ABNORMAL HIGH (ref 150.0–400.0)
RBC: 4.83 Mil/uL (ref 3.87–5.11)
RDW: 14.5 % (ref 11.5–15.5)
WBC: 5.9 10*3/uL (ref 4.0–10.5)

## 2020-07-26 ENCOUNTER — Other Ambulatory Visit: Payer: Self-pay | Admitting: Internal Medicine

## 2020-07-26 DIAGNOSIS — D75839 Thrombocytosis, unspecified: Secondary | ICD-10-CM

## 2020-07-26 NOTE — Progress Notes (Signed)
Order placed for f/u lab.   

## 2020-08-12 ENCOUNTER — Telehealth: Payer: Self-pay | Admitting: Internal Medicine

## 2020-08-12 MED ORDER — LISINOPRIL 10 MG PO TABS: 10.0000 mg | ORAL_TABLET | Freq: Every day | ORAL | 1 refills | Status: DC

## 2020-08-12 MED ORDER — SIMVASTATIN 20 MG PO TABS: 20.0000 mg | ORAL_TABLET | Freq: Every evening | ORAL | 1 refills | Status: DC

## 2020-08-12 NOTE — Telephone Encounter (Signed)
Patient called in for refill forlisinopril (ZESTRIL) 10 MG tablet,simvastatin (ZOCOR) 20 MG tablet

## 2020-08-13 ENCOUNTER — Encounter: Payer: Self-pay | Admitting: Internal Medicine

## 2020-08-15 ENCOUNTER — Other Ambulatory Visit: Payer: Self-pay

## 2020-08-15 MED ORDER — SIMVASTATIN 20 MG PO TABS: 20.0000 mg | ORAL_TABLET | Freq: Every evening | ORAL | 1 refills | Status: DC

## 2020-08-15 MED ORDER — LISINOPRIL 10 MG PO TABS: 10.0000 mg | ORAL_TABLET | Freq: Every day | ORAL | 1 refills | Status: DC

## 2020-08-24 ENCOUNTER — Encounter: Payer: Self-pay | Admitting: Internal Medicine

## 2020-08-24 DIAGNOSIS — R300 Dysuria: Secondary | ICD-10-CM

## 2020-08-25 ENCOUNTER — Encounter: Payer: Self-pay | Admitting: Internal Medicine

## 2020-08-25 ENCOUNTER — Other Ambulatory Visit (INDEPENDENT_AMBULATORY_CARE_PROVIDER_SITE_OTHER): Payer: Medicare HMO

## 2020-08-25 ENCOUNTER — Telehealth (INDEPENDENT_AMBULATORY_CARE_PROVIDER_SITE_OTHER): Payer: Medicare HMO | Admitting: Internal Medicine

## 2020-08-25 ENCOUNTER — Other Ambulatory Visit: Payer: Self-pay

## 2020-08-25 VITALS — Wt 170.0 lb

## 2020-08-25 DIAGNOSIS — R300 Dysuria: Secondary | ICD-10-CM

## 2020-08-25 DIAGNOSIS — I10 Essential (primary) hypertension: Secondary | ICD-10-CM

## 2020-08-25 DIAGNOSIS — D75839 Thrombocytosis, unspecified: Secondary | ICD-10-CM

## 2020-08-25 DIAGNOSIS — R82998 Other abnormal findings in urine: Secondary | ICD-10-CM | POA: Diagnosis not present

## 2020-08-25 DIAGNOSIS — R319 Hematuria, unspecified: Secondary | ICD-10-CM

## 2020-08-25 LAB — URINALYSIS, ROUTINE W REFLEX MICROSCOPIC
Bilirubin Urine: NEGATIVE
Ketones, ur: NEGATIVE
Leukocytes,Ua: NEGATIVE
Nitrite: NEGATIVE
Specific Gravity, Urine: >=1.03 — AB (ref 1.000–1.030)
Total Protein, Urine: 30 — AB
Urine Glucose: NEGATIVE
Urobilinogen, UA: 0.2 (ref 0.0–1.0)
pH: 5.5 (ref 5.0–8.0)

## 2020-08-25 MED ORDER — CEPHALEXIN 500 MG PO CAPS: 500.0000 mg | ORAL_CAPSULE | Freq: Three times a day (TID) | ORAL | 0 refills | Status: DC

## 2020-08-25 NOTE — Addendum Note (Signed)
Addended by: Tor Netters I on: 08/25/2020 02:17 PM   Modules accepted: Orders

## 2020-08-25 NOTE — Progress Notes (Signed)
[name], female   DOB: 01/19/1946, 75 y.o.   MRN: [id number]   Virtual Visit via virtual Note  This visit type was conducted due to national recommendations for restrictions regarding the COVID-19 pandemic (e.g. social distancing).  This format is felt to be most appropriate for this patient at this time.  All issues noted in this document were discussed and addressed.  No physical exam was performed (except for noted visual exam findings with Video Visits).   I connected with Caitlin Bennett by a video enabled telemedicine application and verified that I am speaking with the correct person using two identifiers. Location patient: home Location provider: work Persons participating in the virtual visit: patient, provider  The limitations, risks, security and privacy concerns of performing an evaluation and management service by video and the availability of in person appointments have been discussed. It has also been discussed with the patient that there may be a patient responsible charge related to this service. The patient expressed understanding and agreed to proceed.  Reason for visit: work in appt  HPI: Work in with concerns regarding a possible UTI.  Symptoms started yesterday.  Noticed chills with urination.  Noticed hematuria last pm.  Also reported increased urinary frequency.  Past a blood clot in her urine.  No nausea or vomiting.  No abdominal pain reported.  No increased back pain reported.  Drinking water.  Took one table - sulfa abx.  Symptoms improved.  No vaginal symptoms reported.     ROS: See pertinent positives and negatives per HPI.  Past Medical History:  Diagnosis Date   Blood in the stool    10 yrs ago   Diverticulitis    GERD (gastroesophageal reflux disease)    History of abnormal Pap smear    History of chicken pox    History of colon polyps    History of kidney stones    h/o    Hypercholesterolemia    Hypertension    Lumbosacral  spondylolysis    Seronegative rheumatoid arthritis ([place])     Past Surgical History:  Procedure Laterality Date   COLONOSCOPY     DILATION AND CURETTAGE OF UTERUS     KNEE ARTHROPLASTY Right 05/13/2019   Procedure: COMPUTER ASSISTED TOTAL KNEE ARTHROPLASTY RIGHT;  Surgeon: Dereck Leep, MD;  Location: [facility] ORS;  Service: Orthopedics;  Laterality: Right;   LEG / ANKLE SOFT TISSUE BIOPSY  2011   to confirm arthritis    Family History  Problem Relation Age of Onset   Arthritis Father    Hyperlipidemia Father    Heart disease Father    Hypertension Father    Diabetes Father    Pulmonary fibrosis Father    Arthritis Mother    Heart disease Mother    Hyperlipidemia Mother    Hypertension Mother    Colon cancer Maternal Aunt    Colon cancer Maternal Uncle    Breast cancer Neg Hx     SOCIAL HX: reviewed.    Current Outpatient Medications:    aspirin EC 81 MG tablet, Take 81 mg by mouth daily. Take 2 tablets daily., Disp: , Rfl:    calcium carbonate (TUMS EX) 750 MG chewable tablet, Chew 1 tablet by mouth daily., Disp: , Rfl:    cephALEXin (KEFLEX) 500 MG capsule, Take 1 capsule (500 mg total) by mouth 3 (three) times daily., Disp: 15 capsule, Rfl: 0   diphenhydramine-acetaminophen (TYLENOL PM) 25-500 MG TABS tablet, Take 1 tablet by mouth  at bedtime., Disp: , Rfl:    lisinopril (ZESTRIL) 10 MG tablet, Take 1 tablet (10 mg total) by mouth daily., Disp: 90 tablet, Rfl: 1   meloxicam (MOBIC) 15 MG tablet, Take 15 mg by mouth daily., Disp: , Rfl:    Multiple Vitamin (MULTIVITAMIN WITH MINERALS) TABS tablet, Take 1 tablet by mouth daily., Disp: , Rfl:    Omega-3 Fatty Acids (FISH OIL) 1200 MG CAPS, Take 1,200 mg by mouth daily., Disp: , Rfl:    pantoprazole (PROTONIX) 40 MG tablet, TAKE 1 TABLET DAILY, Disp: 90 tablet, Rfl: 1   Probiotic Product (PROBIOTIC PO), Take 1 capsule by mouth daily., Disp: , Rfl:    simvastatin (ZOCOR) 20 MG tablet, Take 1  tablet (20 mg total) by mouth every evening., Disp: 90 tablet, Rfl: 1   traMADol (ULTRAM) 50 MG tablet, 1-2 po bid prn, Disp: , Rfl:   EXAM:  GENERAL: alert, oriented, appears well and in no acute distress  HEENT: atraumatic, conjunttiva clear, no obvious abnormalities on inspection of external nose and ears  NECK: normal movements of the head and neck  LUNGS: on inspection no signs of respiratory distress, breathing rate appears normal, no obvious gross SOB, gasping or wheezing  CV: no obvious cyanosis  PSYCH/NEURO: pleasant and cooperative, no obvious depression or anxiety, speech and thought processing grossly intact  ASSESSMENT AND PLAN:  Discussed the following assessment and plan:  Problem List Items Addressed This Visit    Calcium oxalate crystals in urine    Recheck urine to confirm if blood clears.  Check KUB.  Consider renal ultrasound.        Relevant Orders   DG Abd 1 View   Dysuria    Noticed dysuria and hematuria as outlined.  Took one abx tablet last pm.  Symptoms improved.  Urinalysis reviewed - 7-10 wbcs, 3-6 rbcs, calcium oxalate crystals.  Treat keflex. Await culture results.  Discussed w/up - given calcium oxalate crystals in urine.        Relevant Orders   Urinalysis, Routine w reflex microscopic   Hematuria    Urinalysis revealed rbc's.  Treat current infection.  Recheck urinalysis to confirm blood clears.  Also check KUB.       Hypertension    Has been well controlled on lisinopril.        Thrombocytosis - Primary    Slightly elevated platelet count.  Recheck cbc with f/u urinalysis.      Relevant Orders   CBC with Differential/Platelet       I discussed the assessment and treatment plan with the patient. The patient was provided an opportunity to ask questions and all were answered. The patient agreed with the plan and demonstrated an understanding of the instructions.   The patient was advised to call back or seek an in-person evaluation  if the symptoms worsen or if the condition fails to improve as anticipated.   Einar Pheasant, MD

## 2020-08-25 NOTE — Telephone Encounter (Signed)
Pt scheduled with Dr Nicki Reaper and added for urine

## 2020-08-26 ENCOUNTER — Telehealth: Payer: Self-pay | Admitting: Internal Medicine

## 2020-08-26 ENCOUNTER — Encounter: Payer: Self-pay | Admitting: Internal Medicine

## 2020-08-26 DIAGNOSIS — R300 Dysuria: Secondary | ICD-10-CM | POA: Insufficient documentation

## 2020-08-26 DIAGNOSIS — R319 Hematuria, unspecified: Secondary | ICD-10-CM | POA: Insufficient documentation

## 2020-08-26 DIAGNOSIS — R82998 Other abnormal findings in urine: Secondary | ICD-10-CM | POA: Insufficient documentation

## 2020-08-26 LAB — URINE CULTURE
MICRO NUMBER:: 11519363
Result:: NO GROWTH
SPECIMEN QUALITY:: ADEQUATE

## 2020-08-26 NOTE — Assessment & Plan Note (Signed)
Noticed dysuria and hematuria as outlined.  Took one abx tablet last pm.  Symptoms improved.  Urinalysis reviewed - 7-10 wbcs, 3-6 rbcs, calcium oxalate crystals.  Treat keflex. Await culture results.  Discussed w/up - given calcium oxalate crystals in urine.

## 2020-08-26 NOTE — Assessment & Plan Note (Signed)
Urinalysis revealed rbc's.  Treat current infection.  Recheck urinalysis to confirm blood clears.  Also check KUB.

## 2020-08-26 NOTE — Telephone Encounter (Signed)
Pt called returning your call 

## 2020-08-26 NOTE — Telephone Encounter (Signed)
LMTCB for clarification. These meds were sent to her mail order.

## 2020-08-26 NOTE — Assessment & Plan Note (Signed)
Has been well controlled on lisinopril.

## 2020-08-26 NOTE — Assessment & Plan Note (Signed)
Slightly elevated platelet count.  Recheck cbc with f/u urinalysis.

## 2020-08-26 NOTE — Telephone Encounter (Signed)
Caitlin Bennett is currently scheduled for labs 09/21/20.  She needs a KUB and some other labs.  Can you please change her lab appt and schedule her for xray - in 7-10 days.  Thanks.

## 2020-08-26 NOTE — Assessment & Plan Note (Signed)
Recheck urine to confirm if blood clears.  Check KUB.  Consider renal ultrasound.

## 2020-08-26 NOTE — Telephone Encounter (Signed)
Patient called in stated that her medication went to the wrong pharmacy and she don't normal pay that much for her medication and she is about to be out of medication  lisinopril (ZESTRIL) 10 MG tablet and simvastatin (ZOCOR) 20 MG tablet

## 2020-08-26 NOTE — Telephone Encounter (Signed)
See my chart message regarding this.

## 2020-08-29 NOTE — Telephone Encounter (Signed)
Called and rescheduled pt.

## 2020-08-30 ENCOUNTER — Other Ambulatory Visit: Payer: Medicare HMO

## 2020-08-31 ENCOUNTER — Other Ambulatory Visit: Payer: Medicare HMO

## 2020-09-01 DIAGNOSIS — L578 Other skin changes due to chronic exposure to nonionizing radiation: Secondary | ICD-10-CM | POA: Diagnosis not present

## 2020-09-01 DIAGNOSIS — Z872 Personal history of diseases of the skin and subcutaneous tissue: Secondary | ICD-10-CM | POA: Diagnosis not present

## 2020-09-01 DIAGNOSIS — D485 Neoplasm of uncertain behavior of skin: Secondary | ICD-10-CM | POA: Diagnosis not present

## 2020-09-01 DIAGNOSIS — L821 Other seborrheic keratosis: Secondary | ICD-10-CM | POA: Diagnosis not present

## 2020-09-01 DIAGNOSIS — L738 Other specified follicular disorders: Secondary | ICD-10-CM | POA: Diagnosis not present

## 2020-09-01 DIAGNOSIS — D225 Melanocytic nevi of trunk: Secondary | ICD-10-CM | POA: Diagnosis not present

## 2020-09-07 ENCOUNTER — Ambulatory Visit (INDEPENDENT_AMBULATORY_CARE_PROVIDER_SITE_OTHER): Payer: Medicare HMO

## 2020-09-07 ENCOUNTER — Other Ambulatory Visit (INDEPENDENT_AMBULATORY_CARE_PROVIDER_SITE_OTHER): Payer: Medicare HMO

## 2020-09-07 ENCOUNTER — Other Ambulatory Visit: Payer: Self-pay

## 2020-09-07 ENCOUNTER — Encounter: Payer: Self-pay | Admitting: Internal Medicine

## 2020-09-07 DIAGNOSIS — R319 Hematuria, unspecified: Secondary | ICD-10-CM | POA: Diagnosis not present

## 2020-09-07 DIAGNOSIS — R300 Dysuria: Secondary | ICD-10-CM

## 2020-09-07 DIAGNOSIS — D75839 Thrombocytosis, unspecified: Secondary | ICD-10-CM | POA: Diagnosis not present

## 2020-09-07 DIAGNOSIS — R82998 Other abnormal findings in urine: Secondary | ICD-10-CM | POA: Diagnosis not present

## 2020-09-07 DIAGNOSIS — M4186 Other forms of scoliosis, lumbar region: Secondary | ICD-10-CM | POA: Diagnosis not present

## 2020-09-07 DIAGNOSIS — K449 Diaphragmatic hernia without obstruction or gangrene: Secondary | ICD-10-CM | POA: Diagnosis not present

## 2020-09-07 LAB — CBC WITH DIFFERENTIAL/PLATELET
Basophils Absolute: 0.1 10*3/uL (ref 0.0–0.1)
Basophils Relative: 0.9 % (ref 0.0–3.0)
Eosinophils Absolute: 0.2 10*3/uL (ref 0.0–0.7)
Eosinophils Relative: 2.4 % (ref 0.0–5.0)
HCT: 43.7 % (ref 36.0–46.0)
Hemoglobin: 14.5 g/dL (ref 12.0–15.0)
Lymphocytes Relative: 30.8 % (ref 12.0–46.0)
Lymphs Abs: 2.1 10*3/uL (ref 0.7–4.0)
MCHC: 33.2 g/dL (ref 30.0–36.0)
MCV: 88.4 fl (ref 78.0–100.0)
Monocytes Absolute: 0.6 10*3/uL (ref 0.1–1.0)
Monocytes Relative: 8.2 % (ref 3.0–12.0)
Neutro Abs: 4 10*3/uL (ref 1.4–7.7)
Neutrophils Relative %: 57.7 % (ref 43.0–77.0)
Platelets: 489 10*3/uL — ABNORMAL HIGH (ref 150.0–400.0)
RBC: 4.95 Mil/uL (ref 3.87–5.11)
RDW: 14.3 % (ref 11.5–15.5)
WBC: 6.9 10*3/uL (ref 4.0–10.5)

## 2020-09-07 LAB — URINALYSIS, ROUTINE W REFLEX MICROSCOPIC
Bilirubin Urine: NEGATIVE
Leukocytes,Ua: NEGATIVE
Nitrite: NEGATIVE
Specific Gravity, Urine: >=1.03 — AB (ref 1.000–1.030)
Total Protein, Urine: 100 — AB
Urine Glucose: NEGATIVE
Urobilinogen, UA: 0.2 (ref 0.0–1.0)
pH: 6 (ref 5.0–8.0)

## 2020-09-08 ENCOUNTER — Other Ambulatory Visit: Payer: Self-pay

## 2020-09-08 MED ORDER — PANTOPRAZOLE SODIUM 40 MG PO TBEC: 40.0000 mg | DELAYED_RELEASE_TABLET | Freq: Every day | ORAL | 2 refills | Status: DC

## 2020-09-21 ENCOUNTER — Other Ambulatory Visit: Payer: Medicare HMO

## 2020-11-18 ENCOUNTER — Other Ambulatory Visit: Payer: Self-pay

## 2020-11-18 ENCOUNTER — Other Ambulatory Visit (INDEPENDENT_AMBULATORY_CARE_PROVIDER_SITE_OTHER): Payer: Medicare HMO

## 2020-11-18 ENCOUNTER — Other Ambulatory Visit: Payer: Self-pay | Admitting: Internal Medicine

## 2020-11-18 DIAGNOSIS — R739 Hyperglycemia, unspecified: Secondary | ICD-10-CM

## 2020-11-18 DIAGNOSIS — D75839 Thrombocytosis, unspecified: Secondary | ICD-10-CM | POA: Diagnosis not present

## 2020-11-18 DIAGNOSIS — E785 Hyperlipidemia, unspecified: Secondary | ICD-10-CM | POA: Diagnosis not present

## 2020-11-18 DIAGNOSIS — I10 Essential (primary) hypertension: Secondary | ICD-10-CM | POA: Diagnosis not present

## 2020-11-18 LAB — BASIC METABOLIC PANEL
BUN: 25 mg/dL — ABNORMAL HIGH (ref 6–23)
CO2: 28 mEq/L (ref 19–32)
Calcium: 9.4 mg/dL (ref 8.4–10.5)
Chloride: 105 mEq/L (ref 96–112)
Creatinine, Ser: 0.81 mg/dL (ref 0.40–1.20)
GFR: 70.62 mL/min (ref >60.00)
Glucose, Bld: 105 mg/dL — ABNORMAL HIGH (ref 70–99)
Potassium: 4.5 mEq/L (ref 3.5–5.1)
Sodium: 140 mEq/L (ref 135–145)

## 2020-11-18 LAB — HEPATIC FUNCTION PANEL
ALT: 18 U/L (ref 0–35)
AST: 21 U/L (ref 0–37)
Albumin: 4.3 g/dL (ref 3.5–5.2)
Alkaline Phosphatase: 75 U/L (ref 39–117)
Bilirubin, Direct: 0.1 mg/dL (ref 0.0–0.3)
Total Bilirubin: 0.4 mg/dL (ref 0.2–1.2)
Total Protein: 7.1 g/dL (ref 6.0–8.3)

## 2020-11-18 LAB — LIPID PANEL
Cholesterol: 156 mg/dL (ref 0–200)
HDL: 38.2 mg/dL — ABNORMAL LOW (ref >39.00)
LDL Cholesterol: 85 mg/dL (ref 0–99)
NonHDL: 117.33
Total CHOL/HDL Ratio: 4
Triglycerides: 162 mg/dL — ABNORMAL HIGH (ref 0.0–149.0)
VLDL: 32.4 mg/dL (ref 0.0–40.0)

## 2020-11-18 LAB — CBC WITH DIFFERENTIAL/PLATELET
Basophils Absolute: 0 10*3/uL (ref 0.0–0.1)
Basophils Relative: 0.8 % (ref 0.0–3.0)
Eosinophils Absolute: 0.1 10*3/uL (ref 0.0–0.7)
Eosinophils Relative: 3.1 % (ref 0.0–5.0)
HCT: 40.6 % (ref 36.0–46.0)
Hemoglobin: 13.5 g/dL (ref 12.0–15.0)
Lymphocytes Relative: 36.1 % (ref 12.0–46.0)
Lymphs Abs: 1.7 10*3/uL (ref 0.7–4.0)
MCHC: 33.2 g/dL (ref 30.0–36.0)
MCV: 87.2 fl (ref 78.0–100.0)
Monocytes Absolute: 0.3 10*3/uL (ref 0.1–1.0)
Monocytes Relative: 7 % (ref 3.0–12.0)
Neutro Abs: 2.6 10*3/uL (ref 1.4–7.7)
Neutrophils Relative %: 53 % (ref 43.0–77.0)
Platelets: 436 10*3/uL — ABNORMAL HIGH (ref 150.0–400.0)
RBC: 4.66 Mil/uL (ref 3.87–5.11)
RDW: 13.6 % (ref 11.5–15.5)
WBC: 4.8 10*3/uL (ref 4.0–10.5)

## 2020-11-18 LAB — TSH: TSH: 2.72 u[IU]/mL (ref 0.35–4.50)

## 2020-11-18 LAB — HEMOGLOBIN A1C: Hgb A1c MFr Bld: 6 % (ref 4.6–6.5)

## 2020-11-18 NOTE — Progress Notes (Signed)
Orders placed for labs

## 2020-11-21 ENCOUNTER — Other Ambulatory Visit: Payer: Self-pay

## 2020-11-21 ENCOUNTER — Encounter: Payer: Self-pay | Admitting: Internal Medicine

## 2020-11-21 ENCOUNTER — Ambulatory Visit (INDEPENDENT_AMBULATORY_CARE_PROVIDER_SITE_OTHER): Payer: Medicare HMO | Admitting: Internal Medicine

## 2020-11-21 VITALS — BP 120/76 | HR 114 | Temp 97.9°F | Ht 60.95 in | Wt 171.4 lb

## 2020-11-21 DIAGNOSIS — Z1231 Encounter for screening mammogram for malignant neoplasm of breast: Secondary | ICD-10-CM | POA: Diagnosis not present

## 2020-11-21 DIAGNOSIS — Z96651 Presence of right artificial knee joint: Secondary | ICD-10-CM

## 2020-11-21 DIAGNOSIS — R739 Hyperglycemia, unspecified: Secondary | ICD-10-CM

## 2020-11-21 DIAGNOSIS — I10 Essential (primary) hypertension: Secondary | ICD-10-CM | POA: Diagnosis not present

## 2020-11-21 DIAGNOSIS — D473 Essential (hemorrhagic) thrombocythemia: Secondary | ICD-10-CM

## 2020-11-21 DIAGNOSIS — R319 Hematuria, unspecified: Secondary | ICD-10-CM

## 2020-11-21 DIAGNOSIS — E785 Hyperlipidemia, unspecified: Secondary | ICD-10-CM

## 2020-11-21 DIAGNOSIS — Z0000 Encounter for general adult medical examination without abnormal findings: Secondary | ICD-10-CM | POA: Diagnosis not present

## 2020-11-21 MED ORDER — MELOXICAM 15 MG PO TABS: ORAL_TABLET | ORAL | 0 refills | Status: DC

## 2020-11-21 NOTE — Progress Notes (Signed)
[name], female   DOB: Nov 14, 1945, 75 y.o.   MRN: [id number]   Subjective:    [name], female    DOB: 03/06/1946, 75 y.o.   MRN: [id number]  HPI This visit occurred during the SARS-CoV-2 public health emergency.  Safety protocols were in place, including screening questions prior to the visit, additional usage of staff PPE, and extensive cleaning of exam room while observing appropriate contact time as indicated for disinfecting solutions.  Patient here for her physical exam.  She recently had UTI.  Treated.  Found to have calcium oxalate crystals in urine.  Had KUB - 7m peripherally calcified density in the right abdomen - may represent gallstone or renal calculus.  Discussed CT to work above and hemoglobinuria.  She declined CT.  No urinary symptoms now.  Tries to stay active.  Walking.  No chest pain or sob reported.  No acid reflux.  No abdominal pain.  Was taking tramadol for her back.  Request refill meloxicam. Discussed risk and trying to use least effective dose.    Past Medical History:  Diagnosis Date  . Blood in the stool    10 yrs ago  . Diverticulitis   . GERD (gastroesophageal reflux disease)   . History of abnormal Pap smear   . History of chicken pox   . History of colon polyps   . History of kidney stones    h/o   . Hypercholesterolemia   . Hypertension   . Lumbosacral spondylolysis   . Seronegative rheumatoid arthritis ([facility]    Past Surgical History:  Procedure Laterality Date  . COLONOSCOPY    . DILATION AND CURETTAGE OF UTERUS    . KNEE ARTHROPLASTY Right 05/13/2019   Procedure: COMPUTER ASSISTED TOTAL KNEE ARTHROPLASTY RIGHT;  Surgeon: HDereck Leep MD;  Location: [facility] ORS;  Service: Orthopedics;  Laterality: Right;  . LEG / ANKLE SOFT TISSUE BIOPSY  2011   to confirm arthritis   Family History  Problem Relation Age of Onset  . Arthritis Father   . Hyperlipidemia Father   . Heart disease Father   . Hypertension Father    . Diabetes Father   . Pulmonary fibrosis Father   . Arthritis Mother   . Heart disease Mother   . Hyperlipidemia Mother   . Hypertension Mother   . Colon cancer Maternal Aunt   . Colon cancer Maternal Uncle   . Breast cancer Neg Hx    Social History   Socioeconomic History  . Marital status: Divorced    Spouse name: Not on file  . Number of children: Not on file  . Years of education: Not on file  . Highest education level: Not on file  Occupational History  . Not on file  Tobacco Use  . Smoking status: Never Smoker  . Smokeless tobacco: Never Used  Vaping Use  . Vaping Use: Never used  Substance and Sexual Activity  . Alcohol use: Yes    Alcohol/week: 0.0 standard drinks    Comment: rare  . Drug use: No  . Sexual activity: Not on file  Other Topics Concern  . Not on file  Social History Narrative  . Not on file   Social Determinants of Health   Financial Resource Strain: Not on file  Food Insecurity: Not on file  Transportation Needs: Not on file  Physical Activity: Not on file  Stress: Not on file  Social Connections: Not on file    Outpatient  Encounter Medications as of 11/21/2020  Medication Sig  . aspirin EC 81 MG tablet Take 81 mg by mouth daily. Take 2 tablets daily.  . calcium carbonate (TUMS EX) 750 MG chewable tablet Chew 1 tablet by mouth daily.  . diphenhydramine-acetaminophen (TYLENOL PM) 25-500 MG TABS tablet Take 1 tablet by mouth at bedtime.  . Multiple Vitamin (MULTIVITAMIN WITH MINERALS) TABS tablet Take 1 tablet by mouth daily.  . Omega-3 Fatty Acids (FISH OIL) 1200 MG CAPS Take 1,200 mg by mouth daily.  . pantoprazole (PROTONIX) 40 MG tablet Take 1 tablet (40 mg total) by mouth daily.  . Probiotic Product (PROBIOTIC PO) Take 1 capsule by mouth daily.  . traMADol (ULTRAM) 50 MG tablet 1-2 po bid prn  . [DISCONTINUED] lisinopril (ZESTRIL) 10 MG tablet Take 1 tablet (10 mg total) by mouth daily.  . [DISCONTINUED] meloxicam (MOBIC) 15 MG tablet  Take 15 mg by mouth daily.  . [DISCONTINUED] simvastatin (ZOCOR) 20 MG tablet Take 1 tablet (20 mg total) by mouth every evening.  Marland Kitchen lisinopril (ZESTRIL) 10 MG tablet Take 1 tablet (10 mg total) by mouth daily.  . meloxicam (MOBIC) 15 MG tablet Take one tablet qod  . simvastatin (ZOCOR) 20 MG tablet Take 1 tablet (20 mg total) by mouth every evening.  . [DISCONTINUED] cephALEXin (KEFLEX) 500 MG capsule Take 1 capsule (500 mg total) by mouth 3 (three) times daily. (Patient not taking: Reported on 11/21/2020)   No facility-administered encounter medications on file as of 11/21/2020.    Review of Systems  Constitutional: Negative for appetite change and unexpected weight change.  HENT: Negative for congestion, sinus pressure and sore throat.   Eyes: Negative for pain and visual disturbance.  Respiratory: Negative for cough, chest tightness and shortness of breath.   Cardiovascular: Negative for chest pain, palpitations and leg swelling.  Gastrointestinal: Negative for abdominal pain, diarrhea, nausea and vomiting.       Some constipation prn.   Genitourinary: Negative for difficulty urinating and dysuria.  Musculoskeletal: Negative for joint swelling and myalgias.  Skin: Negative for color change and rash.  Neurological: Negative for dizziness, light-headedness and headaches.  Hematological: Negative for adenopathy. Does not bruise/bleed easily.  Psychiatric/Behavioral: Negative for agitation and dysphoric mood.       Objective:    Physical Exam Vitals reviewed.  Constitutional:      General: She is not in acute distress.    Appearance: Normal appearance. She is well-developed.  HENT:     Head: Normocephalic and atraumatic.     Right Ear: External ear normal.     Left Ear: External ear normal.  Eyes:     General: No scleral icterus.       Right eye: No discharge.        Left eye: No discharge.     Conjunctiva/sclera: Conjunctivae normal.  Neck:     Thyroid: No thyromegaly.   Cardiovascular:     Rate and Rhythm: Normal rate and regular rhythm.  Pulmonary:     Effort: No tachypnea, accessory muscle usage or respiratory distress.     Breath sounds: Normal breath sounds. No decreased breath sounds or wheezing.  Chest:  Breasts:     Right: No inverted nipple, mass, nipple discharge or tenderness (no axillary adenopathy).     Left: No inverted nipple, mass, nipple discharge or tenderness (no axilarry adenopathy).    Abdominal:     General: Bowel sounds are normal.     Palpations: Abdomen is soft.  Tenderness: There is no abdominal tenderness.  Musculoskeletal:        General: No swelling, tenderness or deformity.     Cervical back: Neck supple. No tenderness.  Lymphadenopathy:     Cervical: No cervical adenopathy.  Skin:    General: Skin is warm.     Findings: No erythema or rash.  Neurological:     Mental Status: She is alert and oriented to person, place, and time.  Psychiatric:        Mood and Affect: Mood normal.        Behavior: Behavior normal.     BP 120/76 (BP Location: Left Arm, Patient Position: Sitting, Cuff Size: Normal)   Pulse (!) 114   Temp 97.9 F (36.6 C) (Oral)   Ht 5' 0.95" (1.548 m)   Wt 171 lb 6.4 oz (77.7 kg)   LMP 07/16/1996   SpO2 96%   BMI 32.44 kg/m  Wt Readings from Last 3 Encounters:  11/21/20 171 lb 6.4 oz (77.7 kg)  08/25/20 170 lb (77.1 kg)  05/24/20 170 lb (77.1 kg)     Lab Results  Component Value Date   WBC 4.8 11/18/2020   HGB 13.5 11/18/2020   HCT 40.6 11/18/2020   PLT 436.0 (H) 11/18/2020   GLUCOSE 105 (H) 11/18/2020   CHOL 156 11/18/2020   TRIG 162.0 (H) 11/18/2020   HDL 38.20 (L) 11/18/2020   LDLDIRECT 153.7 11/15/2010   LDLCALC 85 11/18/2020   ALT 18 11/18/2020   AST 21 11/18/2020   NA 140 11/18/2020   K 4.5 11/18/2020   CL 105 11/18/2020   CREATININE 0.81 11/18/2020   BUN 25 (H) 11/18/2020   CO2 28 11/18/2020   TSH 2.72 11/18/2020   INR 0.9 07/15/2019   HGBA1C 6.0 11/18/2020     MM 3D SCREEN BREAST BILATERAL  Result Date: 01/01/2020 CLINICAL DATA:  Screening. EXAM: DIGITAL SCREENING BILATERAL MAMMOGRAM WITH TOMO AND CAD COMPARISON:  Previous exam(s). ACR Breast Density Category a: The breast tissue is almost entirely fatty. FINDINGS: There are no findings suspicious for malignancy. Images were processed with CAD. IMPRESSION: No mammographic evidence of malignancy. A result letter of this screening mammogram will be mailed directly to the patient. RECOMMENDATION: Screening mammogram in one year. (Code:SM-B-01Y) BI-RADS CATEGORY  1: Negative. Electronically Signed   By: Ammie Ferrier M.D.   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  Still slightly elevated, but improved.  Follow.  Hold on additional w/up       Health care maintenance    Physical today 11/21/20.  Mammogram 01/01/20 - [facility] I.  Colonoscopy 2010.  Due f/u.  Notify when agreeable.       Hematuria    Repeat urinalysis no rbc's.  Hemoglobin present.  Had KUB as outlined.  Declined CT.  Follow.       Hyperglycemia    Low carb diet and exercise.  Follow met b and a1c.        Relevant Orders   Hemoglobin A1c   Hyperlipidemia    Continue simvastatin.  Low cholesterol diet and exercise.  Follow lipid panel and liver function tests.       Relevant Medications   lisinopril (ZESTRIL) 10 MG tablet  simvastatin (ZOCOR) 20 MG tablet   Other Relevant Orders   Hepatic function panel   Lipid panel   Hypertension    Continue lisinopril.  Blood pressure as outlined.  No changes pressures.  Follow metabolic panel.       Relevant Medications   lisinopril (ZESTRIL) 10 MG tablet   simvastatin (ZOCOR) 20 MG tablet   Other Relevant Orders   CBC with Differential/Platelet   Basic metabolic panel    Total knee replacement status    Has been stable.  Follow.        Other Visit Diagnoses    Routine general medical examination at a health care facility    -  Primary       Einar Pheasant, MD

## 2020-11-27 ENCOUNTER — Encounter: Payer: Self-pay | Admitting: Internal Medicine

## 2020-11-27 DIAGNOSIS — D473 Essential (hemorrhagic) thrombocythemia: Secondary | ICD-10-CM | POA: Insufficient documentation

## 2020-11-27 MED ORDER — SIMVASTATIN 20 MG PO TABS: 20.0000 mg | ORAL_TABLET | Freq: Every evening | ORAL | 1 refills | Status: DC

## 2020-11-27 MED ORDER — LISINOPRIL 10 MG PO TABS: 10.0000 mg | ORAL_TABLET | Freq: Every day | ORAL | 1 refills | Status: DC

## 2020-11-27 NOTE — Assessment & Plan Note (Signed)
Continue lisinopril.  Blood pressure as outlined.  No changes pressures.  Follow metabolic panel.

## 2020-11-27 NOTE — Assessment & Plan Note (Signed)
Low carb diet and exercise.  Follow met b and a1c.   

## 2020-11-27 NOTE — Assessment & Plan Note (Signed)
Repeat urinalysis no rbc's.  Hemoglobin present.  Had KUB as outlined.  Declined CT.  Follow.

## 2020-11-27 NOTE — Assessment & Plan Note (Signed)
Continue simvastatin.  Low cholesterol diet and exercise.  Follow lipid panel and liver function tests.   

## 2020-11-27 NOTE — Assessment & Plan Note (Signed)
Has been stable.  Follow.  

## 2020-11-27 NOTE — Assessment & Plan Note (Signed)
Recent platelet count 436  - improved.  Still slightly elevated, but improved.  Follow.  Hold on additional w/up

## 2020-11-27 NOTE — Assessment & Plan Note (Signed)
Physical today 11/21/20.  Mammogram 01/01/20 - [facility] I.  Colonoscopy 2010.  Due f/u.  Notify when agreeable.

## 2020-12-14 DIAGNOSIS — H25043 Posterior subcapsular polar age-related cataract, bilateral: Secondary | ICD-10-CM | POA: Diagnosis not present

## 2021-01-03 ENCOUNTER — Other Ambulatory Visit: Payer: Self-pay

## 2021-01-03 ENCOUNTER — Ambulatory Visit
Admission: RE | Admit: 2021-01-03 | Discharge: 2021-01-03 | Disposition: A | Discharge status: Home / Self Care | Payer: Medicare HMO | Source: Ambulatory Visit | Attending: Internal Medicine | Admitting: Internal Medicine

## 2021-01-03 DIAGNOSIS — Z1231 Encounter for screening mammogram for malignant neoplasm of breast: Secondary | ICD-10-CM | POA: Insufficient documentation

## 2021-02-14 ENCOUNTER — Telehealth: Payer: Self-pay | Admitting: Internal Medicine

## 2021-02-14 NOTE — Telephone Encounter (Signed)
Patient called to schedule an AWVI,patient is scheduled for 02/22/21 at 3:30 pm.

## 2021-02-14 NOTE — Telephone Encounter (Signed)
Left message for patient to call back and schedule Medicare Annual Wellness Visit (AWV) in office.   If not able to come in office, please offer to do virtually or by telephone.   Due for AWVI  Please schedule at anytime with Nurse Health Advisor.   

## 2021-02-22 ENCOUNTER — Ambulatory Visit (INDEPENDENT_AMBULATORY_CARE_PROVIDER_SITE_OTHER): Payer: Medicare HMO

## 2021-02-22 VITALS — Ht 60.95 in | Wt 171.0 lb

## 2021-02-22 DIAGNOSIS — Z0000 Encounter for general adult medical examination without abnormal findings: Secondary | ICD-10-CM | POA: Diagnosis not present

## 2021-02-22 NOTE — Progress Notes (Addendum)
Subjective:   Caitlin Bennett is a 75 y.o. female who presents for an Initial Medicare Annual Wellness Visit.  Review of Systems    No ROS.  Medicare Wellness Virtual Visit.  Visual/audio telehealth visit, UTA vital signs.   See social history for additional risk factors.   Cardiac Risk Factors include: advanced age (>30mn, >>63women)     Objective:    Today's Vitals   02/22/21 1542  Weight: 171 lb (77.6 kg)  Height: 5' 0.95" (1.548 m)   Body mass index is 32.36 kg/m.  Advanced Directives 02/22/2021 07/27/2019 07/15/2019 06/16/2019 05/13/2019 05/13/2019 04/29/2019  Does Patient Have a Medical Advance Directive? Yes Yes Yes Yes - No No  Type of AParamedicof AMatthewsLiving will HGallowayLiving will HGreenLiving will Living will;Healthcare Power of Attorney - - -  Does patient want to make changes to medical advance directive? No - Patient declined No - Patient declined No - Patient declined No - Patient declined - - -  Copy of [place]in Chart? Yes - validated most recent copy scanned in chart (See row information) Yes - validated most recent copy scanned in chart (See row information) Yes - validated most recent copy scanned in chart (See row information) No - copy requested - - -  Would patient like information on creating a medical advance directive? - - - - No - Patient declined - -    Current Medications (verified) Outpatient Encounter Medications as of 02/22/2021  Medication Sig   aspirin EC 81 MG tablet Take 81 mg by mouth daily. Take 2 tablets daily.   calcium carbonate (TUMS EX) 750 MG chewable tablet Chew 1 tablet by mouth daily.   diphenhydramine-acetaminophen (TYLENOL PM) 25-500 MG TABS tablet Take 1 tablet by mouth at bedtime.   lisinopril (ZESTRIL) 10 MG tablet Take 1 tablet (10 mg total) by mouth daily.   meloxicam (MOBIC) 15 MG tablet Take one tablet qod   Multiple Vitamin  (MULTIVITAMIN WITH MINERALS) TABS tablet Take 1 tablet by mouth daily.   Omega-3 Fatty Acids (FISH OIL) 1200 MG CAPS Take 1,200 mg by mouth daily.   pantoprazole (PROTONIX) 40 MG tablet Take 1 tablet (40 mg total) by mouth daily.   Probiotic Product (PROBIOTIC PO) Take 1 capsule by mouth daily.   simvastatin (ZOCOR) 20 MG tablet Take 1 tablet (20 mg total) by mouth every evening.   traMADol (ULTRAM) 50 MG tablet 1-2 po bid prn   No facility-administered encounter medications on file as of 02/22/2021.    Allergies (verified) Sumycin [tetracycline hcl]   History: Past Medical History:  Diagnosis Date   Blood in the stool    10 yrs ago   Diverticulitis    GERD (gastroesophageal reflux disease)    History of abnormal Pap smear    History of chicken pox    History of colon polyps    History of kidney stones    h/o    Hypercholesterolemia    Hypertension    Lumbosacral spondylolysis    Seronegative rheumatoid arthritis ([place]    Past Surgical History:  Procedure Laterality Date   COLONOSCOPY     DILATION AND CURETTAGE OF UTERUS     KNEE ARTHROPLASTY Right 05/13/2019   Procedure: COMPUTER ASSISTED TOTAL KNEE ARTHROPLASTY RIGHT;  Surgeon: HDereck Leep MD;  Location: [facility] ORS;  Service: Orthopedics;  Laterality: Right;   LEG / ANKLE SOFT TISSUE BIOPSY  2011  to confirm arthritis   Family History  Problem Relation Age of Onset   Arthritis Father    Hyperlipidemia Father    Heart disease Father    Hypertension Father    Diabetes Father    Pulmonary fibrosis Father    Arthritis Mother    Heart disease Mother    Hyperlipidemia Mother    Hypertension Mother    Colon cancer Maternal Aunt    Colon cancer Maternal Uncle    Breast cancer Neg Hx    Social History   Socioeconomic History   Marital status: Divorced    Spouse name: Not on file   Number of children: Not on file   Years of education: Not on file   Highest education level: Not on file  Occupational History    Not on file  Tobacco Use   Smoking status: Never   Smokeless tobacco: Never  Vaping Use   Vaping Use: Never used  Substance and Sexual Activity   Alcohol use: Yes    Alcohol/week: 0.0 standard drinks    Comment: rare   Drug use: No   Sexual activity: Not on file  Other Topics Concern   Not on file  Social History Narrative   Not on file   Social Determinants of Health   Financial Resource Strain: Low Risk    Difficulty of Paying Living Expenses: Not hard at all  Food Insecurity: No Food Insecurity   Worried About Charity fundraiser in the Last Year: Never true   [place] in the Last Year: Never true  Transportation Needs: No Transportation Needs   Lack of Transportation (Medical): No   Lack of Transportation (Non-Medical): No  Physical Activity: Sufficiently Active   Days of Exercise per Week: 5 days   Minutes of Exercise per Session: 30 min  Stress: No Stress Concern Present   Feeling of Stress : Not at all  Social Connections: Unknown   Frequency of Communication with Friends and Family: More than three times a week   Frequency of Social Gatherings with Friends and Family: More than three times a week   Attends Religious Services: Not on Electrical engineer or Organizations: Not on file   Attends Archivist Meetings: Not on file   Marital Status: Not on file    Tobacco Counseling Counseling given: Not Answered   Clinical Intake:  Pre-visit preparation completed: Yes        Diabetes: No  How often do you need to have someone help you when you read instructions, pamphlets, or other written materials from your doctor or pharmacy?: 1 - Never    Interpreter Needed?: No      Activities of Daily Living In your present state of health, do you have any difficulty performing the following activities: 02/22/2021  Hearing? N  Vision? N  Difficulty concentrating or making decisions? N  Walking or climbing stairs? N  Dressing or  bathing? N  Doing errands, shopping? N  Preparing Food and eating ? N  Using the Toilet? N  In the past six months, have you accidently leaked urine? N  Do you have problems with loss of bowel control? N  Managing your Medications? N  Managing your Finances? N  Housekeeping or managing your Housekeeping? N  Some recent data might be hidden    Patient Care Team: Einar Pheasant, MD as PCP - General (Internal Medicine)  Indicate any recent Medical Services you may have  received from other than [facility] providers in the past year (date may be approximate).     Assessment:   This is a routine wellness examination for Lannis.  I connected with Trista today by telephone and verified that I am speaking with the correct person using two identifiers. Location patient: home Location provider: work Persons participating in the virtual visit: patient, Marine scientist.    I discussed the limitations, risks, security and privacy concerns of performing an evaluation and management service by telephone and the availability of in person appointments. The patient expressed understanding and verbally consented to this telephonic visit.    Interactive audio and video telecommunications were attempted between this provider and patient, however failed, due to patient having technical difficulties OR patient did not have access to video capability.  We continued and completed visit with audio only.  Some vital signs may be absent or patient reported.   Hearing/Vision screen Hearing Screening - Comments:: Patient is able to hear conversational tones without difficulty.  No issues reported. Vision Screening - Comments:: Wears corrective lenses Annual visits with ophthalmology      Dietary issues and exercise activities discussed: Current Exercise Habits: Home exercise routine, Intensity: Mild Healthy diet  Good water intake   Goals Addressed               This Visit's Progress     Patient Stated      Maintain Healthy Lifestyle (pt-stated)        Stay active Healthy diet Good water intake       Depression Screen PHQ 2/9 Scores 02/22/2021 01/21/2020 09/25/2018 07/25/2017 10/10/2016 05/27/2014 05/25/2013  PHQ - 2 Score 0 0 0 0 0 0 0  PHQ- 9 Score - - 0 - - - -    Fall Risk Fall Risk  02/22/2021 11/21/2020 01/21/2020 09/25/2018 07/25/2017  Falls in the past year? 0 0 0 0 No  Number falls in past yr: 0 0 - - -  Injury with Fall? 0 0 - - -  Follow up Falls evaluation completed Falls evaluation completed Falls evaluation completed - -    FALL RISK PREVENTION PERTAINING TO THE HOME: Adequate lighting in your home to reduce risk of falls? Yes   ASSISTIVE DEVICES UTILIZED TO PREVENT FALLS: Use of a cane, walker or w/c? No   TIMED UP AND GO: Was the test performed? No .   Cognitive Function:  Patient is alert and oriented x3.  Denies difficulty focusing, making decisions, memory loss.  Enjoys reading and other brain health activities.  MMSE/6CIT deferred. Normal by direct communication/observation.      Immunizations Immunization History  Administered Date(s) Administered   Fluad Quad(high Dose 65+) 04/01/2019   Influenza Split 04/25/2011, 04/08/2012, 05/24/2014   Influenza, High Dose Seasonal PF 04/17/2016, 03/25/2017, 04/08/2018, 04/01/2019, 04/20/2020   Influenza,inj,Quad PF,6+ Mos 04/16/2013, 06/01/2015   Influenza-Unspecified 04/09/2015   Moderna Sars-Covid-2 Vaccination 08/14/2019, 09/11/2019, 05/20/2020, 12/02/2020   Pneumococcal Conjugate-13 07/25/2017   Pneumococcal Polysaccharide-23 03/17/2013   Tdap 05/04/2009, 02/03/2015   Zoster Recombinat (Shingrix) 04/25/2018, 06/26/2018   Zoster, Live 01/03/2007    Health Maintenance Health Maintenance  Topic Date Due   INFLUENZA VACCINE  04/10/2021 (Originally 02/13/2021)   COVID-19 Vaccine (5 - Booster for Moderna series) 04/04/2021   MAMMOGRAM  01/03/2022   TETANUS/TDAP  02/02/2025   DEXA SCAN  Completed   Hepatitis C  Screening  Completed   PNA vac Low Risk Adult  Completed   Zoster Vaccines- Shingrix  Completed  HPV VACCINES  Aged Out   Lung Cancer Screening: (Low Dose CT Chest recommended if Age 80-80 years, 30 pack-year currently smoking OR have quit w/in 15years.) does not qualify.   Vision Screening: Recommended annual ophthalmology exams for early detection of glaucoma and other disorders of the eye.  Dental Screening: Recommended annual dental exams for proper oral hygiene  Community Resource Referral / Chronic Care Management: CRR required this visit?  No   CCM required this visit?  No      Plan:     I have personally reviewed and noted the following in the patient's chart:   Medical and social history Use of alcohol, tobacco or illicit drugs  Current medications and supplements including opioid prescriptions. Patient is currently taking opioid prescriptions. Information provided to patient regarding non-opioid alternatives. Patient advised to discuss non-opioid treatment plan with their provider. Followed by pcp. Functional ability and status Nutritional status Physical activity Advanced directives List of other physicians Hospitalizations, surgeries, and ER visits in previous 12 months Vitals Screenings to include cognitive, depression, and falls Referrals and appointments  In addition, I have reviewed and discussed with patient certain preventive protocols, quality metrics, and best practice recommendations. A written personalized care plan for preventive services as well as general preventive health recommendations were provided to patient via mychart.     OBrien-Blaney, Fahad Cisse L, LPN   [id number]     I have reviewed the above information and agree with above.   Deborra Medina, MD

## 2021-02-22 NOTE — Patient Instructions (Addendum)
Caitlin Bennett , Thank you for taking time to come for your Medicare Wellness Visit. I appreciate your ongoing commitment to your health goals. Please review the following plan we discussed and let me know if I can assist you in the future.   These are the goals we discussed:  Goals       Patient Stated     Maintain Healthy Lifestyle (pt-stated)      Stay active Healthy diet Good water intake        This is a list of the screening recommended for you and due dates:  Health Maintenance  Topic Date Due   Flu Shot  04/10/2021*   COVID-19 Vaccine (5 - Booster for Moderna series) 04/04/2021   Mammogram  01/03/2022   Tetanus Vaccine  02/02/2025   DEXA scan (bone density measurement)  Completed   Hepatitis C Screening: USPSTF Recommendation to screen - Ages 74-79 yo.  Completed   Pneumonia vaccines  Completed   Zoster (Shingles) Vaccine  Completed   HPV Vaccine  Aged Out  *Topic was postponed. The date shown is not the original due date.    Advanced directives: on file  Conditions/risks identified: none new  Follow up in one year for your annual wellness visit    Preventive Care 65 Years and Older, Female Preventive care refers to lifestyle choices and visits with your health care provider that can promote health and wellness. What does preventive care include? A yearly physical exam. This is also called an annual well check. Dental exams once or twice a year. Routine eye exams. Ask your health care provider how often you should have your eyes checked. Personal lifestyle choices, including: Daily care of your teeth and gums. Regular physical activity. Eating a healthy diet. Avoiding tobacco and drug use. Limiting alcohol use. Practicing safe sex. Taking low-dose aspirin every day. Taking vitamin and mineral supplements as recommended by your health care provider. What happens during an annual well check? The services and screenings done by your health care provider during  your annual well check will depend on your age, overall health, lifestyle risk factors, and family history of disease. Counseling  Your health care provider may ask you questions about your: Alcohol use. Tobacco use. Drug use. Emotional well-being. Home and relationship well-being. Sexual activity. Eating habits. History of falls. Memory and ability to understand (cognition). Work and work Statistician. Reproductive health. Screening  You may have the following tests or measurements: Height, weight, and BMI. Blood pressure. Lipid and cholesterol levels. These may be checked every 5 years, or more frequently if you are over 46 years old. Skin check. Lung cancer screening. You may have this screening every year starting at age 71 if you have a 30-pack-year history of smoking and currently smoke or have quit within the past 15 years. Fecal occult blood test (FOBT) of the stool. You may have this test every year starting at age 6. Flexible sigmoidoscopy or colonoscopy. You may have a sigmoidoscopy every 5 years or a colonoscopy every 10 years starting at age 80. Hepatitis C blood test. Hepatitis B blood test. Sexually transmitted disease (STD) testing. Diabetes screening. This is done by checking your blood sugar (glucose) after you have not eaten for a while (fasting). You may have this done every 1-3 years. Bone density scan. This is done to screen for osteoporosis. You may have this done starting at age 32. Mammogram. This may be done every 1-2 years. Talk to your health care provider about  how often you should have regular mammograms. Talk with your health care provider about your test results, treatment options, and if necessary, the need for more tests. Vaccines  Your health care provider may recommend certain vaccines, such as: Influenza vaccine. This is recommended every year. Tetanus, diphtheria, and acellular pertussis (Tdap, Td) vaccine. You may need a Td booster every 10  years. Zoster vaccine. You may need this after age 21. Pneumococcal 13-valent conjugate (PCV13) vaccine. One dose is recommended after age 2. Pneumococcal polysaccharide (PPSV23) vaccine. One dose is recommended after age 59. Talk to your health care provider about which screenings and vaccines you need and how often you need them. This information is not intended to replace advice given to you by your health care provider. Make sure you discuss any questions you have with your health care provider. Document Released: 07/29/2015 Document Revised: 03/21/2016 Document Reviewed: 05/03/2015 Elsevier Interactive Patient Education  2017 [place] Prevention in the Home Falls can cause injuries. They can happen to people of all ages. There are many things you can do to make your home safe and to help prevent falls. What can I do on the outside of my home? Regularly fix the edges of walkways and driveways and fix any cracks. Remove anything that might make you trip as you walk through a door, such as a raised step or threshold. Trim any bushes or trees on the path to your home. Use bright outdoor lighting. Clear any walking paths of anything that might make someone trip, such as rocks or tools. Regularly check to see if handrails are loose or broken. Make sure that both sides of any steps have handrails. Any raised decks and porches should have guardrails on the edges. Have any leaves, snow, or ice cleared regularly. Use sand or salt on walking paths during winter. Clean up any spills in your garage right away. This includes oil or grease spills. What can I do in the bathroom? Use night lights. Install grab bars by the toilet and in the tub and shower. Do not use towel bars as grab bars. Use non-skid mats or decals in the tub or shower. If you need to sit down in the shower, use a plastic, non-slip stool. Keep the floor dry. Clean up any water that spills on the floor as soon as it  happens. Remove soap buildup in the tub or shower regularly. Attach bath mats securely with double-sided non-slip rug tape. Do not have throw rugs and other things on the floor that can make you trip. What can I do in the bedroom? Use night lights. Make sure that you have a light by your bed that is easy to reach. Do not use any sheets or blankets that are too big for your bed. They should not hang down onto the floor. Have a firm chair that has side arms. You can use this for support while you get dressed. Do not have throw rugs and other things on the floor that can make you trip. What can I do in the kitchen? Clean up any spills right away. Avoid walking on wet floors. Keep items that you use a lot in easy-to-reach places. If you need to reach something above you, use a strong step stool that has a grab bar. Keep electrical cords out of the way. Do not use floor polish or wax that makes floors slippery. If you must use wax, use non-skid floor wax. Do not have throw rugs and other  things on the floor that can make you trip. What can I do with my stairs? Do not leave any items on the stairs. Make sure that there are handrails on both sides of the stairs and use them. Fix handrails that are broken or loose. Make sure that handrails are as long as the stairways. Check any carpeting to make sure that it is firmly attached to the stairs. Fix any carpet that is loose or worn. Avoid having throw rugs at the top or bottom of the stairs. If you do have throw rugs, attach them to the floor with carpet tape. Make sure that you have a light switch at the top of the stairs and the bottom of the stairs. If you do not have them, ask someone to add them for you. What else can I do to help prevent falls? Wear shoes that: Do not have high heels. Have rubber bottoms. Are comfortable and fit you well. Are closed at the toe. Do not wear sandals. If you use a stepladder: Make sure that it is fully opened.  Do not climb a closed stepladder. Make sure that both sides of the stepladder are locked into place. Ask someone to hold it for you, if possible. Clearly mark and make sure that you can see: Any grab bars or handrails. First and last steps. Where the edge of each step is. Use tools that help you move around (mobility aids) if they are needed. These include: Canes. Walkers. Scooters. Crutches. Turn on the lights when you go into a dark area. Replace any light bulbs as soon as they burn out. Set up your furniture so you have a clear path. Avoid moving your furniture around. If any of your floors are uneven, fix them. If there are any pets around you, be aware of where they are. Review your medicines with your doctor. Some medicines can make you feel dizzy. This can increase your chance of falling. Ask your doctor what other things that you can do to help prevent falls. This information is not intended to replace advice given to you by your health care provider. Make sure you discuss any questions you have with your health care provider. Document Released: 04/28/2009 Document Revised: 12/08/2015 Document Reviewed: 08/06/2014 Elsevier Interactive Patient Education  2017 [place].  Opioid Pain Medicine Management Opioid pain medicines are strong medicines that are used to treat bad or very bad pain. When you take them for a short time, they can help you: Sleep better. Do better in physical therapy. Feel better during the first few days after you get hurt. Recover from surgery. Only take these medicines if a doctor says that you can. You should only take them for a short time. This is because opioids can be hard to stop taking (they are addictive). The longer you take opioids, the harder it may be to stop taking them (opioid use disorder). What are the risks? Opioids can cause problems (side effects). Taking them for more than 3 days raises your chance of problems, such as: Trouble  pooping (constipation). Feeling sick to your stomach (nausea). Vomiting. Feeling very sleepy. Confusion. Not being able to stop taking the medicine. Breathing problems. Taking opioids for a long time can make it hard for you to do daily tasks. It can also put you at risk for: Car accidents. Depression. Suicide. Heart attack. Taking too much of the medicine (overdose), which can sometimes lead to death. What is a pain treatment plan? A pain treatment plan  is a plan made by you and your doctor. Work with your doctor to make a plan for treating your pain. To help you do this: Talk about the goals of your treatment, including: How much pain you might expect to have. How you will manage the pain. Talk about the risks and benefits of taking these medicines for your condition. Remember that a good treatment plan uses more than one approach and lowers the risks of side effects. Tell your doctor about the amount of medicines you take and about any drug or alcohol use. Get your pain medicine prescriptions from only one doctor. Pain can be managed with other treatments. Work with your doctor to find other ways to help your pain, such as: Physical therapy. Counseling. Eating healthy foods. Brain exercises. Massage. Meditation. Other pain medicines. Doing gentle exercises. Tapering your use of opioids If you have been taking opioids for more than a few weeks, you may need to slowly decrease (taper) how much you take until you stop taking them. Doing this can lower your chance of having symptoms, such as: Pain and cramping in your belly (abdomen). Feeling sick to your stomach. Sweating. Feeling very sleepy. Feeling restless. Shaking you cannot control (tremors). Cravings for the medicine. Do not try to stop taking them by yourself. Work with your doctor to stop. Your doctorwill help you take less until you are not taking the medicine at all. Follow these instructions at home: Safety and  storage  While you are taking opioids: Do not drive. Do not use machines or power tools. Do not sign important papers (legal documents). Do not drink alcohol. Do not take sleeping pills. Do not take care of children by yourself. Do not do activities where you need to climb or be in high places, like working on a ladder. Do not go into any water, such as a lake, river, ocean, swimming pool, or hot tub. Keep your opioids locked up or in a place where children cannot reach them. Do not share your pain medicine with anyone.  Getting rid of leftover pills Do not save any leftover pills. Get rid of leftover pills safely by: Taking them to a take-back program in your area. Bringing them to a pharmacy that has a container for throwing away pills (pill disposal). Flushing them down the toilet. Check the label or package insert of your medicine to see whether this is safe to do. Throwing them in the trash. Check the label or package insert of your medicine to see whether this is safe to do. If it is safe to throw them out: Take the pills out of their container. Put the pills into a container you can seal. Mix the pills with used coffee grounds, food scraps, dirt, or cat litter. Put this in the trash. Activity Return to your normal activities as told by your doctor. Ask your doctor what activities are safe for you. Avoid doing things that make your pain worse. Do exercises as told by your doctor. General instructions You may need to take these actions to prevent or treat trouble pooping: Drink enough fluid to keep your pee (urine) pale yellow. Take over-the-counter or prescription medicines. Eat foods that are high in fiber. These include beans, whole grains, and fresh fruits and vegetables. Limit foods that are high in fat and sugar. These include fried or sweet foods. Keep all follow-up visits as told by your doctor. This is important. Where to find support If you have been taking opioids  for  a long time, think about getting helpquitting from a local support group or counselor. Ask your doctor about this. Where to find more information Centers for Disease Control and Prevention (CDC): http://www.wolf.info/ [place] Food and Drug Administration (FDA): [place] Get help right away if: Seek medical care right away if you are taking opioids and you, or people close to you, notice any of the following: You have trouble breathing. Your breathing is slower or more shallow than normal. You have a very slow heartbeat. You feel very confused. You pass out (faint). You are very sleepy. Your speech is not normal. You feel sick to your stomach and vomit. You have cold skin. You have blue lips or fingernails. Your muscles are weak (limp) and your body seems floppy. The black centers of your eyes (pupils) are smaller than normal. If you think that you or someone else may have taken too much of an opioid medicine, get medical help right away. Call your local emergency services (911 in the [place] Do not drive yourself to the hospital. If you ever feel like you may hurt yourself or others, or have thoughts about taking your own life, get help right away. You can go to your nearest emergency department or call: Your local emergency services (911 in the [place] The hotline of the [facility] 302-055-2675 in the [place] A suicide crisis helpline, such as the [facility] at 9068430182. This is open 24 hours a day. Summary Opioid are strong medicines that are used to treat bad or very bad pain. A pain treatment plan is a plan made by you and your doctor. Work with your doctor to make a plan for treating your pain. Work with your doctor to find other ways to help your pain. If you think that you or someone else may have taken too much of an opioid, get help right away. This information is not intended to replace advice given to you by your health care  provider. Make sure you discuss any questions you have with your healthcare provider. Document Revised: 06/24/2020 Document Reviewed: 08/01/2018 Elsevier Patient Education  [place].

## 2021-02-28 ENCOUNTER — Ambulatory Visit: Payer: Medicare HMO | Admitting: Dermatology

## 2021-03-07 DIAGNOSIS — Z86018 Personal history of other benign neoplasm: Secondary | ICD-10-CM | POA: Diagnosis not present

## 2021-03-07 DIAGNOSIS — L821 Other seborrheic keratosis: Secondary | ICD-10-CM | POA: Diagnosis not present

## 2021-03-22 ENCOUNTER — Other Ambulatory Visit (INDEPENDENT_AMBULATORY_CARE_PROVIDER_SITE_OTHER): Payer: Medicare HMO

## 2021-03-22 ENCOUNTER — Other Ambulatory Visit: Payer: Self-pay

## 2021-03-22 DIAGNOSIS — R739 Hyperglycemia, unspecified: Secondary | ICD-10-CM | POA: Diagnosis not present

## 2021-03-22 DIAGNOSIS — E785 Hyperlipidemia, unspecified: Secondary | ICD-10-CM | POA: Diagnosis not present

## 2021-03-22 DIAGNOSIS — I10 Essential (primary) hypertension: Secondary | ICD-10-CM

## 2021-03-22 LAB — HEPATIC FUNCTION PANEL
ALT: 25 U/L (ref 0–35)
AST: 24 U/L (ref 0–37)
Albumin: 4.2 g/dL (ref 3.5–5.2)
Alkaline Phosphatase: 68 U/L (ref 39–117)
Bilirubin, Direct: 0.1 mg/dL (ref 0.0–0.3)
Total Bilirubin: 0.5 mg/dL (ref 0.2–1.2)
Total Protein: 7 g/dL (ref 6.0–8.3)

## 2021-03-22 LAB — BASIC METABOLIC PANEL
BUN: 23 mg/dL (ref 6–23)
CO2: 25 mEq/L (ref 19–32)
Calcium: 9.4 mg/dL (ref 8.4–10.5)
Chloride: 104 mEq/L (ref 96–112)
Creatinine, Ser: 0.79 mg/dL (ref 0.40–1.20)
GFR: 72.6 mL/min (ref >60.00)
Glucose, Bld: 100 mg/dL — ABNORMAL HIGH (ref 70–99)
Potassium: 4.4 mEq/L (ref 3.5–5.1)
Sodium: 139 mEq/L (ref 135–145)

## 2021-03-22 LAB — HEMOGLOBIN A1C: Hgb A1c MFr Bld: 6.3 % (ref 4.6–6.5)

## 2021-03-22 LAB — CBC WITH DIFFERENTIAL/PLATELET
Basophils Absolute: 0.1 10*3/uL (ref 0.0–0.1)
Basophils Relative: 1.1 % (ref 0.0–3.0)
Eosinophils Absolute: 0.2 10*3/uL (ref 0.0–0.7)
Eosinophils Relative: 3 % (ref 0.0–5.0)
HCT: 40.4 % (ref 36.0–46.0)
Hemoglobin: 13.5 g/dL (ref 12.0–15.0)
Lymphocytes Relative: 31.3 % (ref 12.0–46.0)
Lymphs Abs: 1.9 10*3/uL (ref 0.7–4.0)
MCHC: 33.4 g/dL (ref 30.0–36.0)
MCV: 85.6 fl (ref 78.0–100.0)
Monocytes Absolute: 0.4 10*3/uL (ref 0.1–1.0)
Monocytes Relative: 6.1 % (ref 3.0–12.0)
Neutro Abs: 3.5 10*3/uL (ref 1.4–7.7)
Neutrophils Relative %: 58.5 % (ref 43.0–77.0)
Platelets: 451 10*3/uL — ABNORMAL HIGH (ref 150.0–400.0)
RBC: 4.73 Mil/uL (ref 3.87–5.11)
RDW: 15.6 % — ABNORMAL HIGH (ref 11.5–15.5)
WBC: 6 10*3/uL (ref 4.0–10.5)

## 2021-03-22 LAB — LIPID PANEL
Cholesterol: 148 mg/dL (ref 0–200)
HDL: 37.6 mg/dL — ABNORMAL LOW (ref >39.00)
LDL Cholesterol: 82 mg/dL (ref 0–99)
NonHDL: 110.14
Total CHOL/HDL Ratio: 4
Triglycerides: 140 mg/dL (ref 0.0–149.0)
VLDL: 28 mg/dL (ref 0.0–40.0)

## 2021-03-24 ENCOUNTER — Ambulatory Visit (INDEPENDENT_AMBULATORY_CARE_PROVIDER_SITE_OTHER): Payer: Medicare HMO | Admitting: Internal Medicine

## 2021-03-24 ENCOUNTER — Encounter: Payer: Self-pay | Admitting: Internal Medicine

## 2021-03-24 ENCOUNTER — Other Ambulatory Visit: Payer: Self-pay

## 2021-03-24 VITALS — BP 130/80 | HR 95 | Temp 97.8°F | Resp 16 | Ht 61.0 in | Wt 173.2 lb

## 2021-03-24 DIAGNOSIS — I10 Essential (primary) hypertension: Secondary | ICD-10-CM

## 2021-03-24 DIAGNOSIS — Z96651 Presence of right artificial knee joint: Secondary | ICD-10-CM

## 2021-03-24 DIAGNOSIS — K649 Unspecified hemorrhoids: Secondary | ICD-10-CM

## 2021-03-24 DIAGNOSIS — Z23 Encounter for immunization: Secondary | ICD-10-CM

## 2021-03-24 DIAGNOSIS — E785 Hyperlipidemia, unspecified: Secondary | ICD-10-CM

## 2021-03-24 DIAGNOSIS — D473 Essential (hemorrhagic) thrombocythemia: Secondary | ICD-10-CM

## 2021-03-24 DIAGNOSIS — R739 Hyperglycemia, unspecified: Secondary | ICD-10-CM | POA: Diagnosis not present

## 2021-03-24 DIAGNOSIS — Z800 Family history of malignant neoplasm of digestive organs: Secondary | ICD-10-CM

## 2021-03-24 MED ORDER — HYDROCORT-PRAMOXINE (PERIANAL) 2.5-1 % EX CREA: 1.0000 "application " | TOPICAL_CREAM | Freq: Two times a day (BID) | CUTANEOUS | 0 refills | Status: DC | PRN

## 2021-03-24 MED ORDER — HYDROCORTISONE ACETATE 25 MG RE SUPP: 25.0000 mg | Freq: Two times a day (BID) | RECTAL | 0 refills | Status: DC

## 2021-03-24 NOTE — Progress Notes (Signed)
[name], female   DOB: [birth year], 75 y.o.   MRN: [id number]   Subjective:    [name], female    DOB: [birth year], 75 y.o.   MRN: [id number]  This visit occurred during the SARS-CoV-2 public health emergency.  Safety protocols were in place, including screening questions prior to the visit, additional usage of staff PPE, and extensive cleaning of exam room while observing appropriate contact time as indicated for disinfecting solutions.   Patient here for  Chief Complaint  Patient presents with   Hypertension   Hyperlipidemia   Gastroesophageal Reflux   .   HPI Reports she is doing relatively well.  Tries to stay active.  Is walking.  No chest pain or sob reported.  No abdominal pain.  Bowels moving.  No urine change.  Taking tramadol q day.  Some constipation with increased doses.  Discussed diet and exercise.  Having problems with hemorrhoids.  Some occasional straining and when more difficult - may noticed minimal blood.  Discussed treatment.    Past Medical History:  Diagnosis Date   Blood in the stool    10 yrs ago   Diverticulitis    GERD (gastroesophageal reflux disease)    History of abnormal Pap smear    History of chicken pox    History of colon polyps    History of kidney stones    h/o    Hypercholesterolemia    Hypertension    Lumbosacral spondylolysis    Seronegative rheumatoid arthritis ([place])    Past Surgical History:  Procedure Laterality Date   COLONOSCOPY     DILATION AND CURETTAGE OF UTERUS     KNEE ARTHROPLASTY Right 05/13/2019   Procedure: COMPUTER ASSISTED TOTAL KNEE ARTHROPLASTY RIGHT;  Surgeon: Dereck Leep, MD;  Location: [facility] ORS;  Service: Orthopedics;  Laterality: Right;   LEG / ANKLE SOFT TISSUE BIOPSY  2011   to confirm arthritis   Family History  Problem Relation Age of Onset   Arthritis Father    Hyperlipidemia Father    Heart disease Father    Hypertension Father    Diabetes Father    Pulmonary  fibrosis Father    Arthritis Mother    Heart disease Mother    Hyperlipidemia Mother    Hypertension Mother    Colon cancer Maternal Aunt    Colon cancer Maternal Uncle    Breast cancer Neg Hx    Social History   Socioeconomic History   Marital status: Divorced    Spouse name: Not on file   Number of children: Not on file   Years of education: Not on file   Highest education level: Not on file  Occupational History   Not on file  Tobacco Use   Smoking status: Never   Smokeless tobacco: Never  Vaping Use   Vaping Use: Never used  Substance and Sexual Activity   Alcohol use: Yes    Alcohol/week: 0.0 standard drinks    Comment: rare   Drug use: No   Sexual activity: Not on file  Other Topics Concern   Not on file  Social History Narrative   Not on file   Social Determinants of Health   Financial Resource Strain: Low Risk    Difficulty of Paying Living Expenses: Not hard at all  Food Insecurity: No Food Insecurity   Worried About [place] in the Last Year: Never true   [place] in the Last  Year: Never true  Transportation Needs: No Transportation Needs   Lack of Transportation (Medical): No   Lack of Transportation (Non-Medical): No  Physical Activity: Sufficiently Active   Days of Exercise per Week: 5 days   Minutes of Exercise per Session: 30 min  Stress: No Stress Concern Present   Feeling of Stress : Not at all  Social Connections: Unknown   Frequency of Communication with Friends and Family: More than three times a week   Frequency of Social Gatherings with Friends and Family: More than three times a week   Attends Religious Services: Not on Electrical engineer or Organizations: Not on file   Attends Archivist Meetings: Not on file   Marital Status: Not on file    Review of Systems  Constitutional:  Negative for appetite change and unexpected weight change.  HENT:  Negative for congestion and sinus pressure.    Respiratory:  Negative for cough, chest tightness and shortness of breath.   Cardiovascular:  Negative for chest pain, palpitations and leg swelling.  Gastrointestinal:  Negative for abdominal pain, diarrhea, nausea and vomiting.  Genitourinary:  Negative for difficulty urinating and dysuria.  Musculoskeletal:  Negative for joint swelling and myalgias.  Skin:  Negative for color change and rash.  Neurological:  Negative for dizziness, light-headedness and headaches.  Psychiatric/Behavioral:  Negative for agitation and dysphoric mood.       Objective:     BP 130/80   Pulse 95   Temp 97.8 F (36.6 C)   Resp 16   Ht '5\' 1"'  (1.549 m)   Wt 173 lb 3.2 oz (78.6 kg)   LMP 07/16/1996   SpO2 98%   BMI 32.73 kg/m  Wt Readings from Last 3 Encounters:  03/24/21 173 lb 3.2 oz (78.6 kg)  02/22/21 171 lb (77.6 kg)  11/21/20 171 lb 6.4 oz (77.7 kg)    Physical Exam Vitals reviewed.  Constitutional:      General: She is not in acute distress.    Appearance: Normal appearance.  HENT:     Head: Normocephalic and atraumatic.     Right Ear: External ear normal.     Left Ear: External ear normal.  Eyes:     General: No scleral icterus.       Right eye: No discharge.        Left eye: No discharge.     Conjunctiva/sclera: Conjunctivae normal.  Neck:     Thyroid: No thyromegaly.  Cardiovascular:     Rate and Rhythm: Normal rate and regular rhythm.  Pulmonary:     Effort: No respiratory distress.     Breath sounds: Normal breath sounds. No wheezing.  Abdominal:     General: Bowel sounds are normal.     Palpations: Abdomen is soft.     Tenderness: There is no abdominal tenderness.  Musculoskeletal:        General: No swelling or tenderness.     Cervical back: Neck supple. No tenderness.  Lymphadenopathy:     Cervical: No cervical adenopathy.  Skin:    Findings: No erythema or rash.  Neurological:     Mental Status: She is alert.  Psychiatric:        Mood and Affect: Mood normal.         Behavior: Behavior normal.     Outpatient Encounter Medications as of 03/24/2021  Medication Sig   hydrocortisone (ANUSOL-HC) 25 MG suppository Place 1 suppository (25 mg total) rectally 2 (  two) times daily.   hydrocortisone-pramoxine (ANALPRAM HC) 2.5-1 % rectal cream Place 1 application rectally 2 (two) times daily as needed for hemorrhoids or anal itching.   aspirin EC 81 MG tablet Take 81 mg by mouth daily. Take 2 tablets daily.   calcium carbonate (TUMS EX) 750 MG chewable tablet Chew 1 tablet by mouth daily.   diphenhydramine-acetaminophen (TYLENOL PM) 25-500 MG TABS tablet Take 1 tablet by mouth at bedtime.   lisinopril (ZESTRIL) 10 MG tablet Take 1 tablet (10 mg total) by mouth daily.   meloxicam (MOBIC) 15 MG tablet Take one tablet qod   Multiple Vitamin (MULTIVITAMIN WITH MINERALS) TABS tablet Take 1 tablet by mouth daily.   Omega-3 Fatty Acids (FISH OIL) 1200 MG CAPS Take 1,200 mg by mouth daily.   pantoprazole (PROTONIX) 40 MG tablet Take 1 tablet (40 mg total) by mouth daily.   Probiotic Product (PROBIOTIC PO) Take 1 capsule by mouth daily.   simvastatin (ZOCOR) 20 MG tablet Take 1 tablet (20 mg total) by mouth every evening.   traMADol (ULTRAM) 50 MG tablet 1-2 po bid prn   No facility-administered encounter medications on file as of 03/24/2021.     Lab Results  Component Value Date   WBC 6.0 03/22/2021   HGB 13.5 03/22/2021   HCT 40.4 03/22/2021   PLT 451.0 (H) 03/22/2021   GLUCOSE 100 (H) 03/22/2021   CHOL 148 03/22/2021   TRIG 140.0 03/22/2021   HDL 37.60 (L) 03/22/2021   LDLDIRECT 153.7 11/15/2010   LDLCALC 82 03/22/2021   ALT 25 03/22/2021   AST 24 03/22/2021   NA 139 03/22/2021   K 4.4 03/22/2021   CL 104 03/22/2021   CREATININE 0.79 03/22/2021   BUN 23 03/22/2021   CO2 25 03/22/2021   TSH 2.72 11/18/2020   INR 0.9 07/15/2019   HGBA1C 6.3 03/22/2021    MM 3D SCREEN BREAST BILATERAL  Result Date: 01/04/2021 CLINICAL DATA:  Screening. EXAM:  DIGITAL SCREENING BILATERAL MAMMOGRAM WITH TOMOSYNTHESIS AND CAD TECHNIQUE: Bilateral screening digital craniocaudal and mediolateral oblique mammograms were obtained. Bilateral screening digital breast tomosynthesis was performed. The images were evaluated with computer-aided detection. COMPARISON:  Previous exam(s). ACR Breast Density Category a: The breast tissue is almost entirely fatty. FINDINGS: There are no findings suspicious for malignancy. IMPRESSION: No mammographic evidence of malignancy. A result letter of this screening mammogram will be mailed directly to the patient. RECOMMENDATION: Screening mammogram in one year. (Code:SM-B-01Y) BI-RADS CATEGORY  1: Negative. Electronically Signed   By: Valentino Saxon MD   On: 01/04/2021 15:01      Assessment & Plan:   Problem List Items Addressed This Visit     Essential thrombocytosis ([place])    Minimal elevation.  Has seen hematology.  Recommended continued f/u.  Follow cbc to confirm stable.       Relevant Orders   CBC with Differential/Platelet   Family history of colon cancer    Saw GI (Dr Alice Reichert).  Had recent colonoscopy.  Obtain results.       Hemorrhoid    Hemorrhoid issue as outlined.  Declines for me to evaluate.  Had recent colonoscopy.  Obtain results.  anusol HC suppositories.  analpram if needed.  Follow.        Hyperglycemia    Low carb diet and exercise.  Follow met b and a1c.        Relevant Orders   Hemoglobin A1c   Hyperlipidemia    Continue simvastatin.  Low cholesterol diet  and exercise.  Follow lipid panel and liver function tests.       Relevant Orders   Hepatic function panel   Lipid panel   Hypertension    Continue lisinopril.  Blood pressure as outlined.  No changes pressures.  Follow metabolic panel.       Relevant Orders   Basic metabolic panel   Total knee replacement status    Stable.  Follow.       Other Visit Diagnoses     Need for immunization against influenza    -  Primary    Relevant Orders   Flu Vaccine QUAD High Dose(Fluad) (Completed)        Einar Pheasant, MD

## 2021-03-25 ENCOUNTER — Encounter: Payer: Self-pay | Admitting: Internal Medicine

## 2021-03-25 DIAGNOSIS — K649 Unspecified hemorrhoids: Secondary | ICD-10-CM | POA: Insufficient documentation

## 2021-03-25 NOTE — Assessment & Plan Note (Signed)
Hemorrhoid issue as outlined.  Declines for me to evaluate.  Had recent colonoscopy.  Obtain results.  anusol HC suppositories.  analpram if needed.  Follow.

## 2021-03-25 NOTE — Assessment & Plan Note (Signed)
Saw GI (Dr Alice Reichert).  Had recent colonoscopy.  Obtain results.

## 2021-03-25 NOTE — Assessment & Plan Note (Signed)
Low carb diet and exercise.  Follow met b and a1c.   

## 2021-03-25 NOTE — Assessment & Plan Note (Signed)
Stable.  Follow.   

## 2021-03-25 NOTE — Assessment & Plan Note (Signed)
Minimal elevation.  Has seen hematology.  Recommended continued f/u.  Follow cbc to confirm stable.  

## 2021-03-25 NOTE — Assessment & Plan Note (Signed)
Continue lisinopril.  Blood pressure as outlined.  No changes pressures.  Follow metabolic panel.

## 2021-03-25 NOTE — Assessment & Plan Note (Signed)
Continue simvastatin.  Low cholesterol diet and exercise.  Follow lipid panel and liver function tests.   

## 2021-03-29 NOTE — Telephone Encounter (Signed)
Suppositories are helping some. Gave her info about alternative to ozempic and advised I will call her pharmacy about what cream is preferred.

## 2021-03-29 NOTE — Telephone Encounter (Signed)
Regarding coverage for the cream, please contact pharmacy and see what creams are alternatives to what I prescribed.  Please let Caitlin Bennett know we are checking on what is covered and apologize to her for delay in responding.  Also, did the suppositories help?  Regarding alternative to ozempic - trulicity, mounjaro.

## 2021-03-30 NOTE — Telephone Encounter (Signed)
Spoke with [facility]. There is not a preferred cream to send in. They are all about equal pricing. Is there something else she can try or should she just use the suppositories?

## 2021-03-30 NOTE — Telephone Encounter (Signed)
Reviewed message.  Please let her know that the creams are apparently all (relatively) the same price.  If the suppositories helped, if she is agreeable, we can try another prescription for the suppositories and see if resolves.  If persistent problems, can refer to surgery for further evaluation.

## 2021-04-03 NOTE — Telephone Encounter (Signed)
LMTCB

## 2021-04-03 NOTE — Telephone Encounter (Signed)
Patient stated that she used her last suppository this morning. Is some better. Will let me know if she feels like she needs a refill on them. Patient does not want referral to surgery right now.

## 2021-04-20 DIAGNOSIS — M47816 Spondylosis without myelopathy or radiculopathy, lumbar region: Secondary | ICD-10-CM | POA: Diagnosis not present

## 2021-04-20 DIAGNOSIS — M5136 Other intervertebral disc degeneration, lumbar region: Secondary | ICD-10-CM | POA: Diagnosis not present

## 2021-05-23 ENCOUNTER — Encounter: Payer: Self-pay | Admitting: Internal Medicine

## 2021-05-23 MED ORDER — MELOXICAM 15 MG PO TABS: ORAL_TABLET | ORAL | 0 refills | Status: DC

## 2021-05-23 MED ORDER — PANTOPRAZOLE SODIUM 40 MG PO TBEC: 40.0000 mg | DELAYED_RELEASE_TABLET | Freq: Every day | ORAL | 2 refills | Status: DC

## 2021-05-23 NOTE — Addendum Note (Signed)
Addended by: Elpidio Galea T on: 05/23/2021 10:50 AM   Modules accepted: Orders

## 2021-06-16 DIAGNOSIS — H43813 Vitreous degeneration, bilateral: Secondary | ICD-10-CM | POA: Diagnosis not present

## 2021-06-21 IMAGING — DX DG KNEE 1-2V PORT*R*
2 series · 2 of 2 positions shown · non-contrast
Comparison: None.

CLINICAL DATA: Status post total knee replacement

EXAM:
PORTABLE RIGHT KNEE - 1-2 VIEW

[knee ap]
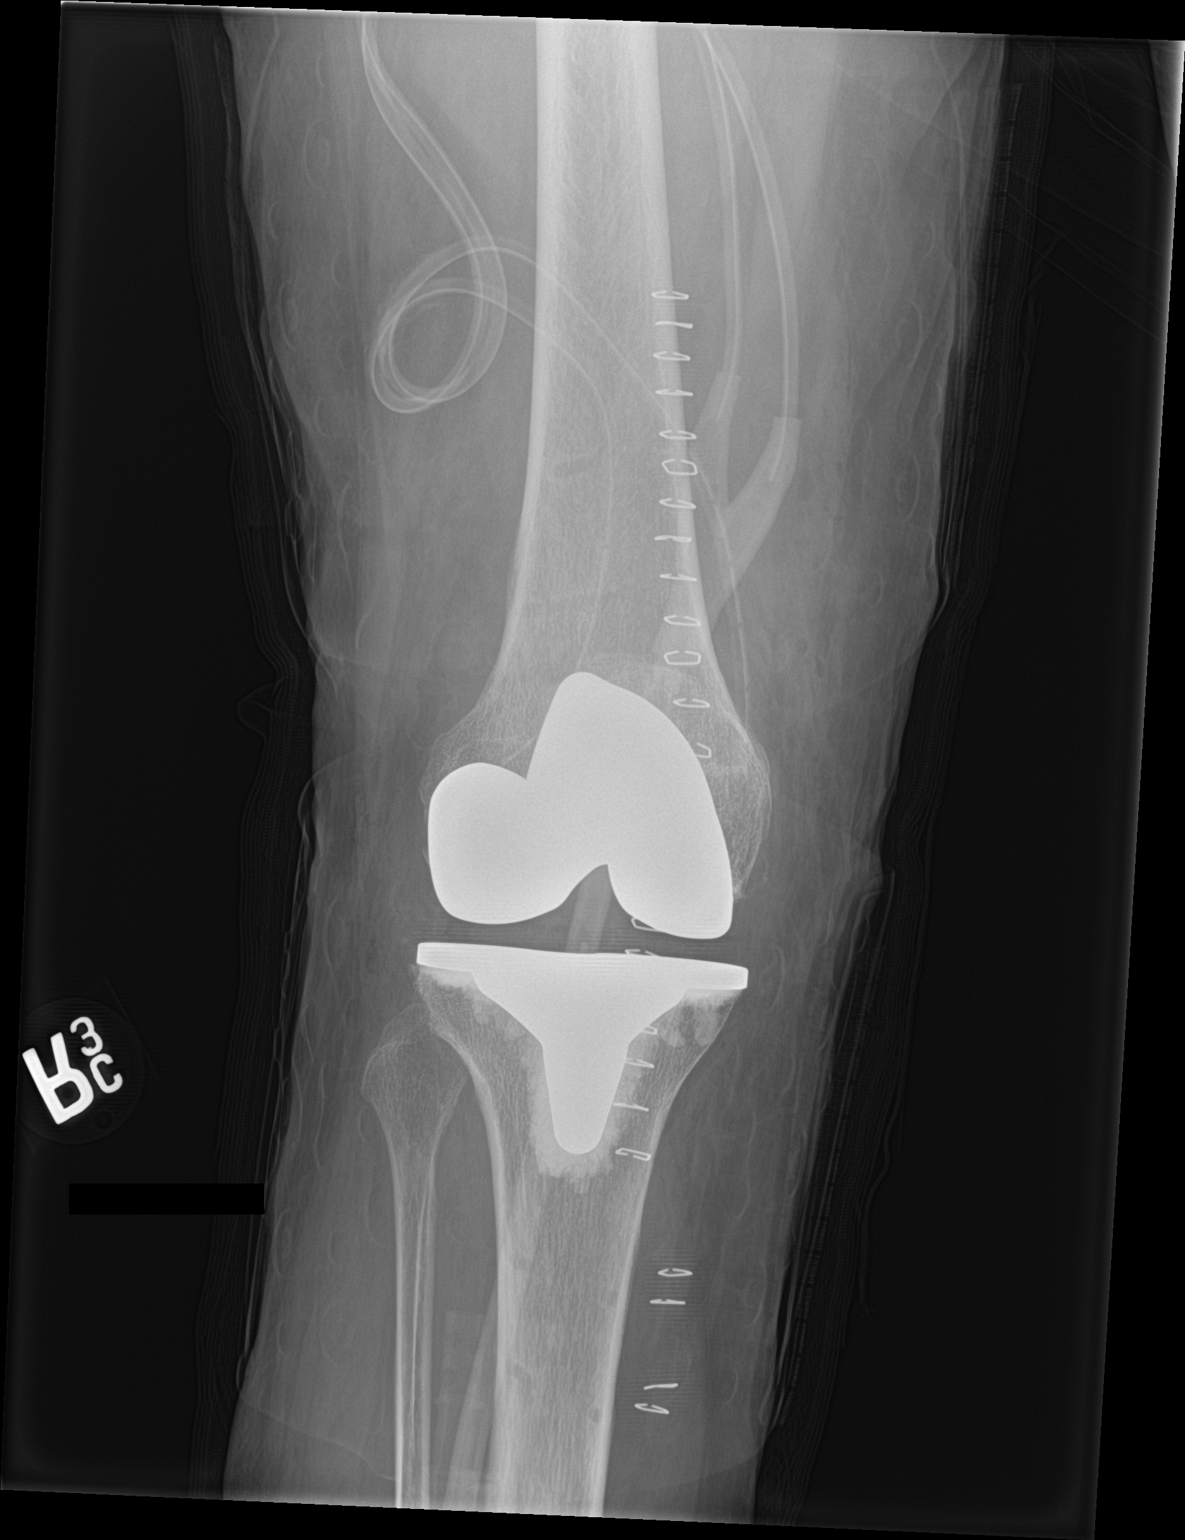

[knee lat]
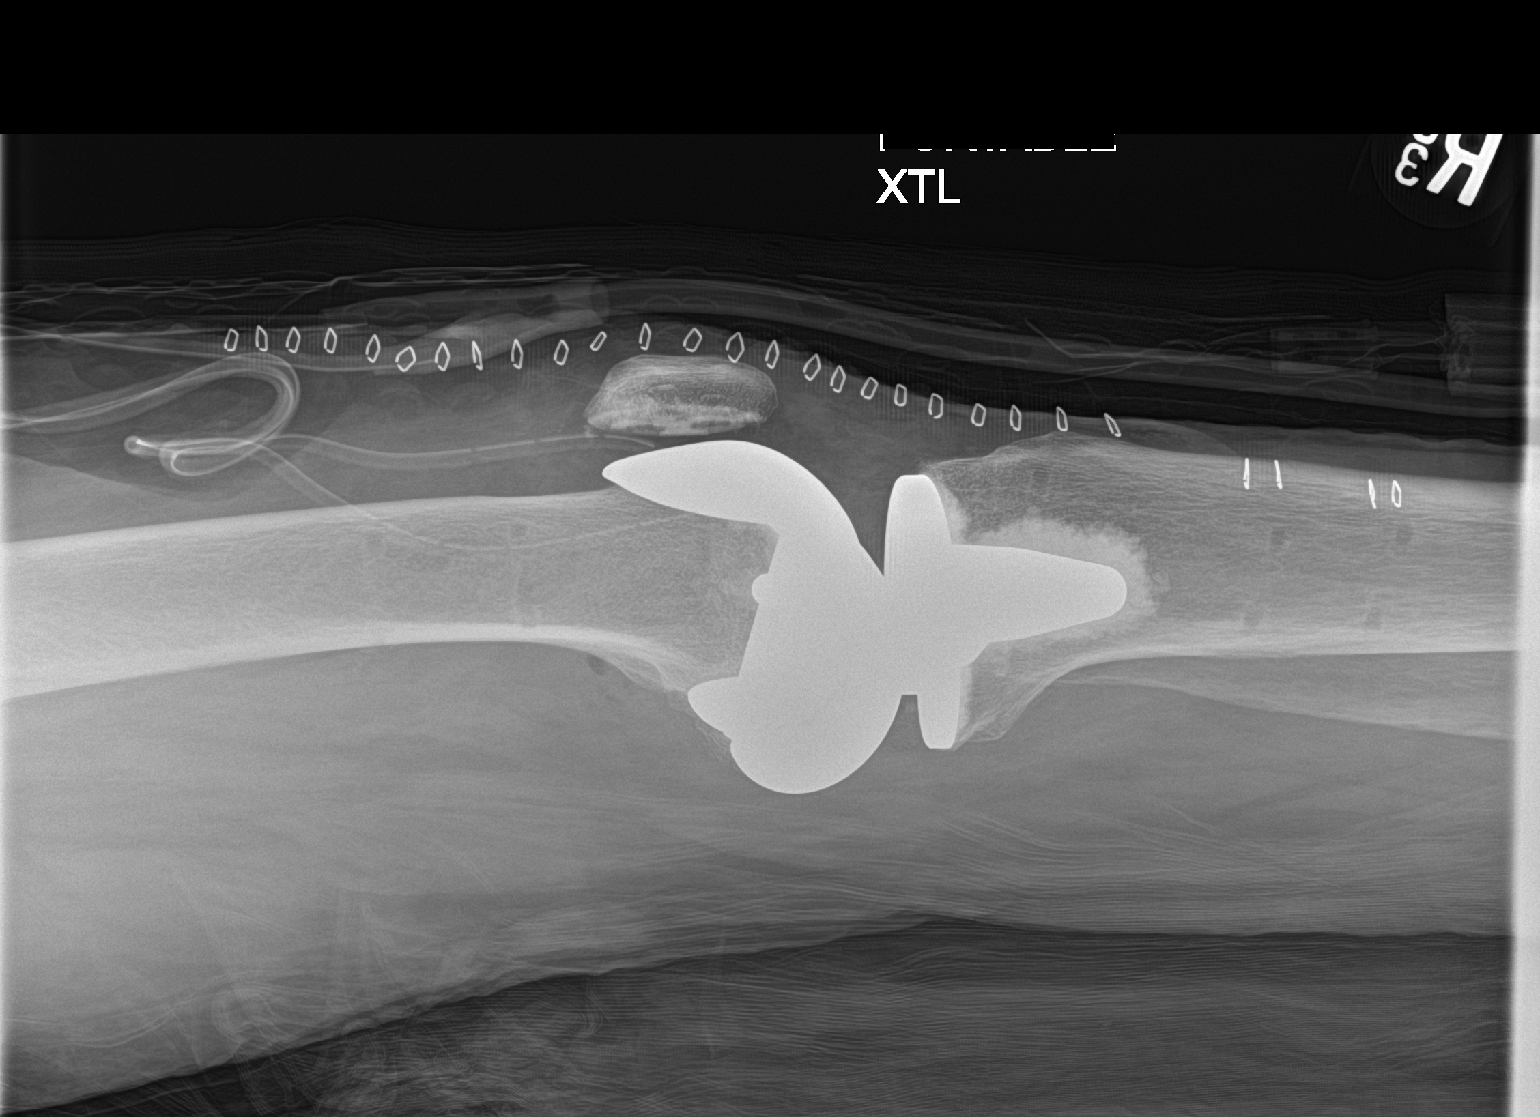

[2 of 2 positions shown; findings below may reference images not displayed]

FINDINGS: Status post right knee total arthroplasty with expected overlying
postoperative changes. No evidence of component malpositioning or
perihardware fracture. Osteotomy sites of the distal femur and
proximal tibia in keeping with prior external fixation.
IMPRESSION: Status post right knee total arthroplasty with expected overlying
postoperative changes. No evidence of component malpositioning or
perihardware fracture.

## 2021-06-27 DIAGNOSIS — H2511 Age-related nuclear cataract, right eye: Secondary | ICD-10-CM | POA: Diagnosis not present

## 2021-07-01 DIAGNOSIS — U071 COVID-19: Secondary | ICD-10-CM

## 2021-07-01 HISTORY — DX: COVID-19: U07.1

## 2021-07-03 ENCOUNTER — Encounter: Payer: Self-pay | Admitting: Ophthalmology

## 2021-07-07 NOTE — Discharge Instructions (Signed)

## 2021-07-12 ENCOUNTER — Encounter
Admission: RE | Disposition: A | Discharge status: Home / Self Care | Payer: Self-pay | Source: Home / Self Care | Attending: Ophthalmology

## 2021-07-12 ENCOUNTER — Encounter: Payer: Self-pay | Admitting: Ophthalmology

## 2021-07-12 ENCOUNTER — Ambulatory Visit: Payer: Medicare HMO | Admitting: Anesthesiology

## 2021-07-12 ENCOUNTER — Ambulatory Visit
Admission: RE | Admit: 2021-07-12 | Discharge: 2021-07-12 | Disposition: A | Discharge status: Home / Self Care | Payer: Medicare HMO | Attending: Ophthalmology | Admitting: Ophthalmology

## 2021-07-12 ENCOUNTER — Other Ambulatory Visit: Payer: Self-pay

## 2021-07-12 DIAGNOSIS — H2511 Age-related nuclear cataract, right eye: Secondary | ICD-10-CM | POA: Insufficient documentation

## 2021-07-12 DIAGNOSIS — I10 Essential (primary) hypertension: Secondary | ICD-10-CM | POA: Insufficient documentation

## 2021-07-12 DIAGNOSIS — K219 Gastro-esophageal reflux disease without esophagitis: Secondary | ICD-10-CM | POA: Insufficient documentation

## 2021-07-12 DIAGNOSIS — Z79899 Other long term (current) drug therapy: Secondary | ICD-10-CM | POA: Insufficient documentation

## 2021-07-12 DIAGNOSIS — Z8616 Personal history of COVID-19: Secondary | ICD-10-CM | POA: Insufficient documentation

## 2021-07-12 DIAGNOSIS — H25811 Combined forms of age-related cataract, right eye: Secondary | ICD-10-CM | POA: Diagnosis not present

## 2021-07-12 HISTORY — PX: CATARACT EXTRACTION W/PHACO: SHX586

## 2021-07-12 SURGERY — PHACOEMULSIFICATION, CATARACT, WITH IOL INSERTION
Anesthesia: Monitor Anesthesia Care | Site: Eye | Laterality: Right

## 2021-07-12 MED ORDER — MIDAZOLAM HCL 2 MG/2ML IJ SOLN
INTRAMUSCULAR | Status: DC | PRN
  Administered 2021-07-12: 1.5 mg via INTRAVENOUS

## 2021-07-12 MED ORDER — SIGHTPATH DOSE#1 NA HYALUR & NA CHOND-NA HYALUR IO KIT
PACK | INTRAOCULAR | Status: DC | PRN
  Administered 2021-07-12: 1 via OPHTHALMIC

## 2021-07-12 MED ORDER — TETRACAINE HCL 0.5 % OP SOLN
1.0000 [drp] | OPHTHALMIC | Status: DC | PRN
  Administered 2021-07-12 (×3): 1 [drp] via OPHTHALMIC

## 2021-07-12 MED ORDER — SIGHTPATH DOSE#1 BSS IO SOLN
INTRAOCULAR | Status: DC | PRN
  Administered 2021-07-12: 09:00:00 1 mL

## 2021-07-12 MED ORDER — SIGHTPATH DOSE#1 BSS IO SOLN
INTRAOCULAR | Status: DC | PRN
  Administered 2021-07-12: 09:00:00 67 mL via OPHTHALMIC

## 2021-07-12 MED ORDER — BRIMONIDINE TARTRATE-TIMOLOL 0.2-0.5 % OP SOLN
OPHTHALMIC | Status: DC | PRN
  Administered 2021-07-12: 1 [drp] via OPHTHALMIC

## 2021-07-12 MED ORDER — CEFUROXIME OPHTHALMIC INJECTION 1 MG/0.1 ML
INJECTION | OPHTHALMIC | Status: DC | PRN
  Administered 2021-07-12: 0.1 mL via INTRACAMERAL

## 2021-07-12 MED ORDER — ARMC OPHTHALMIC DILATING DROPS
1.0000 "application " | OPHTHALMIC | Status: DC | PRN
  Administered 2021-07-12 (×3): 1 via OPHTHALMIC

## 2021-07-12 MED ORDER — SIGHTPATH DOSE#1 BSS IO SOLN
INTRAOCULAR | Status: DC | PRN
  Administered 2021-07-12: 15 mL

## 2021-07-12 MED ORDER — FENTANYL CITRATE (PF) 100 MCG/2ML IJ SOLN
INTRAMUSCULAR | Status: DC | PRN
  Administered 2021-07-12: 50 ug via INTRAVENOUS

## 2021-07-12 MED ORDER — LACTATED RINGERS IV SOLN: INTRAVENOUS | Status: DC

## 2021-07-12 SURGICAL SUPPLY — 19 items
CANNULA ANT/CHMB 27G (MISCELLANEOUS) IMPLANT
CANNULA ANT/CHMB 27GA (MISCELLANEOUS) IMPLANT
GLOVE SRG 8 PF TXTR STRL LF DI (GLOVE) ×1 IMPLANT
GLOVE SURG ENC TEXT LTX SZ7.5 (GLOVE) ×2 IMPLANT
GLOVE SURG GAMMEX PI TX LF 7.5 (GLOVE) IMPLANT
GLOVE SURG UNDER POLY LF SZ8 (GLOVE) ×2
LENS IOL EYHANCE TORIC II 4.5 ×1 IMPLANT
NDL FILTER BLUNT 18X1 1/2 (NEEDLE) ×1 IMPLANT
NDL RETROBULBAR .5 NSTRL (NEEDLE) IMPLANT
NEEDLE FILTER BLUNT 18X 1/2SAF (NEEDLE) ×1
NEEDLE FILTER BLUNT 18X1 1/2 (NEEDLE) ×1 IMPLANT
PACK VIT ANT 23G (MISCELLANEOUS) IMPLANT
RING MALYGIN 7.0 (MISCELLANEOUS) IMPLANT
SUT ETHILON 10-0 CS-B-6CS-B-6 (SUTURE)
SUT VICRYL  9 0 (SUTURE)
SUT VICRYL 9 0 (SUTURE) IMPLANT
SUTURE EHLN 10-0 CS-B-6CS-B-6 (SUTURE) IMPLANT
SYR 3ML LL SCALE MARK (SYRINGE) ×2 IMPLANT
WATER STERILE IRR 250ML POUR (IV SOLUTION) ×2 IMPLANT

## 2021-07-12 NOTE — Transfer of Care (Signed)
Immediate Anesthesia Transfer of Care Note  Patient: Caitlin Bennett  Procedure(s) Performed: CATARACT EXTRACTION PHACO AND INTRAOCULAR LENS PLACEMENT (IOC) RIGHT TORIC LENS (Right: Eye)  Patient Location: PACU  Anesthesia Type: MAC  Level of Consciousness: awake, alert  and patient cooperative  Airway and Oxygen Therapy: Patient Spontanous Breathing and Patient connected to supplemental oxygen  Post-op Assessment: Post-op Vital signs reviewed, Patient's Cardiovascular Status Stable, Respiratory Function Stable, Patent Airway and No signs of Nausea or vomiting  Post-op Vital Signs: Reviewed and stable  Complications: No notable events documented.

## 2021-07-12 NOTE — Anesthesia Preprocedure Evaluation (Signed)
Anesthesia Evaluation  Patient identified by MRN, date of birth, ID band  History of Anesthesia Complications (+) history of anesthetic complications  Airway Mallampati: II  TM Distance: >3 FB Neck ROM: Full    Dental no notable dental hx.    Pulmonary neg pulmonary ROS,    Pulmonary exam normal        Cardiovascular Exercise Tolerance: Good hypertension, Pt. on medications Normal cardiovascular exam     Neuro/Psych negative neurological ROS     GI/Hepatic Neg liver ROS, GERD  Medicated and Controlled,  Endo/Other  negative endocrine ROS  Renal/GU negative Renal ROS     Musculoskeletal   Abdominal   Peds  Hematology negative hematology ROS (+)   Anesthesia Other Findings   Reproductive/Obstetrics                            Anesthesia Physical Anesthesia Plan  ASA: 2  Anesthesia Plan: MAC   Post-op Pain Management: Minimal or no pain anticipated   Induction: Intravenous  PONV Risk Score and Plan: 2 and TIVA, Midazolam and Treatment may vary due to age or medical condition  Airway Management Planned: Nasal Cannula and Natural Airway  Additional Equipment: None  Intra-op Plan:   Post-operative Plan:   Informed Consent: I have reviewed the patients History and Physical, chart, labs and discussed the procedure including the risks, benefits and alternatives for the proposed anesthesia with the patient or authorized representative who has indicated his/her understanding and acceptance.       Plan Discussed with: CRNA  Anesthesia Plan Comments:         Anesthesia Quick Evaluation

## 2021-07-12 NOTE — Anesthesia Postprocedure Evaluation (Signed)
Anesthesia Post Note  Patient: Caitlin Bennett  Procedure(s) Performed: CATARACT EXTRACTION PHACO AND INTRAOCULAR LENS PLACEMENT (IOC) RIGHT TORIC LENS (Right: Eye)     Patient location during evaluation: PACU Anesthesia Type: MAC Level of consciousness: awake and alert Pain management: pain level controlled Vital Signs Assessment: post-procedure vital signs reviewed and stable Respiratory status: spontaneous breathing Cardiovascular status: blood pressure returned to baseline Postop Assessment: no apparent nausea or vomiting, adequate PO intake and no headache Anesthetic complications: no   No notable events documented.  Adele Barthel Hazael Olveda

## 2021-07-12 NOTE — Op Note (Signed)
°  LOCATION:  [place]   PREOPERATIVE DIAGNOSIS:  Nuclear sclerotic cataract of the right eye.  H25.11   POSTOPERATIVE DIAGNOSIS:  Nuclear sclerotic cataract of the right eye.   PROCEDURE:  Phacoemulsification with Toric posterior chamber intraocular lens placement of the right eye.  Ultrasound time: Procedure(s) with comments: CATARACT EXTRACTION PHACO AND INTRAOCULAR LENS PLACEMENT (IOC) RIGHT TORIC LENS (Right) - 7.70 1:08.7  LENS:   Implant Name Type Inv. Item Serial No. Manufacturer Lot No. LRB No. Used Action  [employer] TORIC IOL Intraocular Lens  [id number] [employer]  Right 1 Implanted     [id number] 7.0 D Toric intraocular lens with 4.5 diopters of cylindrical power with axis orientation at 86 degrees.   SURGEON:  Wyonia Hough, MD   ANESTHESIA: Topical with tetracaine drops and 2% Xylocaine jelly, augmented with 1% preservative-free intracameral lidocaine. .   COMPLICATIONS:  None.   DESCRIPTION OF PROCEDURE:  The patient was identified in the holding room and transported to the operating suite and placed in the supine position under the operating microscope.  The right eye was identified as the operative eye, and it was prepped and draped in the usual sterile ophthalmic fashion.    A clear-corneal paracentesis incision was made at the 12:00 position.  0.5 ml of preservative-free 1% lidocaine was injected into the anterior chamber. The anterior chamber was filled with Viscoat.  A 2.4 millimeter near clear corneal incision was then made at the 9:00 position.  A cystotome and capsulorrhexis forceps were then used to make a curvilinear capsulorrhexis.  Hydrodissection and hydrodelineation were then performed using balanced salt solution.   Phacoemulsification was then used in stop and chop fashion to remove the lens, nucleus and epinucleus.  The remaining cortex was aspirated using the irrigation and aspiration handpiece.  Provisc viscoelastic was  then placed into the capsular bag to distend it for lens placement.  The Verion digital marker was used to align the implant at the intended axis.   A Toric lens was then injected into the capsular bag.  It was rotated clockwise until the axis marks on the lens were approximately 15 degrees in the counterclockwise direction to the intended alignment.  The viscoelastic was aspirated from the eye using the irrigation aspiration handpiece.  Then, a Koch spatula through the sideport incision was used to rotate the lens in a clockwise direction until the axis markings of the intraocular lens were lined up with the Verion alignment.  Balanced salt solution was then used to hydrate the wounds. Cefuroxime 0.1 ml of a 10mg /ml solution was injected into the anterior chamber for a dose of 1 mg of intracameral antibiotic at the completion of the case.    The eye was noted to have a physiologic pressure and there was no wound leak noted.   Timolol and Brimonidine drops were applied to the eye.  The patient was taken to the recovery room in stable condition having had no complications of anesthesia or surgery.  Caitlin Bennett 07/12/2021, 9:21 AM

## 2021-07-12 NOTE — Anesthesia Procedure Notes (Signed)
Procedure Name: MAC Date/Time: 07/12/2021 9:02 AM Performed by: Mayme Genta, CRNA Pre-anesthesia Checklist: Patient identified, Emergency Drugs available, Suction available, Timeout performed and Patient being monitored Patient Re-evaluated:Patient Re-evaluated prior to induction Oxygen Delivery Method: Nasal cannula Placement Confirmation: positive ETCO2

## 2021-07-12 NOTE — H&P (Signed)
[facility]   Primary Care Physician:  Einar Pheasant, MD Ophthalmologist: Dr. Leandrew Koyanagi  Pre-Procedure History & Physical: HPI:  Caitlin Bennett is a 75 y.o. female here for ophthalmic surgery.   Past Medical History:  Diagnosis Date   Blood in the stool    10 yrs ago   COVID-19 07/01/2021   Runny nose 12/15. (+) test 12/17.  Symptom resolution 07/02/21.   Diverticulitis    GERD (gastroesophageal reflux disease)    History of abnormal Pap smear    History of chicken pox    History of colon polyps    History of kidney stones    h/o    Hypercholesterolemia    Hypertension    Lumbosacral spondylolysis    Seronegative rheumatoid arthritis ([place])     Past Surgical History:  Procedure Laterality Date   COLONOSCOPY     DILATION AND CURETTAGE OF UTERUS     KNEE ARTHROPLASTY Right 05/13/2019   Procedure: COMPUTER ASSISTED TOTAL KNEE ARTHROPLASTY RIGHT;  Surgeon: Dereck Leep, MD;  Location: [facility] ORS;  Service: Orthopedics;  Laterality: Right;   LEG / ANKLE SOFT TISSUE BIOPSY  2011   to confirm arthritis    Prior to Admission medications   Medication Sig Start Date End Date Taking? Authorizing Provider  aspirin EC 81 MG tablet Take 81 mg by mouth daily. Take 2 tablets daily.   Yes [provider]  calcium carbonate (TUMS EX) 750 MG chewable tablet Chew 1 tablet by mouth daily.   Yes [provider]  diphenhydramine-acetaminophen (TYLENOL PM) 25-500 MG TABS tablet Take 1 tablet by mouth at bedtime.   Yes [provider]  lisinopril (ZESTRIL) 10 MG tablet Take 1 tablet (10 mg total) by mouth daily. 11/27/20  Yes Einar Pheasant, MD  meloxicam [facility]) 15 MG tablet Take one tablet qod 05/23/21  Yes Einar Pheasant, MD  Multiple Vitamin (MULTIVITAMIN WITH MINERALS) TABS tablet Take 1 tablet by mouth daily.   Yes [provider]  pantoprazole (PROTONIX) 40 MG tablet Take 1 tablet (40 mg total) by mouth daily. 05/23/21  Yes Einar Pheasant, MD  Probiotic Product (PROBIOTIC PO) Take 1 capsule by mouth daily.   Yes [provider]  simvastatin (ZOCOR) 20 MG tablet Take 1 tablet (20 mg total) by mouth every evening. 11/27/20  Yes Einar Pheasant, MD  traMADol [facility]) 50 MG tablet 1-2 po bid prn 02/24/20  Yes [provider]  hydrocortisone (ANUSOL-HC) 25 MG suppository Place 1 suppository (25 mg total) rectally 2 (two) times daily. Patient not taking: Reported on 07/03/2021 03/24/21   Einar Pheasant, MD  hydrocortisone-pramoxine [facility]) 2.5-1 % rectal cream Place 1 application rectally 2 (two) times daily as needed for hemorrhoids or anal itching. Patient not taking: Reported on 07/03/2021 03/24/21   Einar Pheasant, MD  Omega-3 Fatty Acids (FISH OIL) 1200 MG CAPS Take 1,200 mg by mouth daily. Patient not taking: Reported on 07/03/2021    [provider]    Allergies as of 06/20/2021 - Review Complete 03/25/2021  Allergen Reaction Noted   Sumycin [tetracycline hcl] Rash 10/31/2010    Family History  Problem Relation Age of Onset   Arthritis Father    Hyperlipidemia Father    Heart disease Father    Hypertension Father    Diabetes Father    Pulmonary fibrosis Father    Arthritis Mother    Heart disease Mother    Hyperlipidemia Mother    Hypertension Mother  Colon cancer Maternal Aunt    Colon cancer Maternal Uncle    Breast cancer Neg Hx     Social History   Socioeconomic History   Marital status: Divorced    Spouse name: Not on file   Number of children: Not on file   Years of education: Not on file   Highest education level: Not on file  Occupational History   Not on file  Tobacco Use   Smoking status: Never   Smokeless tobacco: Never  Vaping Use   Vaping Use: Never used  Substance and Sexual Activity   Alcohol use: Yes    Alcohol/week: 0.0 standard drinks    Comment: rare   Drug use: No   Sexual activity: Not on file  Other Topics Concern   Not on file   Social History Narrative   Not on file   Social Determinants of Health   Financial Resource Strain: Low Risk    Difficulty of Paying Living Expenses: Not hard at all  Food Insecurity: No Food Insecurity   Worried About Charity fundraiser in the Last Year: Never true   [place] in the Last Year: Never true  Transportation Needs: No Transportation Needs   Lack of Transportation (Medical): No   Lack of Transportation (Non-Medical): No  Physical Activity: Sufficiently Active   Days of Exercise per Week: 5 days   Minutes of Exercise per Session: 30 min  Stress: No Stress Concern Present   Feeling of Stress : Not at all  Social Connections: Unknown   Frequency of Communication with Friends and Family: More than three times a week   Frequency of Social Gatherings with Friends and Family: More than three times a week   Attends Religious Services: Not on Electrical engineer or Organizations: Not on file   Attends Archivist Meetings: Not on file   Marital Status: Not on file  Intimate Partner Violence: Not At Risk   Fear of Current or Ex-Partner: No   Emotionally Abused: No   Physically Abused: No   Sexually Abused: No    Review of Systems: See HPI, otherwise negative ROS  Physical Exam: BP (!) 153/95    Pulse (!) 114    Temp (!) 97.5 F (36.4 C) (Temporal)    Ht 5\' 1"  (1.549 m)    Wt 78.3 kg    LMP 07/16/1996    SpO2 94%    BMI 32.63 kg/m  General:   Alert,  pleasant and cooperative in NAD Head:  Normocephalic and atraumatic. Lungs:  Clear to auscultation.    Heart:  Regular rate and rhythm.   Impression/Plan: Caitlin Bennett is here for ophthalmic surgery.  Risks, benefits, limitations, and alternatives regarding ophthalmic surgery have been reviewed with the patient.  Questions have been answered.  All parties agreeable.   Leandrew Koyanagi, MD  07/12/2021, 8:55 AM

## 2021-07-13 ENCOUNTER — Encounter: Payer: Self-pay | Admitting: Ophthalmology

## 2021-07-18 ENCOUNTER — Encounter: Payer: Self-pay | Admitting: Ophthalmology

## 2021-07-24 NOTE — Discharge Instructions (Signed)

## 2021-07-26 ENCOUNTER — Other Ambulatory Visit: Payer: Self-pay

## 2021-07-26 ENCOUNTER — Ambulatory Visit: Payer: Medicare HMO | Admitting: Anesthesiology

## 2021-07-26 ENCOUNTER — Ambulatory Visit
Admission: RE | Admit: 2021-07-26 | Discharge: 2021-07-26 | Disposition: A | Discharge status: Home / Self Care | Payer: Medicare HMO | Attending: Ophthalmology | Admitting: Ophthalmology

## 2021-07-26 ENCOUNTER — Encounter: Payer: Self-pay | Admitting: Ophthalmology

## 2021-07-26 ENCOUNTER — Encounter
Admission: RE | Disposition: A | Discharge status: Home / Self Care | Payer: Self-pay | Source: Home / Self Care | Attending: Ophthalmology

## 2021-07-26 DIAGNOSIS — H2512 Age-related nuclear cataract, left eye: Secondary | ICD-10-CM | POA: Insufficient documentation

## 2021-07-26 DIAGNOSIS — I10 Essential (primary) hypertension: Secondary | ICD-10-CM | POA: Insufficient documentation

## 2021-07-26 DIAGNOSIS — H25812 Combined forms of age-related cataract, left eye: Secondary | ICD-10-CM | POA: Diagnosis not present

## 2021-07-26 HISTORY — PX: CATARACT EXTRACTION W/PHACO: SHX586

## 2021-07-26 SURGERY — PHACOEMULSIFICATION, CATARACT, WITH IOL INSERTION
Anesthesia: Monitor Anesthesia Care | Site: Eye | Laterality: Left

## 2021-07-26 MED ORDER — SIGHTPATH DOSE#1 BSS IO SOLN
INTRAOCULAR | Status: DC | PRN
  Administered 2021-07-26: 15 mL

## 2021-07-26 MED ORDER — TETRACAINE HCL 0.5 % OP SOLN
1.0000 [drp] | OPHTHALMIC | Status: DC | PRN
  Administered 2021-07-26 (×3): 1 [drp] via OPHTHALMIC

## 2021-07-26 MED ORDER — SIGHTPATH DOSE#1 NA HYALUR & NA CHOND-NA HYALUR IO KIT
PACK | INTRAOCULAR | Status: DC | PRN
  Administered 2021-07-26: 1 via OPHTHALMIC

## 2021-07-26 MED ORDER — LACTATED RINGERS IV SOLN: INTRAVENOUS | Status: DC

## 2021-07-26 MED ORDER — ARMC OPHTHALMIC DILATING DROPS
1.0000 "application " | OPHTHALMIC | Status: DC | PRN
  Administered 2021-07-26 (×3): 1 via OPHTHALMIC

## 2021-07-26 MED ORDER — MIDAZOLAM HCL 2 MG/2ML IJ SOLN
INTRAMUSCULAR | Status: DC | PRN
  Administered 2021-07-26: 2 mg via INTRAVENOUS
  Administered 2021-07-26: 1 mg via INTRAVENOUS

## 2021-07-26 MED ORDER — SIGHTPATH DOSE#1 BSS IO SOLN
INTRAOCULAR | Status: DC | PRN
  Administered 2021-07-26: 1 mL via INTRAMUSCULAR

## 2021-07-26 MED ORDER — BRIMONIDINE TARTRATE-TIMOLOL 0.2-0.5 % OP SOLN
OPHTHALMIC | Status: DC | PRN
  Administered 2021-07-26: 1 [drp] via OPHTHALMIC

## 2021-07-26 MED ORDER — FENTANYL CITRATE (PF) 100 MCG/2ML IJ SOLN
INTRAMUSCULAR | Status: DC | PRN
  Administered 2021-07-26: 100 ug via INTRAVENOUS

## 2021-07-26 MED ORDER — OXYCODONE HCL 5 MG/5ML PO SOLN: 5.0000 mg | Freq: Once | ORAL | Status: DC | PRN

## 2021-07-26 MED ORDER — CEFUROXIME OPHTHALMIC INJECTION 1 MG/0.1 ML
INJECTION | OPHTHALMIC | Status: DC | PRN
  Administered 2021-07-26: 0.1 mL via INTRACAMERAL

## 2021-07-26 MED ORDER — SIGHTPATH DOSE#1 BSS IO SOLN
INTRAOCULAR | Status: DC | PRN
  Administered 2021-07-26: 71 mL via OPHTHALMIC

## 2021-07-26 MED ORDER — OXYCODONE HCL 5 MG PO TABS: 5.0000 mg | ORAL_TABLET | Freq: Once | ORAL | Status: DC | PRN

## 2021-07-26 SURGICAL SUPPLY — 12 items
CATARACT SUITE SIGHTPATH (MISCELLANEOUS) ×2 IMPLANT
FEE CATARACT SUITE SIGHTPATH (MISCELLANEOUS) ×1 IMPLANT
GLOVE SRG 8 PF TXTR STRL LF DI (GLOVE) ×1 IMPLANT
GLOVE SURG ENC TEXT LTX SZ7.5 (GLOVE) ×2 IMPLANT
GLOVE SURG UNDER POLY LF SZ8 (GLOVE) ×2
LENS IOL EYHANCE TORIC II 10.0 ×2 IMPLANT
LENS IOL EYHANCE TRC 375 10.0 IMPLANT
NDL FILTER BLUNT 18X1 1/2 (NEEDLE) ×1 IMPLANT
NEEDLE FILTER BLUNT 18X 1/2SAF (NEEDLE) ×1
NEEDLE FILTER BLUNT 18X1 1/2 (NEEDLE) ×1 IMPLANT
SYR 3ML LL SCALE MARK (SYRINGE) ×2 IMPLANT
WATER STERILE IRR 250ML POUR (IV SOLUTION) ×2 IMPLANT

## 2021-07-26 NOTE — H&P (Signed)
[facility]   Primary Care Physician:  Einar Pheasant, MD Ophthalmologist: Dr. Leandrew Koyanagi  Pre-Procedure History & Physical: HPI:  Caitlin Bennett is a 76 y.o. female here for ophthalmic surgery.   Past Medical History:  Diagnosis Date   Blood in the stool    10 yrs ago   COVID-19 07/01/2021   Runny nose 12/15. (+) test 12/17.  Symptom resolution 07/02/21.   Diverticulitis    GERD (gastroesophageal reflux disease)    History of abnormal Pap smear    History of chicken pox    History of colon polyps    History of kidney stones    h/o    Hypercholesterolemia    Hypertension    Lumbosacral spondylolysis    Seronegative rheumatoid arthritis [facility])     Past Surgical History:  Procedure Laterality Date   CATARACT EXTRACTION W/PHACO Right 07/12/2021   Procedure: CATARACT EXTRACTION PHACO AND INTRAOCULAR LENS PLACEMENT ([place]) RIGHT TORIC LENS;  Surgeon: Leandrew Koyanagi, MD;  Location: [place];  Service: Ophthalmology;  Laterality: Right;  7.70 1:08.7   COLONOSCOPY     DILATION AND CURETTAGE OF UTERUS     KNEE ARTHROPLASTY Right 05/13/2019   Procedure: COMPUTER ASSISTED TOTAL KNEE ARTHROPLASTY RIGHT;  Surgeon: Dereck Leep, MD;  Location: [facility];  Service: Orthopedics;  Laterality: Right;   LEG / ANKLE SOFT TISSUE BIOPSY  2011   to confirm arthritis    Prior to Admission medications   Medication Sig Start Date End Date Taking? Authorizing Provider  aspirin EC 81 MG tablet Take 81 mg by mouth daily. Take 2 tablets daily.   Yes [provider]  calcium carbonate (TUMS EX) 750 MG chewable tablet Chew 1 tablet by mouth daily.   Yes [provider]  diphenhydramine-acetaminophen (TYLENOL PM) 25-500 MG TABS tablet Take 1 tablet by mouth at bedtime.   Yes [provider]  lisinopril (ZESTRIL) 10 MG tablet Take 1 tablet (10 mg total) by mouth daily. 11/27/20  Yes Einar Pheasant, MD  Multiple Vitamin (MULTIVITAMIN WITH  MINERALS) TABS tablet Take 1 tablet by mouth daily.   Yes [provider]  pantoprazole (PROTONIX) 40 MG tablet Take 1 tablet (40 mg total) by mouth daily. 05/23/21  Yes Einar Pheasant, MD  Probiotic Product (PROBIOTIC PO) Take 1 capsule by mouth daily.   Yes [provider]  hydrocortisone (ANUSOL-HC) 25 MG suppository Place 1 suppository (25 mg total) rectally 2 (two) times daily. Patient not taking: Reported on 07/03/2021 03/24/21   Einar Pheasant, MD  hydrocortisone-pramoxine [facility]) 2.5-1 % rectal cream Place 1 application rectally 2 (two) times daily as needed for hemorrhoids or anal itching. Patient not taking: Reported on 07/03/2021 03/24/21   Einar Pheasant, MD  meloxicam [facility]) 15 MG tablet Take one tablet qod 05/23/21   Einar Pheasant, MD  Omega-3 Fatty Acids (FISH OIL) 1200 MG CAPS Take 1,200 mg by mouth daily. Patient not taking: Reported on 07/03/2021    [provider]  simvastatin (ZOCOR) 20 MG tablet Take 1 tablet (20 mg total) by mouth every evening. 11/27/20   Einar Pheasant, MD  traMADol [facility]) 50 MG tablet 1-2 po bid prn 02/24/20   [provider]    Allergies as of 06/20/2021 - Review Complete 03/25/2021  Allergen Reaction Noted   Sumycin [tetracycline hcl] Rash 10/31/2010    Family History  Problem Relation Age of Onset   Arthritis Father    Hyperlipidemia Father    Heart disease Father  Hypertension Father    Diabetes Father    Pulmonary fibrosis Father    Arthritis Mother    Heart disease Mother    Hyperlipidemia Mother    Hypertension Mother    Colon cancer Maternal Aunt    Colon cancer Maternal Uncle    Breast cancer Neg Hx     Social History   Socioeconomic History   Marital status: Divorced    Spouse name: Not on file   Number of children: Not on file   Years of education: Not on file   Highest education level: Not on file  Occupational History   Not on file  Tobacco Use   Smoking status: Never    Smokeless tobacco: Never  Vaping Use   Vaping Use: Never used  Substance and Sexual Activity   Alcohol use: Yes    Alcohol/week: 0.0 standard drinks    Comment: rare   Drug use: No   Sexual activity: Not on file  Other Topics Concern   Not on file  Social History Narrative   Not on file   Social Determinants of Health   Financial Resource Strain: Low Risk    Difficulty of Paying Living Expenses: Not hard at all  Food Insecurity: No Food Insecurity   Worried About Charity fundraiser in the Last Year: Never true   [place] in the Last Year: Never true  Transportation Needs: No Transportation Needs   Lack of Transportation (Medical): No   Lack of Transportation (Non-Medical): No  Physical Activity: Sufficiently Active   Days of Exercise per Week: 5 days   Minutes of Exercise per Session: 30 min  Stress: No Stress Concern Present   Feeling of Stress : Not at all  Social Connections: Unknown   Frequency of Communication with Friends and Family: More than three times a week   Frequency of Social Gatherings with Friends and Family: More than three times a week   Attends Religious Services: Not on Electrical engineer or Organizations: Not on file   Attends Archivist Meetings: Not on file   Marital Status: Not on file  Intimate Partner Violence: Not At Risk   Fear of Current or Ex-Partner: No   Emotionally Abused: No   Physically Abused: No   Sexually Abused: No    Review of Systems: See HPI, otherwise negative ROS  Physical Exam: BP (!) 145/100    Pulse 94    Temp 97.8 F (36.6 C) (Temporal)    Resp 18    Ht 5\' 1"  (1.549 m)    Wt 78 kg    LMP 07/16/1996    SpO2 94%    BMI 32.50 kg/m  General:   Alert,  pleasant and cooperative in NAD Head:  Normocephalic and atraumatic. Lungs:  Clear to auscultation.    Heart:  Regular rate and rhythm.   Impression/Plan: Caitlin Bennett is here for ophthalmic surgery.  Risks, benefits, limitations, and  alternatives regarding ophthalmic surgery have been reviewed with the patient.  Questions have been answered.  All parties agreeable.   Leandrew Koyanagi, MD  07/26/2021, 8:21 AM

## 2021-07-26 NOTE — Anesthesia Preprocedure Evaluation (Signed)
Anesthesia Evaluation  Patient identified by MRN, date of birth, ID band Patient awake    Reviewed: Allergy & Precautions, H&P , NPO status , Patient's Chart, lab work & pertinent test results  Airway Mallampati: II  TM Distance: >3 FB Neck ROM: full    Dental no notable dental hx.    Pulmonary neg pulmonary ROS,    Pulmonary exam normal        Cardiovascular hypertension, On Medications Normal cardiovascular exam Rhythm:regular Rate:Normal     Neuro/Psych  Headaches, negative psych ROS   GI/Hepatic Neg liver ROS, Medicated,  Endo/Other  negative endocrine ROS  Renal/GU negative Renal ROS     Musculoskeletal   Abdominal   Peds  Hematology negative hematology ROS (+)   Anesthesia Other Findings   Reproductive/Obstetrics                             Anesthesia Physical Anesthesia Plan  ASA: 2  Anesthesia Plan: MAC   Post-op Pain Management:    Induction:   PONV Risk Score and Plan: 2 and TIVA, Midazolam and Treatment may vary due to age or medical condition  Airway Management Planned:   Additional Equipment:   Intra-op Plan:   Post-operative Plan:   Informed Consent: I have reviewed the patients History and Physical, chart, labs and discussed the procedure including the risks, benefits and alternatives for the proposed anesthesia with the patient or authorized representative who has indicated his/her understanding and acceptance.       Plan Discussed with:   Anesthesia Plan Comments:         Anesthesia Quick Evaluation

## 2021-07-26 NOTE — Anesthesia Postprocedure Evaluation (Signed)
Anesthesia Post Note  Patient: Caitlin Bennett  Procedure(s) Performed: CATARACT EXTRACTION PHACO AND INTRAOCULAR LENS PLACEMENT ([facility]) LEFT TORIC LENS 6.24 01:02.2 (Left: Eye)     Patient location during evaluation: PACU Anesthesia Type: MAC Level of consciousness: awake and alert Pain management: pain level controlled Vital Signs Assessment: post-procedure vital signs reviewed and stable Respiratory status: spontaneous breathing Cardiovascular status: stable Anesthetic complications: no   No notable events documented.  Gillian Scarce

## 2021-07-26 NOTE — Op Note (Signed)
°  LOCATION:  [place]   PREOPERATIVE DIAGNOSIS:  Nuclear sclerotic cataract of the left eye.  H25.12  POSTOPERATIVE DIAGNOSIS:  Nuclear sclerotic cataract of the left eye.   PROCEDURE:  Phacoemulsification with Toric posterior chamber intraocular lens placement of the left eye.  Ultrasound time: Procedure(s): CATARACT EXTRACTION PHACO AND INTRAOCULAR LENS PLACEMENT ([facility]) LEFT TORIC LENS 6.24 01:02.2 (Left)  LENS:   Implant Name Type Inv. Item Serial No. Manufacturer Lot No. LRB No. Used Action  tecnis eyhance toric II IOL Intraocular Lens  [id number] [employer]  Left 1 Implanted     [id number] 10.0 D Toric intraocular lens with 3.75 diopters of cylindrical power with axis orientation at 103 degrees.     SURGEON:  Wyonia Hough, MD   ANESTHESIA:  Topical with tetracaine drops and 2% Xylocaine jelly, augmented with 1% preservative-free intracameral lidocaine.  COMPLICATIONS:  None.   DESCRIPTION OF PROCEDURE:  The patient was identified in the holding room and transported to the operating suite and placed in the supine position under the operating microscope.  The left eye was identified as the operative eye, and it was prepped and draped in the usual sterile ophthalmic fashion.    A clear-corneal paracentesis incision was made at the 1:30 position.  0.5 ml of preservative-free 1% lidocaine was injected into the anterior chamber. The anterior chamber was filled with Viscoat.  A 2.4 millimeter near clear corneal incision was then made at the 10:30 position.  A cystotome and capsulorrhexis forceps were then used to make a curvilinear capsulorrhexis.  Hydrodissection and hydrodelineation were then performed using balanced salt solution.   Phacoemulsification was then used in stop and chop fashion to remove the lens, nucleus and epinucleus.  The remaining cortex was aspirated using the irrigation and aspiration handpiece.  Provisc viscoelastic was then placed into the  capsular bag to distend it for lens placement.  The Verion digital marker was used to align the implant at the intended axis.   A Toric lens was then injected into the capsular bag.  It was rotated clockwise until the axis marks on the lens were approximately 15 degrees in the counterclockwise direction to the intended alignment.  The viscoelastic was aspirated from the eye using the irrigation aspiration handpiece.  Then, a Koch spatula through the sideport incision was used to rotate the lens in a clockwise direction until the axis markings of the intraocular lens were lined up with the Verion alignment.  Balanced salt solution was then used to hydrate the wounds. Cefuroxime 0.1 ml of a 10mg /ml solution was injected into the anterior chamber for a dose of 1 mg of intracameral antibiotic at the completion of the case.    The eye was noted to have a physiologic pressure and there was no wound leak noted.   Timolol and Brimonidine drops were applied to the eye.  The patient was taken to the recovery room in stable condition having had no complications of anesthesia or surgery.  Caitlin Bennett 07/26/2021, 9:35 AM

## 2021-07-26 NOTE — Transfer of Care (Signed)
Immediate Anesthesia Transfer of Care Note  Patient: Caitlin Bennett  Procedure(s) Performed: CATARACT EXTRACTION PHACO AND INTRAOCULAR LENS PLACEMENT ([facility]) LEFT TORIC LENS 6.24 01:02.2 (Left: Eye)  Patient Location: PACU  Anesthesia Type: MAC  Level of Consciousness: awake, alert  and patient cooperative  Airway and Oxygen Therapy: Patient Spontanous Breathing and Patient connected to supplemental oxygen  Post-op Assessment: Post-op Vital signs reviewed, Patient's Cardiovascular Status Stable, Respiratory Function Stable, Patent Airway and No signs of Nausea or vomiting  Post-op Vital Signs: Reviewed and stable  Complications: No notable events documented.

## 2021-07-28 ENCOUNTER — Encounter: Payer: Self-pay | Admitting: Ophthalmology

## 2021-08-08 ENCOUNTER — Other Ambulatory Visit (INDEPENDENT_AMBULATORY_CARE_PROVIDER_SITE_OTHER): Payer: Medicare HMO

## 2021-08-08 ENCOUNTER — Other Ambulatory Visit: Payer: Self-pay

## 2021-08-08 DIAGNOSIS — R739 Hyperglycemia, unspecified: Secondary | ICD-10-CM | POA: Diagnosis not present

## 2021-08-08 DIAGNOSIS — D473 Essential (hemorrhagic) thrombocythemia: Secondary | ICD-10-CM | POA: Diagnosis not present

## 2021-08-08 DIAGNOSIS — I10 Essential (primary) hypertension: Secondary | ICD-10-CM | POA: Diagnosis not present

## 2021-08-08 DIAGNOSIS — E785 Hyperlipidemia, unspecified: Secondary | ICD-10-CM

## 2021-08-08 LAB — CBC WITH DIFFERENTIAL/PLATELET
Basophils Absolute: 0.1 10*3/uL (ref 0.0–0.1)
Basophils Relative: 1.2 % (ref 0.0–3.0)
Eosinophils Absolute: 0.2 10*3/uL (ref 0.0–0.7)
Eosinophils Relative: 3.2 % (ref 0.0–5.0)
HCT: 43.7 % (ref 36.0–46.0)
Hemoglobin: 14.3 g/dL (ref 12.0–15.0)
Lymphocytes Relative: 28.5 % (ref 12.0–46.0)
Lymphs Abs: 1.9 10*3/uL (ref 0.7–4.0)
MCHC: 32.8 g/dL (ref 30.0–36.0)
MCV: 86.4 fl (ref 78.0–100.0)
Monocytes Absolute: 0.5 10*3/uL (ref 0.1–1.0)
Monocytes Relative: 6.9 % (ref 3.0–12.0)
Neutro Abs: 4 10*3/uL (ref 1.4–7.7)
Neutrophils Relative %: 60.2 % (ref 43.0–77.0)
Platelets: 529 10*3/uL — ABNORMAL HIGH (ref 150.0–400.0)
RBC: 5.06 Mil/uL (ref 3.87–5.11)
RDW: 14.8 % (ref 11.5–15.5)
WBC: 6.6 10*3/uL (ref 4.0–10.5)

## 2021-08-08 LAB — LIPID PANEL
Cholesterol: 149 mg/dL (ref 0–200)
HDL: 40.6 mg/dL (ref >39.00)
LDL Cholesterol: 82 mg/dL (ref 0–99)
NonHDL: 108.73
Total CHOL/HDL Ratio: 4
Triglycerides: 135 mg/dL (ref 0.0–149.0)
VLDL: 27 mg/dL (ref 0.0–40.0)

## 2021-08-08 LAB — BASIC METABOLIC PANEL
BUN: 18 mg/dL (ref 6–23)
CO2: 29 mEq/L (ref 19–32)
Calcium: 9.5 mg/dL (ref 8.4–10.5)
Chloride: 104 mEq/L (ref 96–112)
Creatinine, Ser: 0.87 mg/dL (ref 0.40–1.20)
GFR: 64.49 mL/min (ref >60.00)
Glucose, Bld: 115 mg/dL — ABNORMAL HIGH (ref 70–99)
Potassium: 4.6 mEq/L (ref 3.5–5.1)
Sodium: 139 mEq/L (ref 135–145)

## 2021-08-08 LAB — HEPATIC FUNCTION PANEL
ALT: 33 U/L (ref 0–35)
AST: 27 U/L (ref 0–37)
Albumin: 4.2 g/dL (ref 3.5–5.2)
Alkaline Phosphatase: 75 U/L (ref 39–117)
Bilirubin, Direct: 0.1 mg/dL (ref 0.0–0.3)
Total Bilirubin: 0.4 mg/dL (ref 0.2–1.2)
Total Protein: 7.2 g/dL (ref 6.0–8.3)

## 2021-08-08 LAB — HEMOGLOBIN A1C: Hgb A1c MFr Bld: 6.3 % (ref 4.6–6.5)

## 2021-08-10 ENCOUNTER — Other Ambulatory Visit: Payer: Self-pay

## 2021-08-10 ENCOUNTER — Encounter: Payer: Self-pay | Admitting: Internal Medicine

## 2021-08-10 ENCOUNTER — Ambulatory Visit (INDEPENDENT_AMBULATORY_CARE_PROVIDER_SITE_OTHER): Payer: Medicare HMO | Admitting: Internal Medicine

## 2021-08-10 DIAGNOSIS — K649 Unspecified hemorrhoids: Secondary | ICD-10-CM | POA: Diagnosis not present

## 2021-08-10 DIAGNOSIS — R739 Hyperglycemia, unspecified: Secondary | ICD-10-CM | POA: Diagnosis not present

## 2021-08-10 DIAGNOSIS — I10 Essential (primary) hypertension: Secondary | ICD-10-CM

## 2021-08-10 DIAGNOSIS — E785 Hyperlipidemia, unspecified: Secondary | ICD-10-CM

## 2021-08-10 DIAGNOSIS — D473 Essential (hemorrhagic) thrombocythemia: Secondary | ICD-10-CM

## 2021-08-10 DIAGNOSIS — Z713 Dietary counseling and surveillance: Secondary | ICD-10-CM | POA: Diagnosis not present

## 2021-08-10 DIAGNOSIS — Z96651 Presence of right artificial knee joint: Secondary | ICD-10-CM | POA: Diagnosis not present

## 2021-08-10 MED ORDER — HYDROCORT-PRAMOXINE (PERIANAL) 2.5-1 % EX CREA: 1.0000 "application " | TOPICAL_CREAM | Freq: Two times a day (BID) | CUTANEOUS | 0 refills | Status: DC | PRN

## 2021-08-10 MED ORDER — MELOXICAM 15 MG PO TABS: ORAL_TABLET | ORAL | 1 refills | Status: DC

## 2021-08-10 NOTE — Progress Notes (Signed)
[name], female   DOB: 1946-02-18, 76 y.o.   MRN: [id number]   Subjective:    [name], female    DOB: 1945/08/17, 76 y.o.   MRN: [id number]  This visit occurred during the SARS-CoV-2 public health emergency.  Safety protocols were in place, including screening questions prior to the visit, additional usage of staff PPE, and extensive cleaning of exam room while observing appropriate contact time as indicated for disinfecting solutions.   Patient here for a scheduled follow up.   Chief Complaint  Patient presents with   Hypertension   Hyperlipidemia   .   HPI Reports she is doing relatively well.  Having issues with hemorrhoids.  Request rx cream.  Discussed surgery evaluation.  Will notify me if desires.  Declines currently.  Tries to stay active. Walking.  No chest pain or sob reported.  No abdominal pain or bowel change reported.  Has seen Dr Sharlet Salina.  Taking mobic.  Request refill.  Discussed possible side effects.  Previously on ozempic.  Did not help.  Discussed other weight loss options.  She is interested.  Discussed diet and exercise.     Past Medical History:  Diagnosis Date   Blood in the stool    10 yrs ago   COVID-19 07/01/2021   Runny nose 12/15. (+) test 12/17.  Symptom resolution 07/02/21.   Diverticulitis    GERD (gastroesophageal reflux disease)    History of abnormal Pap smear    History of chicken pox    History of colon polyps    History of kidney stones    h/o    Hypercholesterolemia    Hypertension    Lumbosacral spondylolysis    Seronegative rheumatoid arthritis [facility])    Past Surgical History:  Procedure Laterality Date   CATARACT EXTRACTION W/PHACO Right 07/12/2021   Procedure: CATARACT EXTRACTION PHACO AND INTRAOCULAR LENS PLACEMENT ([place]) RIGHT TORIC LENS;  Surgeon: Leandrew Koyanagi, MD;  Location: [place];  Service: Ophthalmology;  Laterality: Right;  7.70 1:08.7   CATARACT EXTRACTION W/PHACO Left  07/26/2021   Procedure: CATARACT EXTRACTION PHACO AND INTRAOCULAR LENS PLACEMENT ([place]) LEFT TORIC LENS 6.24 01:02.2;  Surgeon: Leandrew Koyanagi, MD;  Location: [place];  Service: Ophthalmology;  Laterality: Left;   COLONOSCOPY     DILATION AND CURETTAGE OF UTERUS     KNEE ARTHROPLASTY Right 05/13/2019   Procedure: COMPUTER ASSISTED TOTAL KNEE ARTHROPLASTY RIGHT;  Surgeon: Dereck Leep, MD;  Location: [facility];  Service: Orthopedics;  Laterality: Right;   LEG / ANKLE SOFT TISSUE BIOPSY  2011   to confirm arthritis   Family History  Problem Relation Age of Onset   Arthritis Father    Hyperlipidemia Father    Heart disease Father    Hypertension Father    Diabetes Father    Pulmonary fibrosis Father    Arthritis Mother    Heart disease Mother    Hyperlipidemia Mother    Hypertension Mother    Colon cancer Maternal Aunt    Colon cancer Maternal Uncle    Breast cancer Neg Hx    Social History   Socioeconomic History   Marital status: Divorced    Spouse name: Not on file   Number of children: Not on file   Years of education: Not on file   Highest education level: Not on file  Occupational History   Not on file  Tobacco Use   Smoking status: Never   Smokeless tobacco:  Never  Vaping Use   Vaping Use: Never used  Substance and Sexual Activity   Alcohol use: Yes    Alcohol/week: 0.0 standard drinks    Comment: rare   Drug use: No   Sexual activity: Not on file  Other Topics Concern   Not on file  Social History Narrative   Not on file   Social Determinants of Health   Financial Resource Strain: Low Risk    Difficulty of Paying Living Expenses: Not hard at all  Food Insecurity: No Food Insecurity   Worried About Charity fundraiser in the Last Year: Never true   [place] in the Last Year: Never true  Transportation Needs: No Transportation Needs   Lack of Transportation (Medical): No   Lack of Transportation (Non-Medical): No  Physical  Activity: Sufficiently Active   Days of Exercise per Week: 5 days   Minutes of Exercise per Session: 30 min  Stress: No Stress Concern Present   Feeling of Stress : Not at all  Social Connections: Unknown   Frequency of Communication with Friends and Family: More than three times a week   Frequency of Social Gatherings with Friends and Family: More than three times a week   Attends Religious Services: Not on Electrical engineer or Organizations: Not on file   Attends Archivist Meetings: Not on file   Marital Status: Not on file     Review of Systems  Constitutional:  Negative for appetite change and unexpected weight change.  HENT:  Negative for congestion and sinus pressure.   Respiratory:  Negative for cough, chest tightness and shortness of breath.   Cardiovascular:  Negative for chest pain, palpitations and leg swelling.  Gastrointestinal:  Negative for abdominal pain, diarrhea, nausea and vomiting.  Genitourinary:  Negative for difficulty urinating and dysuria.  Musculoskeletal:  Negative for joint swelling and myalgias.  Skin:  Negative for color change and rash.  Neurological:  Negative for dizziness, light-headedness and headaches.  Psychiatric/Behavioral:  Negative for agitation and dysphoric mood.       Objective:     BP 132/76    Pulse 100    Temp 97.6 F (36.4 C)    Resp 16    Ht '5\' 1"'  (1.549 m)    Wt 172 lb (78 kg)    LMP 07/16/1996    SpO2 98%    BMI 32.50 kg/m  Wt Readings from Last 3 Encounters:  08/10/21 172 lb (78 kg)  07/26/21 172 lb (78 kg)  07/12/21 172 lb 11.2 oz (78.3 kg)    Physical Exam Vitals reviewed.  Constitutional:      General: She is not in acute distress.    Appearance: Normal appearance.  HENT:     Head: Normocephalic and atraumatic.     Right Ear: External ear normal.     Left Ear: External ear normal.  Eyes:     General: No scleral icterus.       Right eye: No discharge.        Left eye: No discharge.      Conjunctiva/sclera: Conjunctivae normal.  Neck:     Thyroid: No thyromegaly.  Cardiovascular:     Rate and Rhythm: Normal rate and regular rhythm.  Pulmonary:     Effort: No respiratory distress.     Breath sounds: Normal breath sounds. No wheezing.  Abdominal:     General: Bowel sounds are normal.  Palpations: Abdomen is soft.     Tenderness: There is no abdominal tenderness.  Genitourinary:    Comments: Declines rectal exam.  Musculoskeletal:        General: No swelling or tenderness.     Cervical back: Neck supple. No tenderness.  Lymphadenopathy:     Cervical: No cervical adenopathy.  Skin:    Findings: No erythema or rash.  Neurological:     Mental Status: She is alert.  Psychiatric:        Mood and Affect: Mood normal.        Behavior: Behavior normal.     Outpatient Encounter Medications as of 08/10/2021  Medication Sig   aspirin EC 81 MG tablet Take 81 mg by mouth daily. Take 2 tablets daily.   calcium carbonate (TUMS EX) 750 MG chewable tablet Chew 1 tablet by mouth daily.   diphenhydramine-acetaminophen (TYLENOL PM) 25-500 MG TABS tablet Take 1 tablet by mouth at bedtime.   hydrocortisone-pramoxine (ANALPRAM HC) 2.5-1 % rectal cream Place 1 application rectally 2 (two) times daily as needed for hemorrhoids or anal itching.   lisinopril (ZESTRIL) 10 MG tablet Take 1 tablet (10 mg total) by mouth daily.   meloxicam (MOBIC) 15 MG tablet Take one tablet qod   Multiple Vitamin (MULTIVITAMIN WITH MINERALS) TABS tablet Take 1 tablet by mouth daily.   pantoprazole (PROTONIX) 40 MG tablet Take 1 tablet (40 mg total) by mouth daily.   Probiotic Product (PROBIOTIC PO) Take 1 capsule by mouth daily.   simvastatin (ZOCOR) 20 MG tablet Take 1 tablet (20 mg total) by mouth every evening.   traMADol (ULTRAM) 50 MG tablet 1-2 po bid prn   [DISCONTINUED] hydrocortisone (ANUSOL-HC) 25 MG suppository Place 1 suppository (25 mg total) rectally 2 (two) times daily. (Patient not  taking: Reported on 07/03/2021)   [DISCONTINUED] hydrocortisone-pramoxine (ANALPRAM HC) 2.5-1 % rectal cream Place 1 application rectally 2 (two) times daily as needed for hemorrhoids or anal itching. (Patient not taking: Reported on 07/03/2021)   [DISCONTINUED] meloxicam (MOBIC) 15 MG tablet Take one tablet qod   [DISCONTINUED] Omega-3 Fatty Acids (FISH OIL) 1200 MG CAPS Take 1,200 mg by mouth daily. (Patient not taking: Reported on 07/03/2021)   No facility-administered encounter medications on file as of 08/10/2021.     Lab Results  Component Value Date   WBC 6.6 08/08/2021   HGB 14.3 08/08/2021   HCT 43.7 08/08/2021   PLT 529.0 (H) 08/08/2021   GLUCOSE 115 (H) 08/08/2021   CHOL 149 08/08/2021   TRIG 135.0 08/08/2021   HDL 40.60 08/08/2021   LDLDIRECT 153.7 11/15/2010   LDLCALC 82 08/08/2021   ALT 33 08/08/2021   AST 27 08/08/2021   NA 139 08/08/2021   K 4.6 08/08/2021   CL 104 08/08/2021   CREATININE 0.87 08/08/2021   BUN 18 08/08/2021   CO2 29 08/08/2021   TSH 2.72 11/18/2020   INR 0.9 07/15/2019   HGBA1C 6.3 08/08/2021       Assessment & Plan:   Problem List Items Addressed This Visit     Essential thrombocytosis (HCC)    Minimal elevation.  Has seen hematology.  Recommended continued f/u.  Follow cbc to confirm stable.       Hemorrhoid    Hemorrhoid issue as outlined.  Declines for me to evaluate.  Had recent colonoscopy.  Analpram.  Follow.  Notify if persistent.         Hyperglycemia    Low carb diet and exercise.  Follow  met b and a1c.        Relevant Orders   Hemoglobin A1c   Hyperlipidemia    Continue simvastatin.  Low cholesterol diet and exercise.  Follow lipid panel and liver function tests.       Relevant Orders   CBC with Differential/Platelet   Hepatic function panel   TSH   Lipid panel   Hypertension    Continue lisinopril.  Blood pressure as outlined.  No changes pressures.  Follow metabolic panel.       Relevant Orders   Basic  metabolic panel   Total knee replacement status    Stable.  Follow.       Weight loss counseling, encounter for    Previously on ozempic.  Is interested if there is another injectable medication that would be better for her - regarding weight loss.  History of pre diabetes.  Highest a1c 6.4.  Discussed diet and exercise.  Will discuss with Catie regarding insurance coverage and out of pocket cost for pt.          Einar Pheasant, MD

## 2021-08-18 ENCOUNTER — Encounter: Payer: Self-pay | Admitting: Internal Medicine

## 2021-08-20 ENCOUNTER — Encounter: Payer: Self-pay | Admitting: Internal Medicine

## 2021-08-20 ENCOUNTER — Telehealth: Payer: Self-pay | Admitting: Internal Medicine

## 2021-08-20 DIAGNOSIS — Z713 Dietary counseling and surveillance: Secondary | ICD-10-CM | POA: Insufficient documentation

## 2021-08-20 DIAGNOSIS — R7303 Prediabetes: Secondary | ICD-10-CM

## 2021-08-20 NOTE — Assessment & Plan Note (Signed)
Continue simvastatin.  Low cholesterol diet and exercise.  Follow lipid panel and liver function tests.   

## 2021-08-20 NOTE — Assessment & Plan Note (Signed)
Previously on ozempic.  Is interested if there is another injectable medication that would be better for her - regarding weight loss.  History of pre diabetes.  Highest a1c 6.4.  Discussed diet and exercise.  Will discuss with Catie regarding insurance coverage and out of pocket cost for pt.

## 2021-08-20 NOTE — Assessment & Plan Note (Signed)
Continue lisinopril.  Blood pressure as outlined.  No changes pressures.  Follow metabolic panel.

## 2021-08-20 NOTE — Assessment & Plan Note (Signed)
Hemorrhoid issue as outlined.  Declines for me to evaluate.  Had recent colonoscopy.  Analpram.  Follow.  Notify if persistent.

## 2021-08-20 NOTE — Assessment & Plan Note (Signed)
Stable.  Follow.   

## 2021-08-20 NOTE — Assessment & Plan Note (Signed)
Minimal elevation.  Has seen hematology.  Recommended continued f/u.  Follow cbc to confirm stable.  

## 2021-08-20 NOTE — Assessment & Plan Note (Signed)
Low carb diet and exercise.  Follow met b and a1c.

## 2021-08-20 NOTE — Telephone Encounter (Signed)
Caitlin Bennett was previously on ozempic.  Did not lose much weight.  Is interested in trying a different GLP - 1 agonist.  I was not sure regarding insurance coverage or out of pocket cost for her.  In reviewing, her highest a1c 6.4.  I can place CCM referral if needed.

## 2021-08-21 NOTE — Addendum Note (Signed)
Addended by: Lars Masson on: 08/21/2021 09:21 AM   Modules accepted: Orders

## 2021-08-21 NOTE — Telephone Encounter (Signed)
Discussed and referral placed to Catie

## 2021-08-21 NOTE — Telephone Encounter (Signed)
Discussed with patient. Referral placed to Caitlin Bennett

## 2021-08-22 ENCOUNTER — Telehealth: Payer: Self-pay

## 2021-08-22 NOTE — Chronic Care Management (AMB) (Signed)
Chronic Care Management   Note  08/22/2021 Name: LATONYA NELON MRN: [id number] DOB: [birth year]  KAYLIANNA DETERT is a 76 y.o. year old female who is a primary care patient of Einar Pheasant, MD. I reached out to Leilani Able by phone today in response to a referral sent by Ms. Hilbert Corrigan Bickle's PCP.  Ms. Faraci was given information about Chronic Care Management services today including:  CCM service includes personalized support from designated clinical staff supervised by her physician, including individualized plan of care and coordination with other care providers 24/7 contact phone numbers for assistance for urgent and routine care needs. Service will only be billed when office clinical staff spend 20 minutes or more in a month to coordinate care. Only one practitioner may furnish and bill the service in a calendar month. The patient may stop CCM services at any time (effective at the end of the month) by phone call to the office staff. The patient is responsible for co-pay (up to 20% after annual deductible is met) if co-pay is required by the individual health plan.   Patient agreed to services and verbal consent obtained.   Follow up plan: Telephone appointment with care management team member scheduled for:09/06/2021  Noreene Larsson, [place], [place], [place] [address] Direct Dial: [phone] [email or web address] Website: [email or web address]

## 2021-08-23 IMAGING — CT CT BIOPSY AND ASPIRATION BONE MARROW
1 of 2 series · 9 of 14 positions shown, 12 images · non-contrast
Comparison: none

INDICATION: Thrombocytosis of uncertain etiology. Please perform CT-guided bone
marrow biopsy for tissue diagnostic purposes.

[Series 2: i-spiral 5.0 b30f · axial · 0.87mm/px · z∈[-218,-113]mm · 9 of 38 slices shown, 12 images]
[im 4/38  soft-tissue]
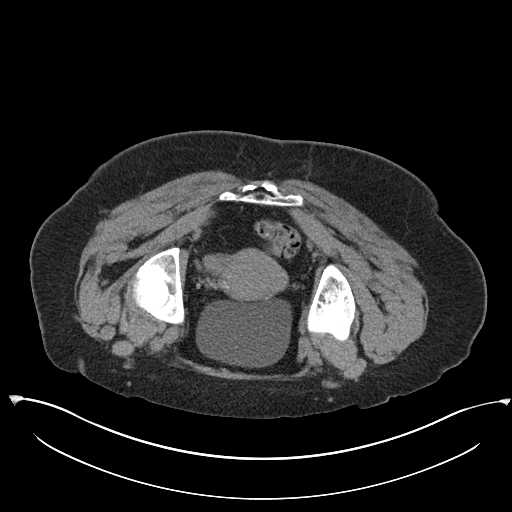
[im 4/38  bone]
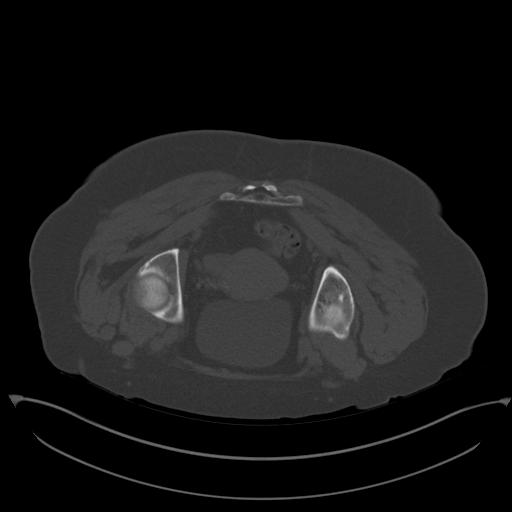
[im 8/38  bone]
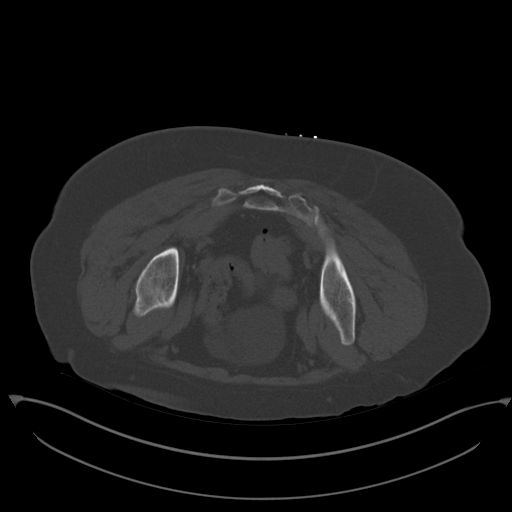
[im 12/38  bone]
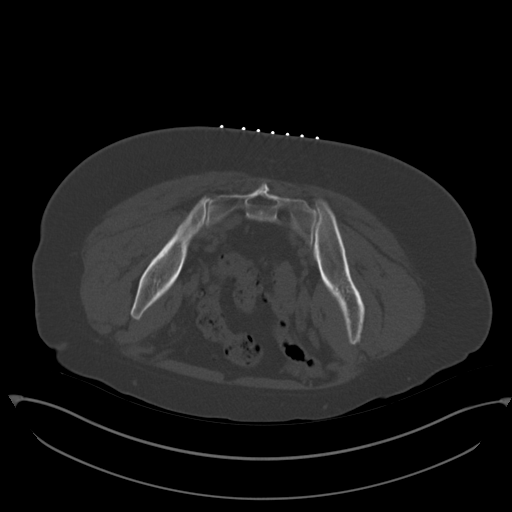
[im 15/38  bone]
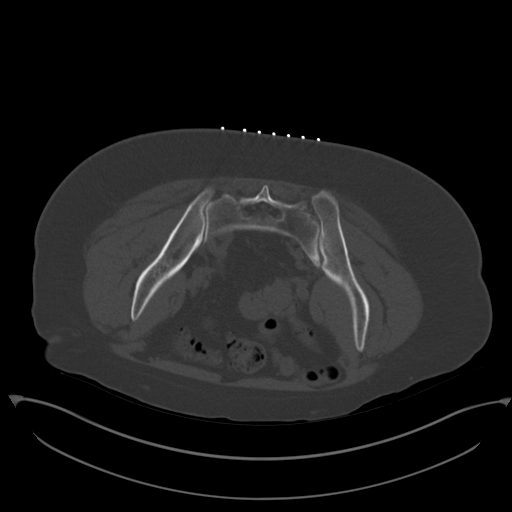
[im 19/38  soft-tissue]
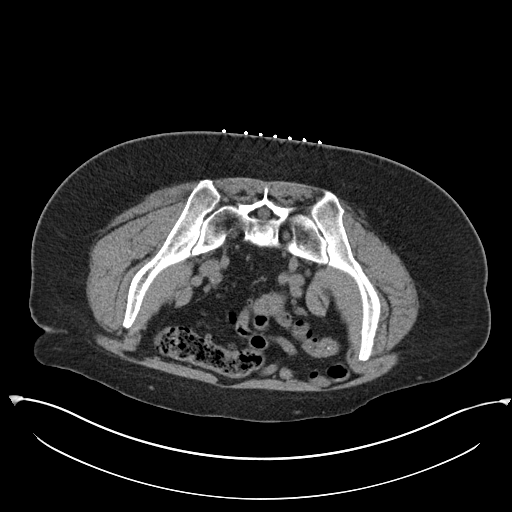
[im 19/38  bone]
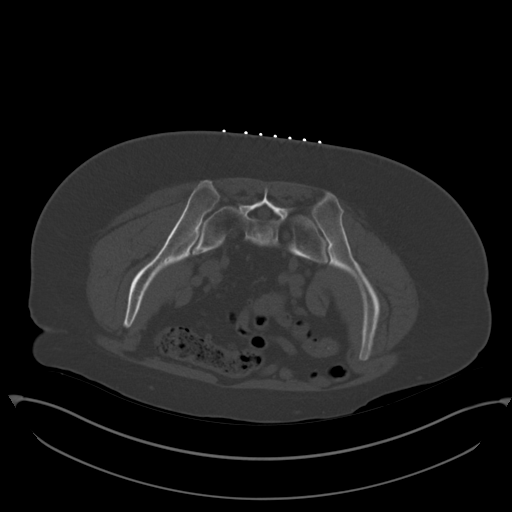
[im 23/38  bone]
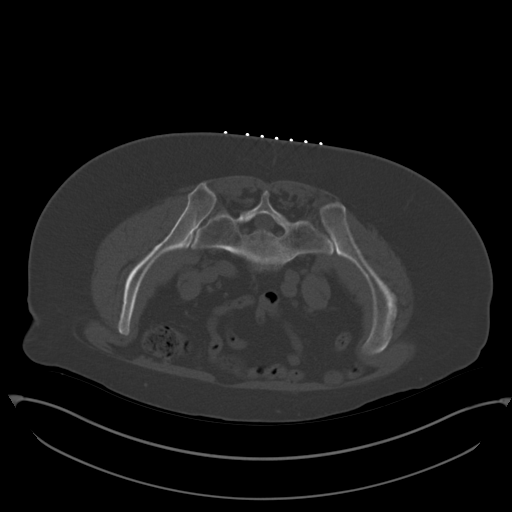
[im 26/38  bone]
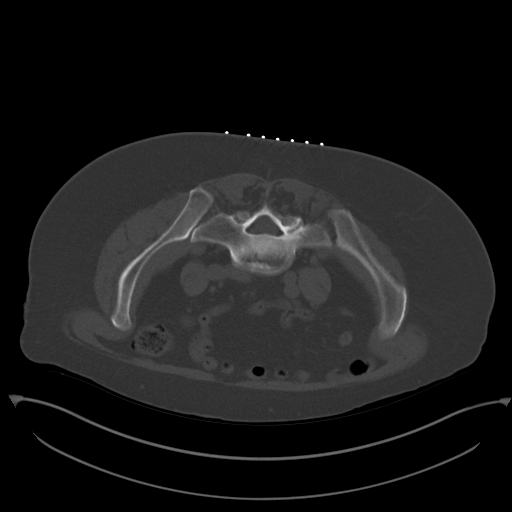
[im 30/38  bone]
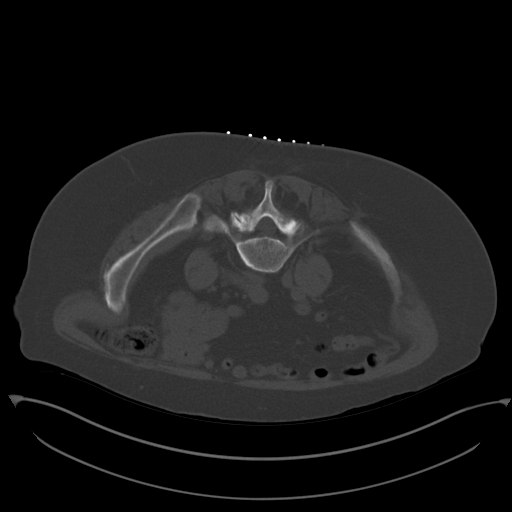
[im 34/38  soft-tissue]
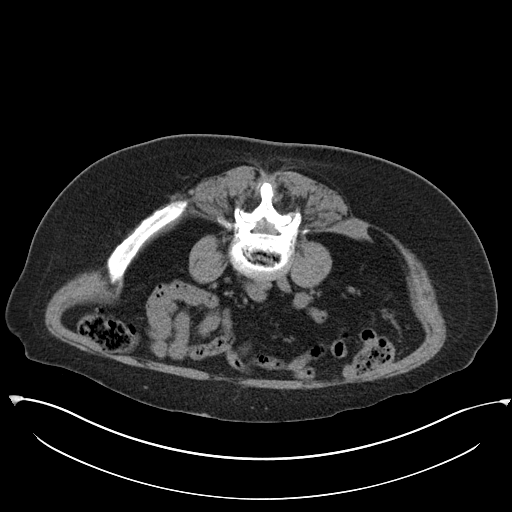
[im 34/38  bone]
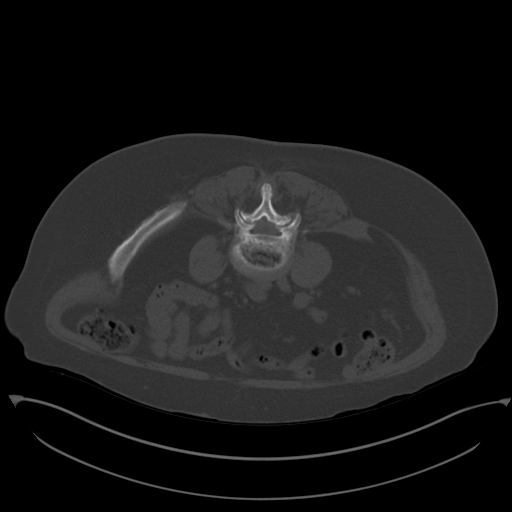

[9 of 14 positions shown; findings below may reference images not displayed]

EXAM:
CT-GUIDED BONE MARROW BIOPSY AND ASPIRATION

MEDICATIONS:
None

ANESTHESIA/SEDATION:
Fentanyl 100 mcg IV; Versed 2 mg IV

Sedation Time: 11 Minutes; The patient was continuously monitored
during the procedure by the interventional radiology nurse under my
direct supervision.

COMPLICATIONS:
None immediate.

PROCEDURE:
Informed consent was obtained from the patient following an
explanation of the procedure, risks, benefits and alternatives. The
patient understands, agrees and consents for the procedure. All
questions were addressed. A time out was performed prior to the
initiation of the procedure. The patient was positioned prone and
non-contrast localization CT was performed of the pelvis to
demonstrate the iliac marrow spaces. The operative site was prepped
and draped in the usual sterile fashion.

Under sterile conditions and local anesthesia, a 22 gauge spinal
needle was utilized for procedural planning. Next, an 11 gauge
coaxial bone biopsy needle was advanced into the left iliac marrow
space. Needle position was confirmed with CT imaging. Initially,
bone marrow aspiration was performed. Next, a bone marrow biopsy was
obtained with the 11 gauge outer bone marrow device. The 11 gauge
coaxial bone biopsy needle was re-advanced into a slightly different
location within the left iliac marrow space, positioning was
confirmed and an additional bone marrow biopsy was obtained. The
needle was removed intact. Superficial hemostasis was obtained with
compression and a dressing was placed. The patient tolerated the
procedure well without immediate post procedural complication.
IMPRESSION: Successful CT guided left iliac bone marrow aspiration and core
biopsy.

## 2021-08-27 ENCOUNTER — Other Ambulatory Visit: Payer: Self-pay | Admitting: Internal Medicine

## 2021-08-27 ENCOUNTER — Encounter: Payer: Self-pay | Admitting: Internal Medicine

## 2021-09-04 DIAGNOSIS — L570 Actinic keratosis: Secondary | ICD-10-CM | POA: Diagnosis not present

## 2021-09-04 DIAGNOSIS — Z86018 Personal history of other benign neoplasm: Secondary | ICD-10-CM | POA: Diagnosis not present

## 2021-09-04 DIAGNOSIS — L821 Other seborrheic keratosis: Secondary | ICD-10-CM | POA: Diagnosis not present

## 2021-09-04 DIAGNOSIS — L738 Other specified follicular disorders: Secondary | ICD-10-CM | POA: Diagnosis not present

## 2021-09-04 DIAGNOSIS — L578 Other skin changes due to chronic exposure to nonionizing radiation: Secondary | ICD-10-CM | POA: Diagnosis not present

## 2021-09-04 DIAGNOSIS — Z872 Personal history of diseases of the skin and subcutaneous tissue: Secondary | ICD-10-CM | POA: Diagnosis not present

## 2021-09-06 ENCOUNTER — Ambulatory Visit (INDEPENDENT_AMBULATORY_CARE_PROVIDER_SITE_OTHER): Payer: Medicare HMO | Admitting: Pharmacist

## 2021-09-06 ENCOUNTER — Other Ambulatory Visit: Payer: Self-pay | Admitting: Internal Medicine

## 2021-09-06 DIAGNOSIS — E785 Hyperlipidemia, unspecified: Secondary | ICD-10-CM

## 2021-09-06 DIAGNOSIS — R7303 Prediabetes: Secondary | ICD-10-CM

## 2021-09-06 DIAGNOSIS — I10 Essential (primary) hypertension: Secondary | ICD-10-CM

## 2021-09-06 DIAGNOSIS — Z6831 Body mass index (BMI) 31.0-31.9, adult: Secondary | ICD-10-CM

## 2021-09-06 MED ORDER — METFORMIN HCL ER 500 MG PO TB24: 500.0000 mg | ORAL_TABLET | Freq: Two times a day (BID) | ORAL | 1 refills | Status: DC

## 2021-09-06 NOTE — Chronic Care Management (AMB) (Signed)
Chronic Care Management CCM Pharmacy Note  09/06/2021 Name:  Caitlin Bennett MRN:  [id number] DOB:  06/01/1946  Summary: - Unable to achieve goal weight loss, prediabetes  Recommendations/Changes made from today's visit: - Start metformin XR 500 mg twice daily  Subjective: Caitlin Bennett is an 76 y.o. year old female who is a primary patient of Caitlin Pheasant, MD.  The CCM team was consulted for assistance with disease management and care coordination needs.    Engaged with patient by telephone for follow up visit for pharmacy case management and/or care coordination services.   Objective:  Medications Reviewed Today     Reviewed by De Hollingshead, RPH-CPP (Pharmacist) on 09/06/21 at [phone]  Med List Status: <None>   Medication Order Taking? Sig Documenting Provider Last Dose Status Informant  aspirin EC 81 MG tablet [id number] Yes Take 81 mg by mouth daily. [provider] Taking Active   calcium carbonate (TUMS EX) 750 MG chewable tablet [id number] Yes Chew 1 tablet by mouth daily. [provider] Taking Active Self  diphenhydramine-acetaminophen (TYLENOL PM) 25-500 MG TABS tablet [id number] Yes Take 1 tablet by mouth at bedtime. [provider] Taking Active Self  lisinopril (ZESTRIL) 10 MG tablet [id number] Yes TAKE 1 TABLET DAILY Caitlin Pheasant, MD Taking Active   meloxicam (MOBIC) 15 MG tablet [id number] Yes Take one tablet qod Caitlin Pheasant, MD Taking Active   Multiple Vitamin (MULTIVITAMIN WITH MINERALS) TABS tablet [id number] Yes Take 1 tablet by mouth daily. [provider] Taking Active Self  Omega-3 Fatty Acids (FISH OIL) 1000 MG CAPS [id number] Yes Take 1 capsule by mouth daily. [provider] Taking Active   pantoprazole (PROTONIX) 40 MG tablet [id number] Yes Take 1 tablet (40 mg total) by mouth daily. Caitlin Pheasant, MD Taking Active   Probiotic Product (PROBIOTIC PO) [id number] Yes Take 1 capsule by mouth daily. [provider] Taking Active Self  simvastatin (ZOCOR) 20 MG tablet [id number] Yes TAKE 1 TABLET EVERY EVENING Dutch Quint B, FNP Taking Active   traMADol (ULTRAM) 50 MG tablet [id number] Yes 1-2 po bid prn [provider] Taking Active   vitamin E 180 MG (400 UNITS) capsule [id number] Yes Take 400 Units by mouth daily. [provider] Taking Active             Pertinent Labs:   Lab Results  Component Value Date   HGBA1C 6.3 08/08/2021   Lab Results  Component Value Date   CHOL 149 08/08/2021   HDL 40.60 08/08/2021   LDLCALC 82 08/08/2021   LDLDIRECT 153.7 11/15/2010   TRIG 135.0 08/08/2021   CHOLHDL 4 08/08/2021   Lab Results  Component Value Date   CREATININE 0.87 08/08/2021   BUN 18 08/08/2021   NA 139 08/08/2021   K 4.6 08/08/2021   CL 104 08/08/2021   CO2 29 08/08/2021    SDOH:  (Social Determinants of Health) assessments and interventions performed:  SDOH Interventions    Flowsheet Row Most Recent Value  SDOH Interventions   Financial Strain Interventions Intervention Not Indicated       CCM Care Plan  Review of patient past medical history, allergies, medications, health status, including review of consultants reports, laboratory and other test data, was performed as part of comprehensive evaluation and provision of chronic care management services.   Care Plan : Medication Management  Updates made by De Hollingshead, RPH-CPP since 09/06/2021 12:00 AM     Problem: Diabetes  Long-Range Goal: Disease Progression Prevention   Note:   Current Barriers:  Unable to achieve control of weight   Pharmacist Clinical Goal(s):  patient will achieve control of weight through collaboration with PharmD and provider.    Interventions: 1:1 collaboration with Caitlin Pheasant, MD regarding development and update of comprehensive plan of care as evidenced by provider attestation and co-signature Inter-disciplinary care team collaboration  (see longitudinal plan of care) Comprehensive medication review performed; medication list updated in electronic medical record  Health Maintenance   Yearly diabetic eye exam: up to date Yearly diabetic foot exam: up to date Urine microalbumin: up to date Yearly influenza vaccination: up to date Td/Tdap vaccination: up to date Pneumonia vaccination: up to date COVID vaccinations: up to date Shingrix vaccinations: up to date Colonoscopy: up to date Bone density scan: up to date Mammogram: up to date  Pre-diabetes and Obesity:  Unable to achieve goal weight loss 5-10% current treatment:none;  Hx Ozempic - little benefit of 0.5 mg weekly; phentermine  Current meal patterns: breakfast: egg, bacon; lunch: meat w/o bread, sometimes sandwich; dinner: pasta, fish and chicken; wants to work on vegetables; snacks: weakness is desserts - cookies after supper; nuts; drinks: flavored water, sometimes juice in the morning with medications Current exercise: walks dog a mile daily, working up to twice daily  Discussed dietary modifications including focus on lean proteins, fruits and vegetables, whole grains, fibers, and hydration Discussed goal moderate intensity physical activity at least 150 minutes weekly.  Discussed role of GLP1 agents in weight management, however, Medicare does not cover agents labeled for weight management. Discussed challenges of access with off label use of GLP1. Discussed that she may have benefited from higher doses of Ozempic, however, may not be able to access given insurance coverage. Discussed role of metformin in pre-diabetes. Patient amenable. Start metformin XR twice daily with meals. Counseled on risk of diarrhea in the first week.   Hypertension:   Controlled; current treatment: lisinopril 10 mg daily Recommended to continue current regimen at this time  Hyperlipidemia:   Controlled at goal <100; current treatment: simvastatin 20 mg  Recommended to continue  current regimen at this time  Osteoarthritis: Moderately well controlled; meloxicam 15 mg every other day, tylenol PM QPM, tramadol 50 mg daily Reviewed side effects related to diphenhydramine. Advised to consider trying regular acetaminophen instead, as patient does report concerns with anticholinergic side effects such as dry mouth, constipation.   Patient Goals/Self-Care Activities patient will:  - take medications as prescribed as evidenced by patient report and record review target a minimum of 150 minutes of moderate intensity exercise weekly engage in dietary modifications by moderating carbohydrate intake       Plan: Telephone follow up appointment with care management team member scheduled for:  8 weeks  [name], PharmD, [place], CPP Clinical Pharmacist [employer] [facility] [employer] [phone]

## 2021-09-06 NOTE — Patient Instructions (Signed)
Leontine,   It was great talking to you today!  Start metformin XR 500 mg twice daily. I recommend you take metformin with food. It can cause some stomach upset or diarrhea, but this generally resolves in the first week.   Consider cutting back on the use of Benadryl (in Tylenol PM) due to long term concerns with dizziness, sedation, and things like dry mouth, dry eyes, constipation, or urinary retention.   Let me know if you have any questions or concerns.   Take care,   [name], PharmD  Visit Information   Following is a copy of your full care plan:  Care Plan : Medication Management  Updates made by De Hollingshead, RPH-CPP since 09/06/2021 12:00 AM     Problem: Diabetes      Long-Range Goal: Disease Progression Prevention   Note:   Current Barriers:  Unable to achieve control of weight   Pharmacist Clinical Goal(s):  patient will achieve control of weight through collaboration with PharmD and provider.    Interventions: 1:1 collaboration with Einar Pheasant, MD regarding development and update of comprehensive plan of care as evidenced by provider attestation and co-signature Inter-disciplinary care team collaboration (see longitudinal plan of care) Comprehensive medication review performed; medication list updated in electronic medical record  Health Maintenance   Yearly diabetic eye exam: up to date Yearly diabetic foot exam: up to date Urine microalbumin: up to date Yearly influenza vaccination: up to date Td/Tdap vaccination: up to date Pneumonia vaccination: up to date COVID vaccinations: up to date Shingrix vaccinations: up to date Colonoscopy: up to date Bone density scan: up to date Mammogram: up to date  Pre-diabetes and Obesity:  Unable to achieve goal weight loss 5-10% current treatment:none;  Hx Ozempic - little benefit of 0.5 mg weekly; phentermine  Current meal patterns: breakfast: egg, bacon; lunch: meat w/o bread, sometimes sandwich;  dinner: pasta, fish and chicken; wants to work on vegetables; snacks: weakness is desserts - cookies after supper; nuts; drinks: flavored water, sometimes juice in the morning with medications Current exercise: walks dog a mile daily, working up to twice daily  Discussed dietary modifications including focus on lean proteins, fruits and vegetables, whole grains, fibers, and hydration Discussed goal moderate intensity physical activity at least 150 minutes weekly.  Discussed role of GLP1 agents in weight management, however, Medicare does not cover agents labeled for weight management. Discussed challenges of access with off label use of GLP1. Discussed that she may have benefited from higher doses of Ozempic, however, may not be able to access given insurance coverage. Discussed role of metformin in pre-diabetes. Patient amenable. Start metformin XR twice daily with meals. Counseled on risk of diarrhea in the first week.   Hypertension:   Controlled; current treatment: lisinopril 10 mg daily Recommended to continue current regimen at this time  Hyperlipidemia:   Controlled at goal <100; current treatment: simvastatin 20 mg  Recommended to continue current regimen at this time  Osteoarthritis: Moderately well controlled; meloxicam 15 mg every other day, tylenol PM QPM, tramadol 50 mg daily Reviewed side effects related to diphenhydramine. Advised to consider trying regular acetaminophen instead, as patient does report concerns with anticholinergic side effects such as dry mouth, constipation.   Patient Goals/Self-Care Activities patient will:  - take medications as prescribed as evidenced by patient report and record review target a minimum of 150 minutes of moderate intensity exercise weekly engage in dietary modifications by moderating carbohydrate intake      Consent to  CCM Services: Ms. Hanneman was given information about Chronic Care Management services including:  CCM service  includes personalized support from designated clinical staff supervised by her physician, including individualized plan of care and coordination with other care providers 24/7 contact phone numbers for assistance for urgent and routine care needs. Service will only be billed when office clinical staff spend 20 minutes or more in a month to coordinate care. Only one practitioner may furnish and bill the service in a calendar month. The patient may stop CCM services at any time (effective at the end of the month) by phone call to the office staff. The patient will be responsible for cost sharing (co-pay) of up to 20% of the service fee (after annual deductible is met).  Patient agreed to services and verbal consent obtained.   Plan: Telephone follow up appointment with care management team member scheduled for:  8 weeks  [name], PharmD, [place], CPP Clinical Pharmacist [place] at [facility] [phone]   Please call the care guide team at [phone] if you need to cancel or reschedule your appointment.   Patient verbalizes understanding of instructions and care plan provided today and agrees to view in [place]. Active MyChart status confirmed with patient.

## 2021-09-12 DIAGNOSIS — E785 Hyperlipidemia, unspecified: Secondary | ICD-10-CM | POA: Diagnosis not present

## 2021-09-12 DIAGNOSIS — I10 Essential (primary) hypertension: Secondary | ICD-10-CM

## 2021-09-19 ENCOUNTER — Ambulatory Visit: Payer: Self-pay | Admitting: Pharmacist

## 2021-09-19 NOTE — Chronic Care Management (AMB) (Signed)
?  Chronic Care Management  ? ?Note ? ?09/19/2021 ?Name: Caitlin Bennett MRN: [id number] DOB: 08/18/1945 ? ? ? ?Closing pharmacy CCM case at this time. Will collaborate with Care Guide to outreach to schedule follow up with RN CM. Patient has clinic contact information for future questions or concerns.  ? ?[name], PharmD, [place], CPP ?Clinical Pharmacist ?Therapist, music at [employer] ?[phone] ? ?

## 2021-10-25 ENCOUNTER — Telehealth: Payer: Medicare HMO

## 2021-12-04 ENCOUNTER — Ambulatory Visit: Payer: Medicare HMO | Admitting: Internal Medicine

## 2021-12-05 ENCOUNTER — Other Ambulatory Visit (INDEPENDENT_AMBULATORY_CARE_PROVIDER_SITE_OTHER): Payer: Medicare HMO

## 2021-12-05 DIAGNOSIS — R739 Hyperglycemia, unspecified: Secondary | ICD-10-CM | POA: Diagnosis not present

## 2021-12-05 DIAGNOSIS — E785 Hyperlipidemia, unspecified: Secondary | ICD-10-CM | POA: Diagnosis not present

## 2021-12-05 DIAGNOSIS — I10 Essential (primary) hypertension: Secondary | ICD-10-CM | POA: Diagnosis not present

## 2021-12-05 LAB — BASIC METABOLIC PANEL
BUN: 19 mg/dL (ref 6–23)
CO2: 29 mEq/L (ref 19–32)
Calcium: 9.6 mg/dL (ref 8.4–10.5)
Chloride: 102 mEq/L (ref 96–112)
Creatinine, Ser: 0.78 mg/dL (ref 0.40–1.20)
GFR: 73.36 mL/min (ref >60.00)
Glucose, Bld: 102 mg/dL — ABNORMAL HIGH (ref 70–99)
Potassium: 4.2 mEq/L (ref 3.5–5.1)
Sodium: 140 mEq/L (ref 135–145)

## 2021-12-05 LAB — LIPID PANEL
Cholesterol: 163 mg/dL (ref 0–200)
HDL: 41.2 mg/dL (ref >39.00)
LDL Cholesterol: 90 mg/dL (ref 0–99)
NonHDL: 121.59
Total CHOL/HDL Ratio: 4
Triglycerides: 160 mg/dL — ABNORMAL HIGH (ref 0.0–149.0)
VLDL: 32 mg/dL (ref 0.0–40.0)

## 2021-12-05 LAB — TSH: TSH: 2.47 u[IU]/mL (ref 0.35–5.50)

## 2021-12-05 LAB — CBC WITH DIFFERENTIAL/PLATELET
Basophils Absolute: 0.1 10*3/uL (ref 0.0–0.1)
Basophils Relative: 0.8 % (ref 0.0–3.0)
Eosinophils Absolute: 0.2 10*3/uL (ref 0.0–0.7)
Eosinophils Relative: 2.8 % (ref 0.0–5.0)
HCT: 41.2 % (ref 36.0–46.0)
Hemoglobin: 13.6 g/dL (ref 12.0–15.0)
Lymphocytes Relative: 24.8 % (ref 12.0–46.0)
Lymphs Abs: 1.8 10*3/uL (ref 0.7–4.0)
MCHC: 33.1 g/dL (ref 30.0–36.0)
MCV: 88.4 fl (ref 78.0–100.0)
Monocytes Absolute: 0.6 10*3/uL (ref 0.1–1.0)
Monocytes Relative: 7.4 % (ref 3.0–12.0)
Neutro Abs: 4.8 10*3/uL (ref 1.4–7.7)
Neutrophils Relative %: 64.2 % (ref 43.0–77.0)
Platelets: 543 10*3/uL — ABNORMAL HIGH (ref 150.0–400.0)
RBC: 4.66 Mil/uL (ref 3.87–5.11)
RDW: 15.7 % — ABNORMAL HIGH (ref 11.5–15.5)
WBC: 7.5 10*3/uL (ref 4.0–10.5)

## 2021-12-05 LAB — HEPATIC FUNCTION PANEL
ALT: 15 U/L (ref 0–35)
AST: 18 U/L (ref 0–37)
Albumin: 4.2 g/dL (ref 3.5–5.2)
Alkaline Phosphatase: 61 U/L (ref 39–117)
Bilirubin, Direct: 0.1 mg/dL (ref 0.0–0.3)
Total Bilirubin: 0.6 mg/dL (ref 0.2–1.2)
Total Protein: 7.2 g/dL (ref 6.0–8.3)

## 2021-12-05 LAB — HEMOGLOBIN A1C: Hgb A1c MFr Bld: 6.1 % (ref 4.6–6.5)

## 2021-12-07 ENCOUNTER — Encounter: Payer: Self-pay | Admitting: Internal Medicine

## 2021-12-07 ENCOUNTER — Other Ambulatory Visit: Payer: Self-pay | Admitting: Internal Medicine

## 2021-12-07 ENCOUNTER — Ambulatory Visit (INDEPENDENT_AMBULATORY_CARE_PROVIDER_SITE_OTHER): Payer: Medicare HMO | Admitting: Internal Medicine

## 2021-12-07 VITALS — BP 128/82 | HR 56 | Temp 97.9°F | Resp 13 | Ht 61.0 in | Wt 163.4 lb

## 2021-12-07 DIAGNOSIS — Z0000 Encounter for general adult medical examination without abnormal findings: Secondary | ICD-10-CM | POA: Diagnosis not present

## 2021-12-07 DIAGNOSIS — R7303 Prediabetes: Secondary | ICD-10-CM

## 2021-12-07 DIAGNOSIS — Z1231 Encounter for screening mammogram for malignant neoplasm of breast: Secondary | ICD-10-CM | POA: Diagnosis not present

## 2021-12-07 DIAGNOSIS — E2839 Other primary ovarian failure: Secondary | ICD-10-CM | POA: Diagnosis not present

## 2021-12-07 DIAGNOSIS — I10 Essential (primary) hypertension: Secondary | ICD-10-CM | POA: Diagnosis not present

## 2021-12-07 DIAGNOSIS — R82998 Other abnormal findings in urine: Secondary | ICD-10-CM

## 2021-12-07 DIAGNOSIS — Z800 Family history of malignant neoplasm of digestive organs: Secondary | ICD-10-CM

## 2021-12-07 DIAGNOSIS — D473 Essential (hemorrhagic) thrombocythemia: Secondary | ICD-10-CM | POA: Diagnosis not present

## 2021-12-07 DIAGNOSIS — E785 Hyperlipidemia, unspecified: Secondary | ICD-10-CM | POA: Diagnosis not present

## 2021-12-07 DIAGNOSIS — R739 Hyperglycemia, unspecified: Secondary | ICD-10-CM | POA: Diagnosis not present

## 2021-12-07 NOTE — Progress Notes (Signed)
[name], female   DOB: January 06, 1946, 76 y.o.   MRN: [id number]   Subjective:    [name], female    DOB: [birth year], 76 y.o.   MRN: [id number]   Patient here for her physical exam. .  Chief Complaint  Patient presents with   Follow-up    Yearly CPE   .   HPI She reports she is doing relatively well.  Tries to stay active.  Walks her dog.  Discussed weight.  Discussed diet and exercise.  No chest pain or sob reported.  No abdominal pain or bowel change reported.  Discussed mammogram and bone density.  She will schedule.     Past Medical History:  Diagnosis Date   Blood in the stool    10 yrs ago   COVID-19 07/01/2021   Runny nose 12/15. (+) test 12/17.  Symptom resolution 07/02/21.   Diverticulitis    GERD (gastroesophageal reflux disease)    History of abnormal Pap smear    History of chicken pox    History of colon polyps    History of kidney stones    h/o    Hypercholesterolemia    Hypertension    Lumbosacral spondylolysis    Seronegative rheumatoid arthritis [facility])    Past Surgical History:  Procedure Laterality Date   CATARACT EXTRACTION W/PHACO Right 07/12/2021   Procedure: CATARACT EXTRACTION PHACO AND INTRAOCULAR LENS PLACEMENT ([place]) RIGHT TORIC LENS;  Surgeon: Leandrew Koyanagi, MD;  Location: [place];  Service: Ophthalmology;  Laterality: Right;  7.70 1:08.7   CATARACT EXTRACTION W/PHACO Left 07/26/2021   Procedure: CATARACT EXTRACTION PHACO AND INTRAOCULAR LENS PLACEMENT ([place]) LEFT TORIC LENS 6.24 01:02.2;  Surgeon: Leandrew Koyanagi, MD;  Location: [place];  Service: Ophthalmology;  Laterality: Left;   COLONOSCOPY     DILATION AND CURETTAGE OF UTERUS     KNEE ARTHROPLASTY Right 05/13/2019   Procedure: COMPUTER ASSISTED TOTAL KNEE ARTHROPLASTY RIGHT;  Surgeon: Dereck Leep, MD;  Location: [facility];  Service: Orthopedics;  Laterality: Right;   LEG / ANKLE SOFT TISSUE BIOPSY  2011   to confirm  arthritis   Family History  Problem Relation Age of Onset   Arthritis Father    Hyperlipidemia Father    Heart disease Father    Hypertension Father    Diabetes Father    Pulmonary fibrosis Father    Arthritis Mother    Heart disease Mother    Hyperlipidemia Mother    Hypertension Mother    Colon cancer Maternal Aunt    Colon cancer Maternal Uncle    Breast cancer Neg Hx    Social History   Socioeconomic History   Marital status: Divorced    Spouse name: Not on file   Number of children: Not on file   Years of education: Not on file   Highest education level: Not on file  Occupational History   Not on file  Tobacco Use   Smoking status: Never   Smokeless tobacco: Never  Vaping Use   Vaping Use: Never used  Substance and Sexual Activity   Alcohol use: Yes    Alcohol/week: 0.0 standard drinks    Comment: rare   Drug use: No   Sexual activity: Not on file  Other Topics Concern   Not on file  Social History Narrative   Not on file   Social Determinants of Health   Financial Resource Strain: Low Risk    Difficulty of Paying Living  Expenses: Not hard at all  Food Insecurity: No Food Insecurity   Worried About Charity fundraiser in the Last Year: Never true   Ran Out of Food in the Last Year: Never true  Transportation Needs: No Transportation Needs   Lack of Transportation (Medical): No   Lack of Transportation (Non-Medical): No  Physical Activity: Sufficiently Active   Days of Exercise per Week: 5 days   Minutes of Exercise per Session: 30 min  Stress: No Stress Concern Present   Feeling of Stress : Not at all  Social Connections: Unknown   Frequency of Communication with Friends and Family: More than three times a week   Frequency of Social Gatherings with Friends and Family: More than three times a week   Attends Religious Services: Not on Electrical engineer or Organizations: Not on file   Attends Archivist Meetings: Not on file    Marital Status: Not on file     Review of Systems  Constitutional:  Negative for fatigue and unexpected weight change.  HENT:  Negative for congestion, sinus pressure and sore throat.   Eyes:  Negative for pain and visual disturbance.  Respiratory:  Negative for cough, chest tightness and shortness of breath.   Cardiovascular:  Negative for chest pain, palpitations and leg swelling.  Gastrointestinal:  Negative for abdominal pain, diarrhea, nausea and vomiting.  Genitourinary:  Negative for difficulty urinating and dysuria.  Musculoskeletal:  Negative for joint swelling and myalgias.  Skin:  Negative for color change and rash.  Neurological:  Negative for dizziness, light-headedness and headaches.  Hematological:  Negative for adenopathy. Does not bruise/bleed easily.  Psychiatric/Behavioral:  Negative for agitation and dysphoric mood.       Objective:     BP 128/82 (BP Location: Left Arm, Patient Position: Sitting, Cuff Size: Small)   Pulse (!) 56   Temp 97.9 F (36.6 C) (Temporal)   Resp 13   Ht _0  (1.549 m)   Wt 163 lb 6.4 oz (74.1 kg)   LMP 07/16/1996   SpO2 99%   BMI 30.87 kg/m  Wt Readings from Last 3 Encounters:  12/07/21 163 lb 6.4 oz (74.1 kg)  08/10/21 172 lb (78 kg)  07/26/21 172 lb (78 kg)    Physical Exam Vitals reviewed.  Constitutional:      General: She is not in acute distress.    Appearance: Normal appearance. She is well-developed.  HENT:     Head: Normocephalic and atraumatic.     Right Ear: External ear normal.     Left Ear: External ear normal.  Eyes:     General: No scleral icterus.       Right eye: No discharge.        Left eye: No discharge.     Conjunctiva/sclera: Conjunctivae normal.  Neck:     Thyroid: No thyromegaly.  Cardiovascular:     Rate and Rhythm: Normal rate and regular rhythm.  Pulmonary:     Effort: No tachypnea, accessory muscle usage or respiratory distress.     Breath sounds: Normal breath sounds. No decreased  breath sounds or wheezing.  Chest:  Breasts:    Right: No inverted nipple, mass, nipple discharge or tenderness (no axillary adenopathy).     Left: No inverted nipple, mass, nipple discharge or tenderness (no axilarry adenopathy).  Abdominal:     General: Bowel sounds are normal.     Palpations: Abdomen is soft.     Tenderness:  There is no abdominal tenderness.  Musculoskeletal:        General: No swelling or tenderness.     Cervical back: Neck supple.  Lymphadenopathy:     Cervical: No cervical adenopathy.  Skin:    Findings: No erythema or rash.  Neurological:     Mental Status: She is alert and oriented to person, place, and time.  Psychiatric:        Mood and Affect: Mood normal.        Behavior: Behavior normal.     Outpatient Encounter Medications as of 12/07/2021  Medication Sig   aspirin EC 81 MG tablet Take 81 mg by mouth daily.   calcium carbonate (TUMS EX) 750 MG chewable tablet Chew 1 tablet by mouth daily.   diphenhydramine-acetaminophen (TYLENOL PM) 25-500 MG TABS tablet Take 1 tablet by mouth at bedtime.   lisinopril (ZESTRIL) 10 MG tablet TAKE 1 TABLET DAILY   meloxicam (MOBIC) 15 MG tablet Take one tablet qod   Multiple Vitamin (MULTIVITAMIN WITH MINERALS) TABS tablet Take 1 tablet by mouth daily.   Omega-3 Fatty Acids (FISH OIL) 1000 MG CAPS Take 1 capsule by mouth daily.   pantoprazole (PROTONIX) 40 MG tablet Take 1 tablet (40 mg total) by mouth daily.   Probiotic Product (PROBIOTIC PO) Take 1 capsule by mouth daily.   simvastatin (ZOCOR) 20 MG tablet TAKE 1 TABLET EVERY EVENING   traMADol (ULTRAM) 50 MG tablet 1-2 po bid prn   vitamin E 180 MG (400 UNITS) capsule Take 400 Units by mouth daily.   [DISCONTINUED] metFORMIN (GLUCOPHAGE-XR) 500 MG 24 hr tablet Take 1 tablet (500 mg total) by mouth 2 (two) times daily with a meal.   No facility-administered encounter medications on file as of 12/07/2021.     Lab Results  Component Value Date   WBC 7.5  12/05/2021   HGB 13.6 12/05/2021   HCT 41.2 12/05/2021   PLT 543.0 (H) 12/05/2021   GLUCOSE 102 (H) 12/05/2021   CHOL 163 12/05/2021   TRIG 160.0 (H) 12/05/2021   HDL 41.20 12/05/2021   LDLDIRECT 153.7 11/15/2010   LDLCALC 90 12/05/2021   ALT 15 12/05/2021   AST 18 12/05/2021   NA 140 12/05/2021   K 4.2 12/05/2021   CL 102 12/05/2021   CREATININE 0.78 12/05/2021   BUN 19 12/05/2021   CO2 29 12/05/2021   TSH 2.47 12/05/2021   INR 0.9 07/15/2019   HGBA1C 6.1 12/05/2021       Assessment & Plan:   Problem List Items Addressed This Visit     Breast cancer screening    Mammogram order placed.  Per CMA, pt will schedule.        Relevant Orders   MM 3D SCREEN BREAST BILATERAL   Calcium oxalate crystals in urine    Had KUB previously -A 19 mm peripherally calcified density in the right abdomen to the right of L2. This may represent a gallstone or renal calculus. Gallstone is favored. No other abnormal soft tissue calcification or stones. Had discussed CT abd/pelvis.  Wanted to monitor.  Follow.        Essential thrombocytosis (HCC)    Minimal elevation.  Has seen hematology.  Recommended continued f/u.  Follow cbc to confirm stable.        Relevant Orders   CBC with Differential/Platelet   Family history of colon cancer    Saw GI (Dr Alice Reichert).  Had recent colonoscopy.  Obtain results.  Health care maintenance    Physical today 12/07/21.  Mammogram 01/04/21 - Birads I. Colonoscopy recently as outlined.  Follow.        Hyperglycemia    Low carb diet and exercise.  Follow met b and a1c.         Hyperlipidemia    Continue simvastatin.  Low cholesterol diet and exercise.  Follow lipid panel and liver function tests.        Hypertension    Continue lisinopril.  Blood pressure as outlined.  No changes.  Follow metabolic panel.        Other Visit Diagnoses     Routine general medical examination at a health care facility    -  Primary   Estrogen  deficiency       Relevant Orders   DG Bone Density        Einar Pheasant, MD

## 2021-12-07 NOTE — Assessment & Plan Note (Addendum)
Physical today 12/07/21.  Mammogram 01/04/21 - Birads I. Colonoscopy recently as outlined.  Follow.

## 2021-12-17 ENCOUNTER — Encounter: Payer: Self-pay | Admitting: Internal Medicine

## 2021-12-17 NOTE — Assessment & Plan Note (Addendum)
Continue lisinopril.  Blood pressure as outlined.  No changes.  Follow metabolic panel.  

## 2021-12-17 NOTE — Assessment & Plan Note (Signed)
Saw GI (Dr Alice Reichert).  Had recent colonoscopy.  Obtain results.

## 2021-12-17 NOTE — Assessment & Plan Note (Signed)
Continue simvastatin.  Low cholesterol diet and exercise.  Follow lipid panel and liver function tests.   

## 2021-12-17 NOTE — Assessment & Plan Note (Signed)
Mammogram order placed.  Per CMA, pt will schedule.

## 2021-12-17 NOTE — Assessment & Plan Note (Signed)
Low carb diet and exercise.  Follow met b and a1c.   

## 2021-12-17 NOTE — Assessment & Plan Note (Signed)
Minimal elevation.  Has seen hematology.  Recommended continued f/u.  Follow cbc to confirm stable.  

## 2021-12-17 NOTE — Assessment & Plan Note (Signed)
Had KUB previously -A 19 mm peripherally calcified density in the right abdomen to the right of L2. This may represent a gallstone or renal calculus. Gallstone is favored. No other abnormal soft tissue calcification or stones. Had discussed CT abd/pelvis.  Wanted to monitor.  Follow.

## 2022-02-03 ENCOUNTER — Other Ambulatory Visit: Payer: Self-pay | Admitting: Family

## 2022-02-03 ENCOUNTER — Other Ambulatory Visit: Payer: Self-pay | Admitting: Internal Medicine

## 2022-02-06 ENCOUNTER — Ambulatory Visit
Admission: RE | Admit: 2022-02-06 | Discharge: 2022-02-06 | Disposition: A | Discharge status: Home / Self Care | Payer: Medicare HMO | Source: Ambulatory Visit | Attending: Internal Medicine | Admitting: Internal Medicine

## 2022-02-06 DIAGNOSIS — E2839 Other primary ovarian failure: Secondary | ICD-10-CM | POA: Diagnosis not present

## 2022-02-06 DIAGNOSIS — Z780 Asymptomatic menopausal state: Secondary | ICD-10-CM | POA: Diagnosis not present

## 2022-02-06 DIAGNOSIS — M85832 Other specified disorders of bone density and structure, left forearm: Secondary | ICD-10-CM | POA: Diagnosis not present

## 2022-02-06 DIAGNOSIS — Z1231 Encounter for screening mammogram for malignant neoplasm of breast: Secondary | ICD-10-CM | POA: Diagnosis not present

## 2022-02-12 ENCOUNTER — Other Ambulatory Visit: Payer: Self-pay

## 2022-02-12 ENCOUNTER — Encounter: Payer: Self-pay | Admitting: Internal Medicine

## 2022-02-12 MED ORDER — SIMVASTATIN 20 MG PO TABS: 20.0000 mg | ORAL_TABLET | Freq: Every evening | ORAL | 3 refills | Status: DC

## 2022-02-17 ENCOUNTER — Other Ambulatory Visit: Payer: Self-pay | Admitting: Internal Medicine

## 2022-02-20 DIAGNOSIS — Z961 Presence of intraocular lens: Secondary | ICD-10-CM | POA: Diagnosis not present

## 2022-03-08 ENCOUNTER — Telehealth: Payer: Self-pay | Admitting: *Deleted

## 2022-03-08 NOTE — Patient Outreach (Signed)
  Care Coordination   Initial Visit Note   03/08/2022 Name: Caitlin Bennett MRN: [id number] DOB: May 11, 1946  Caitlin Bennett is a 76 y.o. year old female who sees Einar Pheasant, MD for primary care. I spoke with  Leilani Able by phone today  RN discussed services [facility] services, RN, SW, and Pharmacist. Patient declined services.    Goals Addressed   None     SDOH assessments and interventions completed:  No     Care Coordination Interventions Activated:  No  Care Coordination Interventions:  No, not indicated   Follow up plan: No further intervention required.   Encounter Outcome:  Pt. Visit Completed  [place] [facility] [phone]

## 2022-04-05 ENCOUNTER — Other Ambulatory Visit (INDEPENDENT_AMBULATORY_CARE_PROVIDER_SITE_OTHER): Payer: Medicare HMO

## 2022-04-05 DIAGNOSIS — D473 Essential (hemorrhagic) thrombocythemia: Secondary | ICD-10-CM | POA: Diagnosis not present

## 2022-04-05 LAB — CBC WITH DIFFERENTIAL/PLATELET
Basophils Absolute: 0.1 10*3/uL (ref 0.0–0.1)
Basophils Relative: 1 % (ref 0.0–3.0)
Eosinophils Absolute: 0.3 10*3/uL (ref 0.0–0.7)
Eosinophils Relative: 4.4 % (ref 0.0–5.0)
HCT: 39 % (ref 36.0–46.0)
Hemoglobin: 13.1 g/dL (ref 12.0–15.0)
Lymphocytes Relative: 28.8 % (ref 12.0–46.0)
Lymphs Abs: 2 10*3/uL (ref 0.7–4.0)
MCHC: 33.7 g/dL (ref 30.0–36.0)
MCV: 89.3 fl (ref 78.0–100.0)
Monocytes Absolute: 0.6 10*3/uL (ref 0.1–1.0)
Monocytes Relative: 8.5 % (ref 3.0–12.0)
Neutro Abs: 4.1 10*3/uL (ref 1.4–7.7)
Neutrophils Relative %: 57.3 % (ref 43.0–77.0)
Platelets: 529 10*3/uL — ABNORMAL HIGH (ref 150.0–400.0)
RBC: 4.37 Mil/uL (ref 3.87–5.11)
RDW: 14.1 % (ref 11.5–15.5)
WBC: 7.1 10*3/uL (ref 4.0–10.5)

## 2022-04-09 ENCOUNTER — Telehealth: Payer: Self-pay

## 2022-04-09 NOTE — Telephone Encounter (Signed)
Patient returned office phone call and note was read to patient.

## 2022-04-09 NOTE — Telephone Encounter (Signed)
Lvm for pt to return call in regards to lab results.  Per Dr.Scott: Please notify Caitlin Bennett that the only lab run was her cbc.  I would like to check her cholesterol and metb/liver.  We can schedule at her appt or (her appt is at 1:00) - if she wants to eat a small breakfast early the morning of her appt and [place] draw at her appt - we can do that.  Notify platelet count is stable.  Still elevated, but stable.

## 2022-04-09 NOTE — Telephone Encounter (Signed)
Noted  

## 2022-04-10 ENCOUNTER — Encounter: Payer: Self-pay | Admitting: Internal Medicine

## 2022-04-10 ENCOUNTER — Ambulatory Visit (INDEPENDENT_AMBULATORY_CARE_PROVIDER_SITE_OTHER): Payer: Medicare HMO | Admitting: Internal Medicine

## 2022-04-10 VITALS — BP 114/82 | HR 84 | Temp 97.7°F | Ht 61.0 in | Wt 162.8 lb

## 2022-04-10 DIAGNOSIS — E785 Hyperlipidemia, unspecified: Secondary | ICD-10-CM

## 2022-04-10 DIAGNOSIS — Z23 Encounter for immunization: Secondary | ICD-10-CM

## 2022-04-10 DIAGNOSIS — D473 Essential (hemorrhagic) thrombocythemia: Secondary | ICD-10-CM

## 2022-04-10 DIAGNOSIS — Z86010 Personal history of colonic polyps: Secondary | ICD-10-CM

## 2022-04-10 DIAGNOSIS — R82998 Other abnormal findings in urine: Secondary | ICD-10-CM | POA: Diagnosis not present

## 2022-04-10 DIAGNOSIS — I10 Essential (primary) hypertension: Secondary | ICD-10-CM | POA: Diagnosis not present

## 2022-04-10 DIAGNOSIS — R739 Hyperglycemia, unspecified: Secondary | ICD-10-CM

## 2022-04-10 LAB — BASIC METABOLIC PANEL
BUN: 30 mg/dL — ABNORMAL HIGH (ref 6–23)
CO2: 25 mEq/L (ref 19–32)
Calcium: 9.5 mg/dL (ref 8.4–10.5)
Chloride: 106 mEq/L (ref 96–112)
Creatinine, Ser: 0.96 mg/dL (ref 0.40–1.20)
GFR: 57.04 mL/min — ABNORMAL LOW (ref >60.00)
Glucose, Bld: 92 mg/dL (ref 70–99)
Potassium: 4.4 mEq/L (ref 3.5–5.1)
Sodium: 138 mEq/L (ref 135–145)

## 2022-04-10 LAB — HEPATIC FUNCTION PANEL
ALT: 13 U/L (ref 0–35)
AST: 18 U/L (ref 0–37)
Albumin: 4.2 g/dL (ref 3.5–5.2)
Alkaline Phosphatase: 59 U/L (ref 39–117)
Bilirubin, Direct: 0.1 mg/dL (ref 0.0–0.3)
Total Bilirubin: 0.4 mg/dL (ref 0.2–1.2)
Total Protein: 7.5 g/dL (ref 6.0–8.3)

## 2022-04-10 LAB — LDL CHOLESTEROL, DIRECT: Direct LDL: 92 mg/dL

## 2022-04-10 LAB — LIPID PANEL
Cholesterol: 160 mg/dL (ref 0–200)
HDL: 32.1 mg/dL — ABNORMAL LOW (ref >39.00)
NonHDL: 128.37
Total CHOL/HDL Ratio: 5
Triglycerides: 324 mg/dL — ABNORMAL HIGH (ref 0.0–149.0)
VLDL: 64.8 mg/dL — ABNORMAL HIGH (ref 0.0–40.0)

## 2022-04-10 LAB — HEMOGLOBIN A1C: Hgb A1c MFr Bld: 6.2 % (ref 4.6–6.5)

## 2022-04-10 MED ORDER — HYDROXYZINE HCL 10 MG PO TABS: 10.0000 mg | ORAL_TABLET | Freq: Every evening | ORAL | 0 refills | Status: DC | PRN

## 2022-04-10 NOTE — Progress Notes (Signed)
Patient ID: EMALINE KARNES, female   DOB: [birth year], 76 y.o.   MRN: [id number]   Subjective:    Patient ID: Leilani Able, female    DOB: Feb 28, 1946, 76 y.o.   MRN: [id number]   Patient here for  Chief Complaint  Patient presents with   Follow-up    4 month follow up   .   HPI Here to follow up regarding her blood pressure, blood sugar and cholesterol.  She reports she is doing relatively well.  Is still walking.  No chest pain or sob reported.  No cough or congestion.  Has noticed some intermittent abdominal pain.  No vomiting.  No bowel change reported.     Past Medical History:  Diagnosis Date   Blood in the stool    10 yrs ago   COVID-19 07/01/2021   Runny nose 12/15. (+) test 12/17.  Symptom resolution 07/02/21.   Diverticulitis    GERD (gastroesophageal reflux disease)    History of abnormal Pap smear    History of chicken pox    History of colon polyps    History of kidney stones    h/o    Hypercholesterolemia    Hypertension    Lumbosacral spondylolysis    Seronegative rheumatoid arthritis [facility])    Past Surgical History:  Procedure Laterality Date   CATARACT EXTRACTION W/PHACO Right 07/12/2021   Procedure: CATARACT EXTRACTION PHACO AND INTRAOCULAR LENS PLACEMENT ([place]) RIGHT TORIC LENS;  Surgeon: Leandrew Koyanagi, MD;  Location: [place];  Service: Ophthalmology;  Laterality: Right;  7.70 1:08.7   CATARACT EXTRACTION W/PHACO Left 07/26/2021   Procedure: CATARACT EXTRACTION PHACO AND INTRAOCULAR LENS PLACEMENT ([place]) LEFT TORIC LENS 6.24 01:02.2;  Surgeon: Leandrew Koyanagi, MD;  Location: [place];  Service: Ophthalmology;  Laterality: Left;   COLONOSCOPY     DILATION AND CURETTAGE OF UTERUS     KNEE ARTHROPLASTY Right 05/13/2019   Procedure: COMPUTER ASSISTED TOTAL KNEE ARTHROPLASTY RIGHT;  Surgeon: Dereck Leep, MD;  Location: [facility];  Service: Orthopedics;  Laterality: Right;   LEG / ANKLE SOFT TISSUE BIOPSY  2011   to confirm  arthritis   Family History  Problem Relation Age of Onset   Arthritis Father    Hyperlipidemia Father    Heart disease Father    Hypertension Father    Diabetes Father    Pulmonary fibrosis Father    Arthritis Mother    Heart disease Mother    Hyperlipidemia Mother    Hypertension Mother    Colon cancer Maternal Aunt    Colon cancer Maternal Uncle    Breast cancer Neg Hx    Social History   Socioeconomic History   Marital status: Divorced    Spouse name: Not on file   Number of children: Not on file   Years of education: Not on file   Highest education level: Not on file  Occupational History   Not on file  Tobacco Use   Smoking status: Never   Smokeless tobacco: Never  Vaping Use   Vaping Use: Never used  Substance and Sexual Activity   Alcohol use: Yes    Alcohol/week: 0.0 standard drinks of alcohol    Comment: rare   Drug use: No   Sexual activity: Not on file  Other Topics Concern   Not on file  Social History Narrative   Not on file   Social Determinants of Health   Financial Resource Strain: Low Risk  (09/06/2021)   Overall  Financial Resource Strain (CARDIA)    Difficulty of Paying Living Expenses: Not hard at all  Food Insecurity: No Food Insecurity (02/22/2021)   Hunger Vital Sign    Worried About Running Out of Food in the Last Year: Never true    Ran Out of Food in the Last Year: Never true  Transportation Needs: No Transportation Needs (02/22/2021)   PRAPARE - Hydrologist (Medical): No    Lack of Transportation (Non-Medical): No  Physical Activity: Sufficiently Active (02/22/2021)   Exercise Vital Sign    Days of Exercise per Week: 5 days    Minutes of Exercise per Session: 30 min  Stress: No Stress Concern Present (02/22/2021)   [place]    Feeling of Stress : Not at all  Social Connections: Unknown (02/22/2021)   Social Connection and Isolation Panel  [NHANES]    Frequency of Communication with Friends and Family: More than three times a week    Frequency of Social Gatherings with Friends and Family: More than three times a week    Attends Religious Services: Not on Advertising copywriter or Organizations: Not on file    Attends Archivist Meetings: Not on file    Marital Status: Not on file     Review of Systems  Constitutional:  Negative for appetite change and unexpected weight change.  HENT:  Negative for congestion and sinus pressure.   Respiratory:  Negative for cough, chest tightness and shortness of breath.   Cardiovascular:  Negative for chest pain, palpitations and leg swelling.  Gastrointestinal:  Positive for abdominal pain. Negative for diarrhea, nausea and vomiting.  Genitourinary:  Negative for difficulty urinating and dysuria.  Musculoskeletal:  Negative for joint swelling and myalgias.  Skin:  Negative for color change and rash.  Neurological:  Negative for dizziness, light-headedness and headaches.  Psychiatric/Behavioral:  Negative for agitation and dysphoric mood.        Objective:     BP 114/82 (BP Location: Left Arm, Patient Position: Sitting, Cuff Size: Normal)   Pulse 84   Temp 97.7 F (36.5 C) (Oral)   Ht _0  (1.549 m)   Wt 162 lb 12.8 oz (73.8 kg)   LMP 07/16/1996   SpO2 96%   BMI 30.76 kg/m  Wt Readings from Last 3 Encounters:  04/10/22 162 lb 12.8 oz (73.8 kg)  12/07/21 163 lb 6.4 oz (74.1 kg)  08/10/21 172 lb (78 kg)    Physical Exam Vitals reviewed.  Constitutional:      General: She is not in acute distress.    Appearance: Normal appearance.  HENT:     Head: Normocephalic and atraumatic.     Right Ear: External ear normal.     Left Ear: External ear normal.  Eyes:     General: No scleral icterus.       Right eye: No discharge.        Left eye: No discharge.     Conjunctiva/sclera: Conjunctivae normal.  Neck:     Thyroid: No thyromegaly.  Cardiovascular:      Rate and Rhythm: Normal rate and regular rhythm.  Pulmonary:     Effort: No respiratory distress.     Breath sounds: Normal breath sounds. No wheezing.  Abdominal:     General: Bowel sounds are normal.     Palpations: Abdomen is soft.  Musculoskeletal:  General: No swelling or tenderness.     Cervical back: Neck supple. No tenderness.  Lymphadenopathy:     Cervical: No cervical adenopathy.  Skin:    Findings: No erythema or rash.  Neurological:     Mental Status: She is alert.  Psychiatric:        Mood and Affect: Mood normal.        Behavior: Behavior normal.      Outpatient Encounter Medications as of 04/10/2022  Medication Sig   aspirin EC 81 MG tablet Take 81 mg by mouth daily.   calcium carbonate (TUMS EX) 750 MG chewable tablet Chew 1 tablet by mouth daily.   diphenhydramine-acetaminophen (TYLENOL PM) 25-500 MG TABS tablet Take 1 tablet by mouth at bedtime.   hydrOXYzine (ATARAX) 10 MG tablet Take 1 tablet (10 mg total) by mouth at bedtime as needed.   lisinopril (ZESTRIL) 10 MG tablet TAKE 1 TABLET DAILY   meloxicam (MOBIC) 15 MG tablet TAKE 1 TABLET EVERY OTHER  DAY   metFORMIN (GLUCOPHAGE-XR) 500 MG 24 hr tablet TAKE 1 TABLET BY MOUTH TWICE DAILY WITH A MEAL   Multiple Vitamin (MULTIVITAMIN WITH MINERALS) TABS tablet Take 1 tablet by mouth daily.   Omega-3 Fatty Acids (FISH OIL) 1000 MG CAPS Take 1 capsule by mouth daily.   pantoprazole (PROTONIX) 40 MG tablet TAKE 1 TABLET DAILY   Probiotic Product (PROBIOTIC PO) Take 1 capsule by mouth daily.   simvastatin (ZOCOR) 20 MG tablet Take 1 tablet (20 mg total) by mouth every evening.   traMADol (ULTRAM) 50 MG tablet 1-2 po bid prn   vitamin E 180 MG (400 UNITS) capsule Take 400 Units by mouth daily.   No facility-administered encounter medications on file as of 04/10/2022.     Lab Results  Component Value Date   WBC 7.1 04/05/2022   HGB 13.1 04/05/2022   HCT 39.0 04/05/2022   PLT 529.0 (H) 04/05/2022    GLUCOSE 92 04/10/2022   CHOL 160 04/10/2022   TRIG 324.0 (H) 04/10/2022   HDL 32.10 (L) 04/10/2022   LDLDIRECT 92.0 04/10/2022   LDLCALC 90 12/05/2021   ALT 13 04/10/2022   AST 18 04/10/2022   NA 138 04/10/2022   K 4.4 04/10/2022   CL 106 04/10/2022   CREATININE 0.96 04/10/2022   BUN 30 (H) 04/10/2022   CO2 25 04/10/2022   TSH 2.47 12/05/2021   INR 0.9 07/15/2019   HGBA1C 6.2 04/10/2022    MM 3D SCREEN BREAST BILATERAL  Result Date: 02/07/2022 CLINICAL DATA:  Screening. EXAM: DIGITAL SCREENING BILATERAL MAMMOGRAM WITH TOMOSYNTHESIS AND CAD TECHNIQUE: Bilateral screening digital craniocaudal and mediolateral oblique mammograms were obtained. Bilateral screening digital breast tomosynthesis was performed. The images were evaluated with computer-aided detection. COMPARISON:  Previous exam(s). ACR Breast Density Category b: There are scattered areas of fibroglandular density. FINDINGS: There are no findings suspicious for malignancy. IMPRESSION: No mammographic evidence of malignancy. A result letter of this screening mammogram will be mailed directly to the patient. RECOMMENDATION: Screening mammogram in one year. (Code:SM-B-01Y) BI-RADS CATEGORY  1: Negative. Electronically Signed   By: Fidela Salisbury M.D.   On: 02/07/2022 16:25   DG Bone Density  Result Date: 02/06/2022 EXAM: DUAL X-RAY ABSORPTIOMETRY (DXA) FOR BONE MINERAL DENSITY IMPRESSION: Dear Dr Nicki Reaper, Your patient BRUCHA AHLQUIST completed a FRAX assessment on 02/06/2022 using the [place] (analysis version: 14.10) manufactured by [employer]. The following summarizes the results of our evaluation. PATIENT BIOGRAPHICAL: Name: Zamya, [place]  Patient ID: [id number] Birth Date: [birth year] Height:    61.0 in. Gender:     [age]       Weight:    163.4 lbs. Ethnicity:  White                            Exam Date: 02/06/2022 FRAX* RESULTS:  (version: 3.5) 10-year Probability of Fracture1 Major Osteoporotic  Fracture2 Hip Fracture 10.2% 1.7% Population: [place] (Caucasian) Risk Factors: None Based on Femur (Right) Neck BMD 1 -The 10-year probability of fracture may be lower than reported if the patient has received treatment. 2 -Major Osteoporotic Fracture: Clinical Spine, Forearm, Hip or Shoulder *FRAX is a Materials engineer of the [employer] for Metabolic Bone Disease, a [place] (WHO) [facility]. ASSESSMENT: The probability of a major osteoporotic fracture is 10.2% within the next ten years. The probability of a hip fracture is 1.7% within the next ten years. . Your patient Beyza Bellino completed a BMD test on 02/06/2022 using the [place] (software version: 14.10) manufactured by [employer]. The following summarizes the results of our evaluation. Technologist: MTB PATIENT BIOGRAPHICAL: Name: Kashayla, [place]ge[place] Patient ID: [id number] Birth Date: 24-Feb-1946 Height: 61.0 in. Gender: Female Exam Date: 02/06/2022 Weight: 163.4 lbs. Indications: Caucasian, Postmenopausal, Right knee replacement Fractures: Treatments: Calcium, Vitamin D DENSITOMETRY RESULTS: Site         Region     Measured Date Measured Age WHO Classification Young Adult T-score BMD         %Change vs. Previous Significant Change (*) DualFemur Neck Right 02/06/2021 77.4 Normal -1.0 0.905 g/cm2 - - DualFemur Total Mean 02/06/2022 77.4 Normal -0.2 0.983 g/cm2 - - Left Forearm Radius 33% 02/06/2022 77.4 Osteopenia -1.6 0.735 g/cm2 - - ASSESSMENT: The BMD measured at Forearm Radius 33% is 0.735 g/cm2 with a T-score of -1.6. This patient is considered osteopenic according to [facility]) criteria. The scan quality is good. Lumbar spine was not utilized due to advanced degenerative changes. World Pharmacologist [facility]) criteria for post-menopausal, Caucasian Women: Normal:                   T-score at or above -1 SD Osteopenia/low bone mass: T-score between -1 and -2.5  SD Osteoporosis:             T-score at or below -2.5 SD RECOMMENDATIONS: 1. All patients should optimize calcium and vitamin D intake. 2. Consider FDA-approved medical therapies in postmenopausal women and men aged 25 years and older, based on the following: a. A hip or vertebral(clinical or morphometric) fracture b. T-score < -2.5 at the femoral neck or spine after appropriate evaluation to exclude secondary causes c. Low bone mass (T-score between -1.0 and -2.5 at the femoral neck or spine) and a 10-year probability of a hip fracture > 3% or a 10-year probability of a major osteoporosis-related fracture > 20% based on the [place]-adapted WHO algorithm 3. Clinician judgment and/or patient preferences may indicate treatment for people with 10-year fracture probabilities above or below these levels FOLLOW-UP: People with diagnosed cases of osteoporosis or at high risk for fracture should have regular bone mineral density tests. For patients eligible for Medicare, routine testing is allowed once every 2 years. The testing frequency can be increased to one year for patients who have rapidly progressing disease, those who  are receiving or discontinuing medical therapy to restore bone mass, or have additional risk factors. I have reviewed this report, and agree with the above findings. [facility] Electronically Signed   By: Claudie Revering M.D.   On: 02/06/2022 15:30       Assessment & Plan:   Problem List Items Addressed This Visit     Calcium oxalate crystals in urine    Had KUB previously -A 19 mm peripherally calcified density in the right abdomen to the right of L2. This may represent a gallstone or renal calculus. Gallstone is favored. No other abnormal soft tissue calcification or stones. Had previously discussed CT abd/pelvis.  Wanted to monitor.  With discomfort - CT.       Essential thrombocytosis (HCC)    Minimal elevation.  Has seen hematology.  Recommended continued f/u.  Follow cbc to  confirm stable.       History of colon polyps    Per review, saw Dr Alice Reichert.  Need report.       Hyperglycemia    Low carb diet and exercise.  Follow met b and a1c.        Relevant Orders   Hemoglobin A1c (Completed)   Hyperlipidemia    Continue simvastatin.  Low cholesterol diet and exercise.  Follow lipid panel and liver function tests.       Relevant Orders   Lipid panel (Completed)   Hepatic function panel (Completed)   Hypertension - Primary    Continue lisinopril.  Blood pressure as outlined.  No changes.  Follow metabolic panel.       Relevant Orders   Basic metabolic panel (Completed)   Other Visit Diagnoses     Need for immunization against influenza       Relevant Orders   Flu Vaccine QUAD High Dose(Fluad) (Completed)        Einar Pheasant, MD

## 2022-04-11 ENCOUNTER — Telehealth: Payer: Self-pay

## 2022-04-11 ENCOUNTER — Encounter: Payer: Self-pay | Admitting: Internal Medicine

## 2022-04-11 ENCOUNTER — Other Ambulatory Visit: Payer: Self-pay

## 2022-04-11 DIAGNOSIS — R944 Abnormal results of kidney function studies: Secondary | ICD-10-CM

## 2022-04-11 NOTE — Telephone Encounter (Signed)
Pt returning call

## 2022-04-11 NOTE — Telephone Encounter (Signed)
LMTCB for lab results.  

## 2022-04-11 NOTE — Telephone Encounter (Signed)
See result note.  

## 2022-04-15 ENCOUNTER — Encounter: Payer: Self-pay | Admitting: Internal Medicine

## 2022-04-15 NOTE — Assessment & Plan Note (Signed)
Continue simvastatin.  Low cholesterol diet and exercise.  Follow lipid panel and liver function tests.   

## 2022-04-15 NOTE — Assessment & Plan Note (Signed)
Continue lisinopril.  Blood pressure as outlined.  No changes.  Follow metabolic panel.  

## 2022-04-15 NOTE — Assessment & Plan Note (Signed)
Had KUB previously -A 19 mm peripherally calcified density in the right abdomen to the right of L2. This may represent a gallstone or renal calculus. Gallstone is favored. No other abnormal soft tissue calcification or stones. Had previously discussed CT abd/pelvis.  Wanted to monitor.  With discomfort - CT.

## 2022-04-15 NOTE — Assessment & Plan Note (Signed)
Minimal elevation.  Has seen hematology.  Recommended continued f/u.  Follow cbc to confirm stable.

## 2022-04-15 NOTE — Assessment & Plan Note (Addendum)
Per review, saw Dr Alice Reichert.  Need report.

## 2022-04-15 NOTE — Assessment & Plan Note (Signed)
Low carb diet and exercise.  Follow met b and a1c.   

## 2022-04-24 ENCOUNTER — Other Ambulatory Visit (INDEPENDENT_AMBULATORY_CARE_PROVIDER_SITE_OTHER): Payer: Medicare HMO

## 2022-04-24 DIAGNOSIS — R944 Abnormal results of kidney function studies: Secondary | ICD-10-CM

## 2022-04-24 LAB — BASIC METABOLIC PANEL
BUN: 28 mg/dL — ABNORMAL HIGH (ref 6–23)
CO2: 28 mEq/L (ref 19–32)
Calcium: 9.9 mg/dL (ref 8.4–10.5)
Chloride: 102 mEq/L (ref 96–112)
Creatinine, Ser: 1.07 mg/dL (ref 0.40–1.20)
GFR: 50.06 mL/min — ABNORMAL LOW (ref >60.00)
Glucose, Bld: 60 mg/dL — ABNORMAL LOW (ref 70–99)
Potassium: 4.5 mEq/L (ref 3.5–5.1)
Sodium: 138 mEq/L (ref 135–145)

## 2022-04-25 ENCOUNTER — Encounter: Payer: Self-pay | Admitting: Internal Medicine

## 2022-04-25 ENCOUNTER — Telehealth: Payer: Self-pay

## 2022-04-25 DIAGNOSIS — R944 Abnormal results of kidney function studies: Secondary | ICD-10-CM

## 2022-04-25 NOTE — Telephone Encounter (Signed)
I gave the patient her lab results. However, she stated she would like her results in writing. I informed the patient her results and the providers response is on her My chart. She stated fine if it is on her my chart.

## 2022-04-25 NOTE — Telephone Encounter (Signed)
I have responded to patient via mychart.

## 2022-04-25 NOTE — Telephone Encounter (Signed)
LMTCB for lab results.  

## 2022-04-27 ENCOUNTER — Telehealth: Payer: Self-pay | Admitting: Internal Medicine

## 2022-04-27 NOTE — Telephone Encounter (Signed)
Order placed for renal ultrasound.

## 2022-04-27 NOTE — Telephone Encounter (Signed)
Lft pt vm to call ofc . thanks 

## 2022-05-03 ENCOUNTER — Ambulatory Visit
Admission: RE | Admit: 2022-05-03 | Discharge: 2022-05-03 | Disposition: A | Discharge status: Home / Self Care | Payer: Medicare HMO | Source: Ambulatory Visit | Attending: Internal Medicine | Admitting: Internal Medicine

## 2022-05-03 DIAGNOSIS — R944 Abnormal results of kidney function studies: Secondary | ICD-10-CM | POA: Insufficient documentation

## 2022-05-03 DIAGNOSIS — N281 Cyst of kidney, acquired: Secondary | ICD-10-CM | POA: Diagnosis not present

## 2022-05-04 ENCOUNTER — Other Ambulatory Visit: Payer: Self-pay

## 2022-05-04 DIAGNOSIS — R944 Abnormal results of kidney function studies: Secondary | ICD-10-CM

## 2022-05-08 ENCOUNTER — Encounter: Payer: Self-pay | Admitting: Internal Medicine

## 2022-05-09 NOTE — Telephone Encounter (Signed)
See me before calling.  Please call her and see how much she was paying for the hydroxyzine.  This is a generic medication and is not a new medication.  Can ask pharmacy how much the medication cost if were paying out of pocket.

## 2022-05-17 ENCOUNTER — Other Ambulatory Visit (INDEPENDENT_AMBULATORY_CARE_PROVIDER_SITE_OTHER): Payer: Medicare HMO

## 2022-05-17 DIAGNOSIS — R944 Abnormal results of kidney function studies: Secondary | ICD-10-CM

## 2022-05-17 LAB — BASIC METABOLIC PANEL
BUN: 27 mg/dL — ABNORMAL HIGH (ref 6–23)
CO2: 28 mEq/L (ref 19–32)
Calcium: 10 mg/dL (ref 8.4–10.5)
Chloride: 103 mEq/L (ref 96–112)
Creatinine, Ser: 0.98 mg/dL (ref 0.40–1.20)
GFR: 55.6 mL/min — ABNORMAL LOW (ref >60.00)
Glucose, Bld: 104 mg/dL — ABNORMAL HIGH (ref 70–99)
Potassium: 4.5 mEq/L (ref 3.5–5.1)
Sodium: 139 mEq/L (ref 135–145)

## 2022-05-18 ENCOUNTER — Other Ambulatory Visit: Payer: Medicare HMO

## 2022-05-24 DIAGNOSIS — Z79899 Other long term (current) drug therapy: Secondary | ICD-10-CM | POA: Diagnosis not present

## 2022-05-24 DIAGNOSIS — M47816 Spondylosis without myelopathy or radiculopathy, lumbar region: Secondary | ICD-10-CM | POA: Diagnosis not present

## 2022-05-24 DIAGNOSIS — M5136 Other intervertebral disc degeneration, lumbar region: Secondary | ICD-10-CM | POA: Diagnosis not present

## 2022-06-02 ENCOUNTER — Telehealth: Payer: Self-pay | Admitting: Internal Medicine

## 2022-06-02 DIAGNOSIS — R7303 Prediabetes: Secondary | ICD-10-CM

## 2022-06-22 ENCOUNTER — Other Ambulatory Visit: Payer: Self-pay

## 2022-06-22 DIAGNOSIS — I10 Essential (primary) hypertension: Secondary | ICD-10-CM

## 2022-06-22 DIAGNOSIS — E785 Hyperlipidemia, unspecified: Secondary | ICD-10-CM

## 2022-06-22 DIAGNOSIS — R739 Hyperglycemia, unspecified: Secondary | ICD-10-CM

## 2022-06-22 NOTE — Telephone Encounter (Signed)
Patient called and has an appointment on 07/20/2022. She would like to have labs done before she comes into the office. No lab orders.

## 2022-07-17 ENCOUNTER — Other Ambulatory Visit (INDEPENDENT_AMBULATORY_CARE_PROVIDER_SITE_OTHER): Payer: Medicare HMO

## 2022-07-17 DIAGNOSIS — I10 Essential (primary) hypertension: Secondary | ICD-10-CM

## 2022-07-17 DIAGNOSIS — R739 Hyperglycemia, unspecified: Secondary | ICD-10-CM | POA: Diagnosis not present

## 2022-07-17 DIAGNOSIS — E785 Hyperlipidemia, unspecified: Secondary | ICD-10-CM | POA: Diagnosis not present

## 2022-07-17 LAB — BASIC METABOLIC PANEL
BUN: 19 mg/dL (ref 6–23)
CO2: 27 mEq/L (ref 19–32)
Calcium: 9.7 mg/dL (ref 8.4–10.5)
Chloride: 104 mEq/L (ref 96–112)
Creatinine, Ser: 0.81 mg/dL (ref 0.40–1.20)
GFR: 69.81 mL/min (ref >60.00)
Glucose, Bld: 100 mg/dL — ABNORMAL HIGH (ref 70–99)
Potassium: 4.2 mEq/L (ref 3.5–5.1)
Sodium: 141 mEq/L (ref 135–145)

## 2022-07-17 LAB — LIPID PANEL
Cholesterol: 150 mg/dL (ref 0–200)
HDL: 35.2 mg/dL — ABNORMAL LOW (ref >39.00)
LDL Cholesterol: 81 mg/dL (ref 0–99)
NonHDL: 114.87
Total CHOL/HDL Ratio: 4
Triglycerides: 171 mg/dL — ABNORMAL HIGH (ref 0.0–149.0)
VLDL: 34.2 mg/dL (ref 0.0–40.0)

## 2022-07-17 LAB — CBC WITH DIFFERENTIAL/PLATELET
Basophils Absolute: 0 10*3/uL (ref 0.0–0.1)
Basophils Relative: 0.8 % (ref 0.0–3.0)
Eosinophils Absolute: 0.2 10*3/uL (ref 0.0–0.7)
Eosinophils Relative: 2.7 % (ref 0.0–5.0)
HCT: 37.7 % (ref 36.0–46.0)
Hemoglobin: 12.7 g/dL (ref 12.0–15.0)
Lymphocytes Relative: 31.3 % (ref 12.0–46.0)
Lymphs Abs: 1.9 10*3/uL (ref 0.7–4.0)
MCHC: 33.7 g/dL (ref 30.0–36.0)
MCV: 90.3 fl (ref 78.0–100.0)
Monocytes Absolute: 0.5 10*3/uL (ref 0.1–1.0)
Monocytes Relative: 7.5 % (ref 3.0–12.0)
Neutro Abs: 3.5 10*3/uL (ref 1.4–7.7)
Neutrophils Relative %: 57.7 % (ref 43.0–77.0)
Platelets: 579 10*3/uL — ABNORMAL HIGH (ref 150.0–400.0)
RBC: 4.17 Mil/uL (ref 3.87–5.11)
RDW: 14.2 % (ref 11.5–15.5)
WBC: 6.1 10*3/uL (ref 4.0–10.5)

## 2022-07-17 LAB — HEPATIC FUNCTION PANEL
ALT: 11 U/L (ref 0–35)
AST: 16 U/L (ref 0–37)
Albumin: 4.1 g/dL (ref 3.5–5.2)
Alkaline Phosphatase: 55 U/L (ref 39–117)
Bilirubin, Direct: 0.1 mg/dL (ref 0.0–0.3)
Total Bilirubin: 0.4 mg/dL (ref 0.2–1.2)
Total Protein: 6.9 g/dL (ref 6.0–8.3)

## 2022-07-17 LAB — HEMOGLOBIN A1C: Hgb A1c MFr Bld: 6.1 % (ref 4.6–6.5)

## 2022-07-20 ENCOUNTER — Ambulatory Visit (INDEPENDENT_AMBULATORY_CARE_PROVIDER_SITE_OTHER): Payer: Medicare HMO | Admitting: Internal Medicine

## 2022-07-20 ENCOUNTER — Encounter: Payer: Self-pay | Admitting: Internal Medicine

## 2022-07-20 VITALS — BP 128/78 | HR 104 | Temp 97.6°F | Resp 16 | Ht 61.0 in | Wt 150.8 lb

## 2022-07-20 DIAGNOSIS — I10 Essential (primary) hypertension: Secondary | ICD-10-CM | POA: Diagnosis not present

## 2022-07-20 DIAGNOSIS — E785 Hyperlipidemia, unspecified: Secondary | ICD-10-CM | POA: Diagnosis not present

## 2022-07-20 DIAGNOSIS — R739 Hyperglycemia, unspecified: Secondary | ICD-10-CM

## 2022-07-20 DIAGNOSIS — R7303 Prediabetes: Secondary | ICD-10-CM | POA: Diagnosis not present

## 2022-07-20 MED ORDER — LISINOPRIL 10 MG PO TABS: 10.0000 mg | ORAL_TABLET | Freq: Every day | ORAL | 1 refills | Status: DC

## 2022-07-20 NOTE — Progress Notes (Unsigned)
Subjective:    [name], female    DOB: 11-20-1945, 77 y.o.   MRN: [id number]  Patient here for  Chief Complaint  Patient presents with   Medical Management of Chronic Issues   Hypertension    HPI Reports she is doing relatively well.  Tries to stay active.  No chest pain or sob reported.  No abdominal pain or bowel change reported.  Discussed labs.  Discussed changing to crestor (instead of simvastatin).  Has adjusted diet.  Lost weight.  Doing well.    Past Medical History:  Diagnosis Date   Blood in the stool    10 yrs ago   COVID-19 07/01/2021   Runny nose 12/15. (+) test 12/17.  Symptom resolution 07/02/21.   Diverticulitis    GERD (gastroesophageal reflux disease)    History of abnormal Pap smear    History of chicken pox    History of colon polyps    History of kidney stones    h/o    Hypercholesterolemia    Hypertension    Lumbosacral spondylolysis    Seronegative rheumatoid arthritis [facility])    Past Surgical History:  Procedure Laterality Date   CATARACT EXTRACTION W/PHACO Right 07/12/2021   Procedure: CATARACT EXTRACTION PHACO AND INTRAOCULAR LENS PLACEMENT ([place]) RIGHT TORIC LENS;  Surgeon: Leandrew Koyanagi, MD;  Location: [place];  Service: Ophthalmology;  Laterality: Right;  7.70 1:08.7   CATARACT EXTRACTION W/PHACO Left 07/26/2021   Procedure: CATARACT EXTRACTION PHACO AND INTRAOCULAR LENS PLACEMENT ([place]) LEFT TORIC LENS 6.24 01:02.2;  Surgeon: Leandrew Koyanagi, MD;  Location: [place];  Service: Ophthalmology;  Laterality: Left;   COLONOSCOPY     DILATION AND CURETTAGE OF UTERUS     KNEE ARTHROPLASTY Right 05/13/2019   Procedure: COMPUTER ASSISTED TOTAL KNEE ARTHROPLASTY RIGHT;  Surgeon: Dereck Leep, MD;  Location: [facility];  Service: Orthopedics;  Laterality: Right;   LEG / ANKLE SOFT TISSUE BIOPSY  2011   to confirm arthritis   Family History  Problem Relation Age of Onset   Arthritis Father     Hyperlipidemia Father    Heart disease Father    Hypertension Father    Diabetes Father    Pulmonary fibrosis Father    Arthritis Mother    Heart disease Mother    Hyperlipidemia Mother    Hypertension Mother    Colon cancer Maternal Aunt    Colon cancer Maternal Uncle    Breast cancer Neg Hx    Social History   Socioeconomic History   Marital status: Divorced    Spouse name: Not on file   Number of children: Not on file   Years of education: Not on file   Highest education level: Not on file  Occupational History   Not on file  Tobacco Use   Smoking status: Never   Smokeless tobacco: Never  Vaping Use   Vaping Use: Never used  Substance and Sexual Activity   Alcohol use: Yes    Alcohol/week: 0.0 standard drinks of alcohol    Comment: rare   Drug use: No   Sexual activity: Not on file  Other Topics Concern   Not on file  Social History Narrative   Not on file   Social Determinants of Health   Financial Resource Strain: Low Risk  (09/06/2021)   Overall Financial Resource Strain (CARDIA)    Difficulty of Paying Living Expenses: Not hard at all  Food Insecurity: No Food Insecurity (02/22/2021)  Hunger Vital Sign    Worried About Running Out of Food in the Last Year: Never true    Ran Out of Food in the Last Year: Never true  Transportation Needs: No Transportation Needs (02/22/2021)   PRAPARE - Hydrologist (Medical): No    Lack of Transportation (Non-Medical): No  Physical Activity: Sufficiently Active (02/22/2021)   Exercise Vital Sign    Days of Exercise per Week: 5 days    Minutes of Exercise per Session: 30 min  Stress: No Stress Concern Present (02/22/2021)   [place]    Feeling of Stress : Not at all  Social Connections: Unknown (02/22/2021)   Social Connection and Isolation Panel [NHANES]    Frequency of Communication with Friends and Family: More than three  times a week    Frequency of Social Gatherings with Friends and Family: More than three times a week    Attends Religious Services: Not on Advertising copywriter or Organizations: Not on file    Attends Archivist Meetings: Not on file    Marital Status: Not on file     Review of Systems  Constitutional:  Negative for appetite change and unexpected weight change.  HENT:  Negative for congestion, sinus pressure and sore throat.   Eyes:  Negative for pain and visual disturbance.  Respiratory:  Negative for cough, chest tightness and shortness of breath.   Cardiovascular:  Negative for chest pain, palpitations and leg swelling.  Gastrointestinal:  Negative for abdominal pain, diarrhea, nausea and vomiting.  Genitourinary:  Negative for difficulty urinating and dysuria.  Musculoskeletal:  Negative for joint swelling and myalgias.  Skin:  Negative for color change and rash.  Neurological:  Negative for dizziness and headaches.  Hematological:  Negative for adenopathy. Does not bruise/bleed easily.  Psychiatric/Behavioral:  Negative for agitation and dysphoric mood.        Objective:     BP 128/78   Pulse (!) 104   Temp 97.6 F (36.4 C) (Temporal)   Resp 16   Ht '5\' 1"'$  (1.549 m)   Wt 150 lb 12.8 oz (68.4 kg)   LMP 07/16/1996   SpO2 99%   BMI 28.49 kg/m  Wt Readings from Last 3 Encounters:  07/20/22 150 lb 12.8 oz (68.4 kg)  04/10/22 162 lb 12.8 oz (73.8 kg)  12/07/21 163 lb 6.4 oz (74.1 kg)    Physical Exam Vitals reviewed.  Constitutional:      General: She is not in acute distress.    Appearance: Normal appearance.  HENT:     Head: Normocephalic and atraumatic.     Right Ear: External ear normal.     Left Ear: External ear normal.  Eyes:     General: No scleral icterus.       Right eye: No discharge.        Left eye: No discharge.     Conjunctiva/sclera: Conjunctivae normal.  Neck:     Thyroid: No thyromegaly.  Cardiovascular:     Rate and  Rhythm: Normal rate and regular rhythm.  Pulmonary:     Effort: No respiratory distress.     Breath sounds: Normal breath sounds. No wheezing.  Abdominal:     General: Bowel sounds are normal.     Palpations: Abdomen is soft.     Tenderness: There is no abdominal tenderness.  Musculoskeletal:  General: No swelling or tenderness.     Cervical back: Neck supple. No tenderness.  Lymphadenopathy:     Cervical: No cervical adenopathy.  Skin:    Findings: No erythema or rash.  Neurological:     Mental Status: She is alert.  Psychiatric:        Mood and Affect: Mood normal.        Behavior: Behavior normal.      Outpatient Encounter Medications as of 07/20/2022  Medication Sig   aspirin EC 81 MG tablet Take 81 mg by mouth daily.   calcium carbonate (TUMS EX) 750 MG chewable tablet Chew 1 tablet by mouth daily.   diphenhydramine-acetaminophen (TYLENOL PM) 25-500 MG TABS tablet Take 1 tablet by mouth at bedtime.   metFORMIN (GLUCOPHAGE-XR) 500 MG 24 hr tablet TAKE 1 TABLET BY MOUTH TWICE DAILY WITH A MEAL   Multiple Vitamin (MULTIVITAMIN WITH MINERALS) TABS tablet Take 1 tablet by mouth daily.   niacin 50 MG tablet Take 50 mg by mouth at bedtime.   Omega-3 Fatty Acids (FISH OIL) 1000 MG CAPS Take 1 capsule by mouth daily.   pantoprazole (PROTONIX) 40 MG tablet TAKE 1 TABLET DAILY   Probiotic Product (PROBIOTIC PO) Take 1 capsule by mouth daily.   simvastatin (ZOCOR) 20 MG tablet Take 1 tablet (20 mg total) by mouth every evening.   traMADol (ULTRAM) 50 MG tablet 1-2 po bid prn   vitamin E 180 MG (400 UNITS) capsule Take 400 Units by mouth daily.   [DISCONTINUED] lisinopril (ZESTRIL) 10 MG tablet TAKE 1 TABLET DAILY   [DISCONTINUED] meloxicam (MOBIC) 15 MG tablet TAKE 1 TABLET EVERY OTHER  DAY   lisinopril (ZESTRIL) 10 MG tablet Take 1 tablet (10 mg total) by mouth daily.   [DISCONTINUED] hydrOXYzine (ATARAX) 10 MG tablet Take 1 tablet (10 mg total) by mouth at bedtime as needed.    [DISCONTINUED] lisinopril (ZESTRIL) 10 MG tablet Take 1 tablet (10 mg total) by mouth daily.   No facility-administered encounter medications on file as of 07/20/2022.     Lab Results  Component Value Date   WBC 6.1 07/17/2022   HGB 12.7 07/17/2022   HCT 37.7 07/17/2022   PLT 579.0 Repeated and verified X2. (H) 07/17/2022   GLUCOSE 100 (H) 07/17/2022   CHOL 150 07/17/2022   TRIG 171.0 (H) 07/17/2022   HDL 35.20 (L) 07/17/2022   LDLDIRECT 92.0 04/10/2022   LDLCALC 81 07/17/2022   ALT 11 07/17/2022   AST 16 07/17/2022   NA 141 07/17/2022   K 4.2 07/17/2022   CL 104 07/17/2022   CREATININE 0.81 07/17/2022   BUN 19 07/17/2022   CO2 27 07/17/2022   TSH 2.47 12/05/2021   INR 0.9 07/15/2019   HGBA1C 6.1 07/17/2022    US Renal  Result Date: 05/03/2022 CLINICAL DATA:  Decreased GFR EXAM: RENAL / URINARY TRACT ULTRASOUND COMPLETE COMPARISON:  None Available. FINDINGS: Right Kidney: Renal measurements: 8.9 x 4.7 x 5 0 cm = volume: 111 mL. Echogenicity within normal limits. No mass or hydronephrosis visualized. Left Kidney: Renal measurements: 10.9 x 6 x 5.6 cm = volume: 194 mL. Contains a 12 mm cyst. No follow-up imaging recommended for the cyst. Bladder: Appears normal for degree of bladder distention. Other: None. IMPRESSION: No significant abnormalities. Electronically Signed   By: Dorise Bullion III M.D.   On: 05/03/2022 15:24       Assessment & Plan:  Primary hypertension Assessment & Plan: Continue lisinopril.  Blood pressure as outlined.  No changes.  Follow metabolic panel.   Orders: -     Basic metabolic panel; Future -     CBC with Differential/Platelet; Future  Prediabetes  Hyperglycemia Assessment & Plan: Low carb diet and exercise.  Follow met b and a1c.    Orders: -     Hemoglobin A1c; Future  Hyperlipidemia, unspecified hyperlipidemia type Assessment & Plan: On simvastatin.  Discussed labs.  Discussed changing to crestor '20mg'$  q day.  Will notify me when  needs new refill and will send in crestor '20mg'$  q day. Low cholesterol diet and exercise.  Follow lipid panel and liver function tests.   Orders: -     Lipid panel; Future -     Hepatic function panel; Future -     TSH; Future  Other orders -     Lisinopril; Take 1 tablet (10 mg total) by mouth daily.  Dispense: 90 tablet; Refill: 1     Einar Pheasant, MD

## 2022-07-22 ENCOUNTER — Encounter: Payer: Self-pay | Admitting: Internal Medicine

## 2022-07-22 NOTE — Assessment & Plan Note (Signed)
On simvastatin.  Discussed labs.  Discussed changing to crestor '20mg'$  q day.  Will notify me when needs new refill and will send in crestor '20mg'$  q day. Low cholesterol diet and exercise.  Follow lipid panel and liver function tests.

## 2022-07-22 NOTE — Assessment & Plan Note (Signed)
Low carb diet and exercise.  Follow met b and a1c.  

## 2022-07-22 NOTE — Assessment & Plan Note (Signed)
Continue lisinopril.  Blood pressure as outlined.  No changes.  Follow metabolic panel.

## 2022-08-03 ENCOUNTER — Other Ambulatory Visit: Payer: Self-pay | Admitting: Family

## 2022-08-06 ENCOUNTER — Encounter: Payer: Self-pay | Admitting: Internal Medicine

## 2022-08-07 ENCOUNTER — Other Ambulatory Visit: Payer: Self-pay

## 2022-08-07 MED ORDER — ROSUVASTATIN CALCIUM 20 MG PO TABS: 20.0000 mg | ORAL_TABLET | Freq: Every day | ORAL | 3 refills | Status: DC

## 2022-08-07 NOTE — Telephone Encounter (Signed)
Yes.  Please send in crestor '20mg'$  q day.  Will need liver panel checked 6 weeks after starting.

## 2022-08-25 ENCOUNTER — Other Ambulatory Visit: Payer: Self-pay | Admitting: Family

## 2022-09-12 ENCOUNTER — Other Ambulatory Visit: Payer: Self-pay

## 2022-09-12 ENCOUNTER — Encounter: Payer: Self-pay | Admitting: Internal Medicine

## 2022-09-12 MED ORDER — LISINOPRIL 10 MG PO TABS: 10.0000 mg | ORAL_TABLET | Freq: Every day | ORAL | 3 refills | Status: DC

## 2022-09-18 ENCOUNTER — Other Ambulatory Visit (INDEPENDENT_AMBULATORY_CARE_PROVIDER_SITE_OTHER): Payer: Medicare HMO

## 2022-09-18 DIAGNOSIS — R739 Hyperglycemia, unspecified: Secondary | ICD-10-CM

## 2022-09-18 DIAGNOSIS — I10 Essential (primary) hypertension: Secondary | ICD-10-CM

## 2022-09-18 DIAGNOSIS — E785 Hyperlipidemia, unspecified: Secondary | ICD-10-CM

## 2022-09-18 LAB — HEPATIC FUNCTION PANEL
ALT: 11 U/L (ref 0–35)
AST: 16 U/L (ref 0–37)
Albumin: 3.9 g/dL (ref 3.5–5.2)
Alkaline Phosphatase: 58 U/L (ref 39–117)
Bilirubin, Direct: 0.1 mg/dL (ref 0.0–0.3)
Total Bilirubin: 0.3 mg/dL (ref 0.2–1.2)
Total Protein: 6.8 g/dL (ref 6.0–8.3)

## 2022-10-09 DIAGNOSIS — L570 Actinic keratosis: Secondary | ICD-10-CM | POA: Diagnosis not present

## 2022-10-09 DIAGNOSIS — Z86018 Personal history of other benign neoplasm: Secondary | ICD-10-CM | POA: Diagnosis not present

## 2022-10-09 DIAGNOSIS — L298 Other pruritus: Secondary | ICD-10-CM | POA: Diagnosis not present

## 2022-10-09 DIAGNOSIS — L821 Other seborrheic keratosis: Secondary | ICD-10-CM | POA: Diagnosis not present

## 2022-10-09 DIAGNOSIS — Z872 Personal history of diseases of the skin and subcutaneous tissue: Secondary | ICD-10-CM | POA: Diagnosis not present

## 2022-10-09 DIAGNOSIS — L578 Other skin changes due to chronic exposure to nonionizing radiation: Secondary | ICD-10-CM | POA: Diagnosis not present

## 2022-11-07 ENCOUNTER — Other Ambulatory Visit: Payer: Self-pay | Admitting: Family

## 2022-11-15 ENCOUNTER — Other Ambulatory Visit (INDEPENDENT_AMBULATORY_CARE_PROVIDER_SITE_OTHER): Payer: Medicare HMO

## 2022-11-15 DIAGNOSIS — R739 Hyperglycemia, unspecified: Secondary | ICD-10-CM

## 2022-11-15 DIAGNOSIS — I10 Essential (primary) hypertension: Secondary | ICD-10-CM | POA: Diagnosis not present

## 2022-11-15 DIAGNOSIS — E785 Hyperlipidemia, unspecified: Secondary | ICD-10-CM

## 2022-11-15 LAB — CBC WITH DIFFERENTIAL/PLATELET
Basophils Absolute: 0.1 10*3/uL (ref 0.0–0.1)
Basophils Relative: 1.1 % (ref 0.0–3.0)
Eosinophils Absolute: 0.2 10*3/uL (ref 0.0–0.7)
Eosinophils Relative: 3.8 % (ref 0.0–5.0)
HCT: 38.9 % (ref 36.0–46.0)
Hemoglobin: 13.1 g/dL (ref 12.0–15.0)
Lymphocytes Relative: 31.5 % (ref 12.0–46.0)
Lymphs Abs: 1.9 10*3/uL (ref 0.7–4.0)
MCHC: 33.8 g/dL (ref 30.0–36.0)
MCV: 90.2 fl (ref 78.0–100.0)
Monocytes Absolute: 0.5 10*3/uL (ref 0.1–1.0)
Monocytes Relative: 8.7 % (ref 3.0–12.0)
Neutro Abs: 3.3 10*3/uL (ref 1.4–7.7)
Neutrophils Relative %: 54.9 % (ref 43.0–77.0)
Platelets: 530 10*3/uL — ABNORMAL HIGH (ref 150.0–400.0)
RBC: 4.32 Mil/uL (ref 3.87–5.11)
RDW: 13.7 % (ref 11.5–15.5)
WBC: 5.9 10*3/uL (ref 4.0–10.5)

## 2022-11-15 LAB — BASIC METABOLIC PANEL
BUN: 25 mg/dL — ABNORMAL HIGH (ref 6–23)
CO2: 26 mEq/L (ref 19–32)
Calcium: 9.8 mg/dL (ref 8.4–10.5)
Chloride: 104 mEq/L (ref 96–112)
Creatinine, Ser: 0.98 mg/dL (ref 0.40–1.20)
GFR: 55.41 mL/min — ABNORMAL LOW (ref >60.00)
Glucose, Bld: 101 mg/dL — ABNORMAL HIGH (ref 70–99)
Potassium: 4.5 mEq/L (ref 3.5–5.1)
Sodium: 139 mEq/L (ref 135–145)

## 2022-11-15 LAB — LIPID PANEL
Cholesterol: 131 mg/dL (ref 0–200)
HDL: 39.8 mg/dL (ref >39.00)
LDL Cholesterol: 60 mg/dL (ref 0–99)
NonHDL: 91.47
Total CHOL/HDL Ratio: 3
Triglycerides: 158 mg/dL — ABNORMAL HIGH (ref 0.0–149.0)
VLDL: 31.6 mg/dL (ref 0.0–40.0)

## 2022-11-15 LAB — TSH: TSH: 1.76 u[IU]/mL (ref 0.35–5.50)

## 2022-11-15 LAB — HEMOGLOBIN A1C: Hgb A1c MFr Bld: 6.1 % (ref 4.6–6.5)

## 2022-11-19 ENCOUNTER — Ambulatory Visit (INDEPENDENT_AMBULATORY_CARE_PROVIDER_SITE_OTHER): Payer: Medicare HMO | Admitting: Internal Medicine

## 2022-11-19 VITALS — BP 112/70 | HR 90 | Temp 98.1°F | Resp 16 | Ht 61.0 in | Wt 153.0 lb

## 2022-11-19 DIAGNOSIS — I10 Essential (primary) hypertension: Secondary | ICD-10-CM | POA: Diagnosis not present

## 2022-11-19 DIAGNOSIS — R739 Hyperglycemia, unspecified: Secondary | ICD-10-CM

## 2022-11-19 DIAGNOSIS — H6121 Impacted cerumen, right ear: Secondary | ICD-10-CM | POA: Diagnosis not present

## 2022-11-19 DIAGNOSIS — E785 Hyperlipidemia, unspecified: Secondary | ICD-10-CM

## 2022-11-19 DIAGNOSIS — D473 Essential (hemorrhagic) thrombocythemia: Secondary | ICD-10-CM

## 2022-11-19 NOTE — Progress Notes (Signed)
Subjective:    [name], female    DOB: Mar 20, 1946, 77 y.o.   MRN: [id number]  Patient here for  Chief Complaint  Patient presents with   Medical Management of Chronic Issues    HPI Here to follow up regarding hypercholesterolemia and hypertension. She is doing relatively well.  No chest pain or sob reported.  No cough or congestion.  No acid reflux.  No abdominal pain or bowel change reported.  Sty - right eye.  Just noticed yesterday.  Has been using an ointment and warm compresses. Is better.      Past Medical History:  Diagnosis Date   Blood in the stool    10 yrs ago   COVID-19 07/01/2021   Runny nose 12/15. (+) test 12/17.  Symptom resolution 07/02/21.   Diverticulitis    GERD (gastroesophageal reflux disease)    History of abnormal Pap smear    History of chicken pox    History of colon polyps    History of kidney stones    h/o    Hypercholesterolemia    Hypertension    Lumbosacral spondylolysis    Seronegative rheumatoid arthritis [facility])    Past Surgical History:  Procedure Laterality Date   CATARACT EXTRACTION W/PHACO Right 07/12/2021   Procedure: CATARACT EXTRACTION PHACO AND INTRAOCULAR LENS PLACEMENT ([place]) RIGHT TORIC LENS;  Surgeon: Lockie Mola, MD;  Location: [facility];  Service: Ophthalmology;  Laterality: Right;  7.70 1:08.7   CATARACT EXTRACTION W/PHACO Left 07/26/2021   Procedure: CATARACT EXTRACTION PHACO AND INTRAOCULAR LENS PLACEMENT ([place]) LEFT TORIC LENS 6.24 01:02.2;  Surgeon: Lockie Mola, MD;  Location: [facility];  Service: Ophthalmology;  Laterality: Left;   COLONOSCOPY     DILATION AND CURETTAGE OF UTERUS     KNEE ARTHROPLASTY Right 05/13/2019   Procedure: COMPUTER ASSISTED TOTAL KNEE ARTHROPLASTY RIGHT;  Surgeon: Donato Heinz, MD;  Location: [facility];  Service: Orthopedics;  Laterality: Right;   LEG / ANKLE SOFT TISSUE BIOPSY  2011   to confirm arthritis   Family History  Problem  Relation Age of Onset   Arthritis Father    Hyperlipidemia Father    Heart disease Father    Hypertension Father    Diabetes Father    Pulmonary fibrosis Father    Arthritis Mother    Heart disease Mother    Hyperlipidemia Mother    Hypertension Mother    Colon cancer Maternal Aunt    Colon cancer Maternal Uncle    Breast cancer Neg Hx    Social History   Socioeconomic History   Marital status: Divorced    Spouse name: Not on file   Number of children: Not on file   Years of education: Not on file   Highest education level: Not on file  Occupational History   Not on file  Tobacco Use   Smoking status: Never   Smokeless tobacco: Never  Vaping Use   Vaping Use: Never used  Substance and Sexual Activity   Alcohol use: Yes    Alcohol/week: 0.0 standard drinks of alcohol    Comment: rare   Drug use: No   Sexual activity: Not on file  Other Topics Concern   Not on file  Social History Narrative   Not on file   Social Determinants of Health   Financial Resource Strain: Low Risk  (09/06/2021)   Overall Financial Resource Strain (CARDIA)    Difficulty of Paying Living Expenses: Not hard at all  Food Insecurity: No Food Insecurity (02/22/2021)   Hunger Vital Sign    Worried About Running Out of Food in the Last Year: Never true    Ran Out of Food in the Last Year: Never true  Transportation Needs: No Transportation Needs (02/22/2021)   PRAPARE - Administrator, Civil Service (Medical): No    Lack of Transportation (Non-Medical): No  Physical Activity: Sufficiently Active (02/22/2021)   Exercise Vital Sign    Days of Exercise per Week: 5 days    Minutes of Exercise per Session: 30 min  Stress: No Stress Concern Present (02/22/2021)   [employer] of Occupational Health - Occupational Stress Questionnaire    Feeling of Stress : Not at all  Social Connections: Unknown (02/22/2021)   Social Connection and Isolation Panel [NHANES]    Frequency of  Communication with Friends and Family: More than three times a week    Frequency of Social Gatherings with Friends and Family: More than three times a week    Attends Religious Services: Not on Marketing executive or Organizations: Not on file    Attends Banker Meetings: Not on file    Marital Status: Not on file     Review of Systems  Constitutional:  Negative for appetite change and unexpected weight change.  HENT:  Negative for congestion and sinus pressure.   Respiratory:  Negative for cough, chest tightness and shortness of breath.   Cardiovascular:  Negative for chest pain, palpitations and leg swelling.  Gastrointestinal:  Negative for abdominal pain, diarrhea, nausea and vomiting.  Genitourinary:  Negative for difficulty urinating and dysuria.  Musculoskeletal:  Negative for joint swelling and myalgias.  Skin:  Negative for color change and rash.  Neurological:  Negative for dizziness and headaches.  Psychiatric/Behavioral:  Negative for agitation and dysphoric mood.        Objective:     BP 112/70   Pulse 90   Temp 98.1 F (36.7 C)   Resp 16   Ht 5\' 1"  (1.549 m)   Wt 153 lb (69.4 kg)   LMP 07/16/1996   SpO2 97%   BMI 28.91 kg/m  Wt Readings from Last 3 Encounters:  11/19/22 153 lb (69.4 kg)  07/20/22 150 lb 12.8 oz (68.4 kg)  04/10/22 162 lb 12.8 oz (73.8 kg)    Physical Exam Vitals reviewed.  Constitutional:      General: She is not in acute distress.    Appearance: Normal appearance.  HENT:     Head: Normocephalic and atraumatic.     Right Ear: External ear normal. There is impacted cerumen.     Left Ear: External ear normal.  Eyes:     General: No scleral icterus.       Right eye: No discharge.        Left eye: No discharge.     Conjunctiva/sclera: Conjunctivae normal.  Neck:     Thyroid: No thyromegaly.  Cardiovascular:     Rate and Rhythm: Normal rate and regular rhythm.  Pulmonary:     Effort: No respiratory  distress.     Breath sounds: Normal breath sounds. No wheezing.  Abdominal:     General: Bowel sounds are normal.     Palpations: Abdomen is soft.     Tenderness: There is no abdominal tenderness.  Musculoskeletal:        General: No swelling or tenderness.     Cervical back: Neck supple. No tenderness.  Lymphadenopathy:     Cervical: No cervical adenopathy.  Skin:    Findings: No erythema or rash.  Neurological:     Mental Status: She is alert.  Psychiatric:        Mood and Affect: Mood normal.        Behavior: Behavior normal.      Outpatient Encounter Medications as of 11/19/2022  Medication Sig   aspirin EC 81 MG tablet Take 81 mg by mouth daily.   calcium carbonate (TUMS EX) 750 MG chewable tablet Chew 1 tablet by mouth daily.   diphenhydramine-acetaminophen (TYLENOL PM) 25-500 MG TABS tablet Take 1 tablet by mouth at bedtime.   lisinopril (ZESTRIL) 10 MG tablet Take 1 tablet (10 mg total) by mouth daily.   metFORMIN (GLUCOPHAGE-XR) 500 MG 24 hr tablet TAKE 1 TABLET BY MOUTH TWICE DAILY WITH A MEAL   Multiple Vitamin (MULTIVITAMIN WITH MINERALS) TABS tablet Take 1 tablet by mouth daily.   niacin 50 MG tablet Take 50 mg by mouth at bedtime.   Omega-3 Fatty Acids (FISH OIL) 1000 MG CAPS Take 1 capsule by mouth daily.   pantoprazole (PROTONIX) 40 MG tablet TAKE 1 TABLET DAILY   Probiotic Product (PROBIOTIC PO) Take 1 capsule by mouth daily.   rosuvastatin (CRESTOR) 20 MG tablet Take 1 tablet (20 mg total) by mouth daily.   traMADol (ULTRAM) 50 MG tablet 1-2 po bid prn   vitamin E 180 MG (400 UNITS) capsule Take 400 Units by mouth daily.   [DISCONTINUED] simvastatin (ZOCOR) 20 MG tablet Take 1 tablet (20 mg total) by mouth every evening.   No facility-administered encounter medications on file as of 11/19/2022.     Lab Results  Component Value Date   WBC 5.9 11/15/2022   HGB 13.1 11/15/2022   HCT 38.9 11/15/2022   PLT 530.0 (H) 11/15/2022   GLUCOSE 101 (H) 11/15/2022    CHOL 131 11/15/2022   TRIG 158.0 (H) 11/15/2022   HDL 39.80 11/15/2022   LDLDIRECT 92.0 04/10/2022   LDLCALC 60 11/15/2022   ALT 11 09/18/2022   AST 16 09/18/2022   NA 139 11/15/2022   K 4.5 11/15/2022   CL 104 11/15/2022   CREATININE 0.98 11/15/2022   BUN 25 (H) 11/15/2022   CO2 26 11/15/2022   TSH 1.76 11/15/2022   INR 0.9 07/15/2019   HGBA1C 6.1 11/15/2022    US Renal  Result Date: 05/03/2022 CLINICAL DATA:  Decreased GFR EXAM: RENAL / URINARY TRACT ULTRASOUND COMPLETE COMPARISON:  None Available. FINDINGS: Right Kidney: Renal measurements: 8.9 x 4.7 x 5 0 cm = volume: 111 mL. Echogenicity within normal limits. No mass or hydronephrosis visualized. Left Kidney: Renal measurements: 10.9 x 6 x 5.6 cm = volume: 194 mL. Contains a 12 mm cyst. No follow-up imaging recommended for the cyst. Bladder: Appears normal for degree of bladder distention. Other: None. IMPRESSION: No significant abnormalities. Electronically Signed   By: Gerome Sam III M.D.   On: 05/03/2022 15:24       Assessment & Plan:  Primary hypertension Assessment & Plan: Continue lisinopril.  Blood pressure as outlined.  No changes.  Follow metabolic panel.   Orders: -     Basic metabolic panel; Future  Hyperlipidemia, unspecified hyperlipidemia type Assessment & Plan: On crestor.  Low cholesterol diet and exercise.  Follow lipid panel and liver function tests,   Lab Results  Component Value Date   CHOL 131 11/15/2022   HDL 39.80 11/15/2022   LDLCALC 60 11/15/2022  LDLDIRECT 92.0 04/10/2022   TRIG 158.0 (H) 11/15/2022   CHOLHDL 3 11/15/2022     Orders: -     Lipid panel; Future -     Hepatic function panel; Future  Hyperglycemia Assessment & Plan: Low carb diet and exercise.  Follow met b and a1c.    Orders: -     Hemoglobin A1c; Future  Essential thrombocytosis (HCC) Assessment & Plan: Minimal elevation.  Has seen hematology.  Recommended continued f/u.  Follow cbc to confirm stable.    Orders: -     CBC with Differential/Platelet; Future  Impacted cerumen of right ear Assessment & Plan: Debrox as directed.  Will call if desires ear irrigation.       Dale Avon, MD

## 2022-11-25 ENCOUNTER — Encounter: Payer: Self-pay | Admitting: Internal Medicine

## 2022-11-25 DIAGNOSIS — H6120 Impacted cerumen, unspecified ear: Secondary | ICD-10-CM | POA: Insufficient documentation

## 2022-11-25 NOTE — Assessment & Plan Note (Signed)
Low carb diet and exercise.  Follow met b and a1c.   

## 2022-11-25 NOTE — Assessment & Plan Note (Signed)
Minimal elevation.  Has seen hematology.  Recommended continued f/u.  Follow cbc to confirm stable.  

## 2022-11-25 NOTE — Assessment & Plan Note (Signed)
Debrox as directed.  Will call if desires ear irrigation.

## 2022-11-25 NOTE — Assessment & Plan Note (Signed)
On crestor.  Low cholesterol diet and exercise.  Follow lipid panel and liver function tests,   Lab Results  Component Value Date   CHOL 131 11/15/2022   HDL 39.80 11/15/2022   LDLCALC 60 11/15/2022   LDLDIRECT 92.0 04/10/2022   TRIG 158.0 (H) 11/15/2022   CHOLHDL 3 11/15/2022

## 2022-11-25 NOTE — Assessment & Plan Note (Signed)
Continue lisinopril.  Blood pressure as outlined.  No changes.  Follow metabolic panel.  

## 2022-12-24 ENCOUNTER — Other Ambulatory Visit: Payer: Self-pay

## 2022-12-24 ENCOUNTER — Encounter: Payer: Self-pay | Admitting: Internal Medicine

## 2022-12-24 MED ORDER — PANTOPRAZOLE SODIUM 40 MG PO TBEC: 40.0000 mg | DELAYED_RELEASE_TABLET | Freq: Every day | ORAL | 3 refills | Status: DC

## 2022-12-24 NOTE — Telephone Encounter (Signed)
LMTCB. Need to know if this needs to go to [facility] or [facility].

## 2022-12-24 NOTE — Telephone Encounter (Signed)
Pt called back and I read the message to her and she stated it needs to go to [facility]

## 2022-12-26 ENCOUNTER — Encounter: Payer: Self-pay | Admitting: Internal Medicine

## 2022-12-26 NOTE — Telephone Encounter (Signed)
Ok

## 2022-12-31 ENCOUNTER — Encounter: Payer: Self-pay | Admitting: Internal Medicine

## 2022-12-31 NOTE — Telephone Encounter (Signed)
Pt is aware. She is going to send my chart with patient info to call and schedule

## 2023-01-10 NOTE — Telephone Encounter (Signed)
New patient appt scheduled.

## 2023-01-21 ENCOUNTER — Other Ambulatory Visit: Payer: Self-pay | Admitting: Internal Medicine

## 2023-01-21 DIAGNOSIS — Z1231 Encounter for screening mammogram for malignant neoplasm of breast: Secondary | ICD-10-CM

## 2023-02-08 ENCOUNTER — Ambulatory Visit
Admission: RE | Admit: 2023-02-08 | Discharge: 2023-02-08 | Disposition: A | Discharge status: Home / Self Care | Payer: Medicare HMO | Source: Ambulatory Visit | Attending: Internal Medicine | Admitting: Internal Medicine

## 2023-02-08 DIAGNOSIS — Z1231 Encounter for screening mammogram for malignant neoplasm of breast: Secondary | ICD-10-CM | POA: Diagnosis not present

## 2023-02-25 DIAGNOSIS — E785 Hyperlipidemia, unspecified: Secondary | ICD-10-CM | POA: Diagnosis not present

## 2023-02-25 DIAGNOSIS — Z008 Encounter for other general examination: Secondary | ICD-10-CM | POA: Diagnosis not present

## 2023-02-25 DIAGNOSIS — I10 Essential (primary) hypertension: Secondary | ICD-10-CM | POA: Diagnosis not present

## 2023-02-25 DIAGNOSIS — Z881 Allergy status to other antibiotic agents status: Secondary | ICD-10-CM | POA: Diagnosis not present

## 2023-02-25 DIAGNOSIS — M069 Rheumatoid arthritis, unspecified: Secondary | ICD-10-CM | POA: Diagnosis not present

## 2023-02-25 DIAGNOSIS — M545 Low back pain, unspecified: Secondary | ICD-10-CM | POA: Diagnosis not present

## 2023-02-25 DIAGNOSIS — Z7984 Long term (current) use of oral hypoglycemic drugs: Secondary | ICD-10-CM | POA: Diagnosis not present

## 2023-02-25 DIAGNOSIS — Z809 Family history of malignant neoplasm, unspecified: Secondary | ICD-10-CM | POA: Diagnosis not present

## 2023-02-25 DIAGNOSIS — R32 Unspecified urinary incontinence: Secondary | ICD-10-CM | POA: Diagnosis not present

## 2023-02-25 DIAGNOSIS — K219 Gastro-esophageal reflux disease without esophagitis: Secondary | ICD-10-CM | POA: Diagnosis not present

## 2023-02-25 DIAGNOSIS — E119 Type 2 diabetes mellitus without complications: Secondary | ICD-10-CM | POA: Diagnosis not present

## 2023-02-25 DIAGNOSIS — Z8616 Personal history of COVID-19: Secondary | ICD-10-CM | POA: Diagnosis not present

## 2023-02-25 DIAGNOSIS — Z8249 Family history of ischemic heart disease and other diseases of the circulatory system: Secondary | ICD-10-CM | POA: Diagnosis not present

## 2023-02-28 ENCOUNTER — Encounter (INDEPENDENT_AMBULATORY_CARE_PROVIDER_SITE_OTHER): Payer: Self-pay

## 2023-03-08 ENCOUNTER — Other Ambulatory Visit: Payer: Self-pay | Admitting: Internal Medicine

## 2023-03-08 DIAGNOSIS — R7303 Prediabetes: Secondary | ICD-10-CM

## 2023-03-12 DIAGNOSIS — E119 Type 2 diabetes mellitus without complications: Secondary | ICD-10-CM | POA: Diagnosis not present

## 2023-03-12 DIAGNOSIS — H26491 Other secondary cataract, right eye: Secondary | ICD-10-CM | POA: Diagnosis not present

## 2023-03-12 DIAGNOSIS — Z961 Presence of intraocular lens: Secondary | ICD-10-CM | POA: Diagnosis not present

## 2023-03-12 DIAGNOSIS — H43813 Vitreous degeneration, bilateral: Secondary | ICD-10-CM | POA: Diagnosis not present

## 2023-03-12 LAB — HM DIABETES EYE EXAM

## 2023-03-20 ENCOUNTER — Other Ambulatory Visit (INDEPENDENT_AMBULATORY_CARE_PROVIDER_SITE_OTHER): Payer: Medicare HMO

## 2023-03-20 DIAGNOSIS — R739 Hyperglycemia, unspecified: Secondary | ICD-10-CM

## 2023-03-20 DIAGNOSIS — I10 Essential (primary) hypertension: Secondary | ICD-10-CM

## 2023-03-20 DIAGNOSIS — D473 Essential (hemorrhagic) thrombocythemia: Secondary | ICD-10-CM | POA: Diagnosis not present

## 2023-03-20 DIAGNOSIS — E785 Hyperlipidemia, unspecified: Secondary | ICD-10-CM | POA: Diagnosis not present

## 2023-03-20 LAB — BASIC METABOLIC PANEL
BUN: 25 mg/dL — ABNORMAL HIGH (ref 6–23)
CO2: 27 meq/L (ref 19–32)
Calcium: 9.7 mg/dL (ref 8.4–10.5)
Chloride: 104 meq/L (ref 96–112)
Creatinine, Ser: 0.93 mg/dL (ref 0.40–1.20)
GFR: 58.86 mL/min — ABNORMAL LOW (ref >60.00)
Glucose, Bld: 95 mg/dL (ref 70–99)
Potassium: 4.2 meq/L (ref 3.5–5.1)
Sodium: 139 meq/L (ref 135–145)

## 2023-03-20 LAB — HEPATIC FUNCTION PANEL
ALT: 12 U/L (ref 0–35)
AST: 18 U/L (ref 0–37)
Albumin: 4 g/dL (ref 3.5–5.2)
Alkaline Phosphatase: 52 U/L (ref 39–117)
Bilirubin, Direct: 0.1 mg/dL (ref 0.0–0.3)
Total Bilirubin: 0.4 mg/dL (ref 0.2–1.2)
Total Protein: 7.4 g/dL (ref 6.0–8.3)

## 2023-03-20 LAB — CBC WITH DIFFERENTIAL/PLATELET
Basophils Absolute: 0.1 10*3/uL (ref 0.0–0.1)
Basophils Relative: 1.2 % (ref 0.0–3.0)
Eosinophils Absolute: 0.2 10*3/uL (ref 0.0–0.7)
Eosinophils Relative: 3.1 % (ref 0.0–5.0)
HCT: 39.3 % (ref 36.0–46.0)
Hemoglobin: 12.7 g/dL (ref 12.0–15.0)
Lymphocytes Relative: 37.5 % (ref 12.0–46.0)
Lymphs Abs: 2.2 10*3/uL (ref 0.7–4.0)
MCHC: 32.3 g/dL (ref 30.0–36.0)
MCV: 91.8 fl (ref 78.0–100.0)
Monocytes Absolute: 0.4 10*3/uL (ref 0.1–1.0)
Monocytes Relative: 7.7 % (ref 3.0–12.0)
Neutro Abs: 3 10*3/uL (ref 1.4–7.7)
Neutrophils Relative %: 50.5 % (ref 43.0–77.0)
Platelets: 535 10*3/uL — ABNORMAL HIGH (ref 150.0–400.0)
RBC: 4.29 Mil/uL (ref 3.87–5.11)
RDW: 14 % (ref 11.5–15.5)
WBC: 5.9 10*3/uL (ref 4.0–10.5)

## 2023-03-20 LAB — LIPID PANEL
Cholesterol: 121 mg/dL (ref 0–200)
HDL: 32.4 mg/dL — ABNORMAL LOW (ref >39.00)
LDL Cholesterol: 46 mg/dL (ref 0–99)
NonHDL: 88.59
Total CHOL/HDL Ratio: 4
Triglycerides: 212 mg/dL — ABNORMAL HIGH (ref 0.0–149.0)
VLDL: 42.4 mg/dL — ABNORMAL HIGH (ref 0.0–40.0)

## 2023-03-20 LAB — HEMOGLOBIN A1C: Hgb A1c MFr Bld: 6 % (ref 4.6–6.5)

## 2023-03-21 ENCOUNTER — Telehealth: Payer: Self-pay

## 2023-03-21 NOTE — Telephone Encounter (Signed)
Patient found lab results in my chart

## 2023-03-21 NOTE — Telephone Encounter (Signed)
Patient states she had labs yesterday morning at 9am and her results are not on MyChart yet.  Patient states she would like to know if they are ready yet.  Patient states Dr. Dale Colfax usually asks her if she has had a chance to review her results in case she has any questions.  Patient states she has an appointment with Dr. Lorin Picket tomorrow at 2pm.

## 2023-03-22 ENCOUNTER — Ambulatory Visit (INDEPENDENT_AMBULATORY_CARE_PROVIDER_SITE_OTHER): Payer: Medicare HMO | Admitting: Internal Medicine

## 2023-03-22 ENCOUNTER — Encounter: Payer: Self-pay | Admitting: Internal Medicine

## 2023-03-22 VITALS — BP 122/72 | HR 96 | Temp 98.2°F | Ht 61.0 in | Wt 154.8 lb

## 2023-03-22 DIAGNOSIS — D473 Essential (hemorrhagic) thrombocythemia: Secondary | ICD-10-CM

## 2023-03-22 DIAGNOSIS — I10 Essential (primary) hypertension: Secondary | ICD-10-CM

## 2023-03-22 DIAGNOSIS — R739 Hyperglycemia, unspecified: Secondary | ICD-10-CM

## 2023-03-22 DIAGNOSIS — E785 Hyperlipidemia, unspecified: Secondary | ICD-10-CM | POA: Diagnosis not present

## 2023-03-22 DIAGNOSIS — Z860100 Personal history of colon polyps, unspecified: Secondary | ICD-10-CM

## 2023-03-22 DIAGNOSIS — K649 Unspecified hemorrhoids: Secondary | ICD-10-CM

## 2023-03-22 DIAGNOSIS — D75839 Thrombocytosis, unspecified: Secondary | ICD-10-CM

## 2023-03-22 DIAGNOSIS — Z0000 Encounter for general adult medical examination without abnormal findings: Secondary | ICD-10-CM

## 2023-03-22 DIAGNOSIS — Z23 Encounter for immunization: Secondary | ICD-10-CM

## 2023-03-22 DIAGNOSIS — D692 Other nonthrombocytopenic purpura: Secondary | ICD-10-CM

## 2023-03-22 MED ORDER — HYDROCORT-PRAMOXINE (PERIANAL) 2.5-1 % EX CREA: 1.0000 | TOPICAL_CREAM | Freq: Two times a day (BID) | CUTANEOUS | 0 refills | Status: DC

## 2023-03-22 NOTE — Progress Notes (Unsigned)
Subjective:    [name], female    DOB: 1945/09/14, 77 y.o.   MRN: [id number]  Patient here for  Chief Complaint  Patient presents with  . Annual Exam    HPI Here for a physical exam.    Past Medical History:  Diagnosis Date  . Blood in the stool    10 yrs ago  . COVID-19 07/01/2021   Runny nose 12/15. (+) test 12/17.  Symptom resolution 07/02/21.  . Diverticulitis   . GERD (gastroesophageal reflux disease)   . History of abnormal Pap smear   . History of chicken pox   . History of colon polyps   . History of kidney stones    h/o   . Hypercholesterolemia   . Hypertension   . Lumbosacral spondylolysis   . Seronegative rheumatoid arthritis [facility])    Past Surgical History:  Procedure Laterality Date  . CATARACT EXTRACTION W/PHACO Right 07/12/2021   Procedure: CATARACT EXTRACTION PHACO AND INTRAOCULAR LENS PLACEMENT (IOC) RIGHT TORIC LENS;  Surgeon: Lockie Mola, MD;  Location: [facility];  Service: Ophthalmology;  Laterality: Right;  7.70 1:08.7  . CATARACT EXTRACTION W/PHACO Left 07/26/2021   Procedure: CATARACT EXTRACTION PHACO AND INTRAOCULAR LENS PLACEMENT (IOC) LEFT TORIC LENS 6.24 01:02.2;  Surgeon: Lockie Mola, MD;  Location: [facility];  Service: Ophthalmology;  Laterality: Left;  . COLONOSCOPY    . DILATION AND CURETTAGE OF UTERUS    . KNEE ARTHROPLASTY Right 05/13/2019   Procedure: COMPUTER ASSISTED TOTAL KNEE ARTHROPLASTY RIGHT;  Surgeon: Donato Heinz, MD;  Location: [facility];  Service: Orthopedics;  Laterality: Right;  . LEG / ANKLE SOFT TISSUE BIOPSY  2011   to confirm arthritis   Family History  Problem Relation Age of Onset  . Arthritis Father   . Hyperlipidemia Father   . Heart disease Father   . Hypertension Father   . Diabetes Father   . Pulmonary fibrosis Father   . Arthritis Mother   . Heart disease Mother   . Hyperlipidemia Mother   . Hypertension Mother   . Colon cancer Maternal Aunt    . Colon cancer Maternal Uncle   . Breast cancer Neg Hx    Social History   Socioeconomic History  . Marital status: Divorced    Spouse name: Not on file  . Number of children: Not on file  . Years of education: Not on file  . Highest education level: Not on file  Occupational History  . Not on file  Tobacco Use  . Smoking status: Never  . Smokeless tobacco: Never  Vaping Use  . Vaping status: Never Used  Substance and Sexual Activity  . Alcohol use: Yes    Alcohol/week: 0.0 standard drinks of alcohol    Comment: rare  . Drug use: No  . Sexual activity: Not on file  Other Topics Concern  . Not on file  Social History Narrative  . Not on file   Social Determinants of Health   Financial Resource Strain: Low Risk  (09/06/2021)   Overall Financial Resource Strain (CARDIA)   . Difficulty of Paying Living Expenses: Not hard at all  Food Insecurity: No Food Insecurity (02/22/2021)   Hunger Vital Sign   . Worried About Programme researcher, broadcasting/film/video in the Last Year: Never true   . Ran Out of Food in the Last Year: Never true  Transportation Needs: No Transportation Needs (02/22/2021)   PRAPARE - Transportation   . Lack of  Transportation (Medical): No   . Lack of Transportation (Non-Medical): No  Physical Activity: Sufficiently Active (02/22/2021)   Exercise Vital Sign   . Days of Exercise per Week: 5 days   . Minutes of Exercise per Session: 30 min  Stress: No Stress Concern Present (02/22/2021)   [employer] of Occupational Health - Occupational Stress Questionnaire   . Feeling of Stress : Not at all  Social Connections: Unknown (02/22/2021)   Social Connection and Isolation Panel [NHANES]   . Frequency of Communication with Friends and Family: More than three times a week   . Frequency of Social Gatherings with Friends and Family: More than three times a week   . Attends Religious Services: Not on file   . Active Member of Clubs or Organizations: Not on file   . Attends Occupational hygienist Meetings: Not on file   . Marital Status: Not on file     Review of Systems     Objective:     BP 122/72   Pulse (!) 102   Temp 98.2 F (36.8 C) (Oral)   Ht 5\' 1"  (1.549 m)   Wt 154 lb 12.8 oz (70.2 kg)   LMP 07/16/1996   SpO2 97%   BMI 29.25 kg/m  Wt Readings from Last 3 Encounters:  03/22/23 154 lb 12.8 oz (70.2 kg)  11/19/22 153 lb (69.4 kg)  07/20/22 150 lb 12.8 oz (68.4 kg)    Physical Exam   Outpatient Encounter Medications as of 03/22/2023  Medication Sig  . aspirin EC 81 MG tablet Take 81 mg by mouth daily.  . calcium carbonate (TUMS EX) 750 MG chewable tablet Chew 1 tablet by mouth daily.  . diphenhydramine-acetaminophen (TYLENOL PM) 25-500 MG TABS tablet Take 1 tablet by mouth at bedtime.  Marland Kitchen lisinopril (ZESTRIL) 10 MG tablet Take 1 tablet (10 mg total) by mouth daily.  . metFORMIN (GLUCOPHAGE-XR) 500 MG 24 hr tablet TAKE 1 TABLET BY MOUTH TWICE DAILY WITH A MEAL  . Multiple Vitamin (MULTIVITAMIN WITH MINERALS) TABS tablet Take 1 tablet by mouth daily.  . niacin 50 MG tablet Take 50 mg by mouth at bedtime.  . Omega-3 Fatty Acids (FISH OIL) 1000 MG CAPS Take 1 capsule by mouth daily.  . pantoprazole (PROTONIX) 40 MG tablet Take 1 tablet (40 mg total) by mouth daily.  . Probiotic Product (PROBIOTIC PO) Take 1 capsule by mouth daily.  . rosuvastatin (CRESTOR) 20 MG tablet Take 1 tablet (20 mg total) by mouth daily.  . traMADol (ULTRAM) 50 MG tablet 1-2 po bid prn  . vitamin E 180 MG (400 UNITS) capsule Take 400 Units by mouth daily.   No facility-administered encounter medications on file as of 03/22/2023.     Lab Results  Component Value Date   WBC 5.9 03/20/2023   HGB 12.7 03/20/2023   HCT 39.3 03/20/2023   PLT 535.0 (H) 03/20/2023   GLUCOSE 95 03/20/2023   CHOL 121 03/20/2023   TRIG 212.0 (H) 03/20/2023   HDL 32.40 (L) 03/20/2023   LDLDIRECT 92.0 04/10/2022   LDLCALC 46 03/20/2023   ALT 12 03/20/2023   AST 18 03/20/2023   NA 139  03/20/2023   K 4.2 03/20/2023   CL 104 03/20/2023   CREATININE 0.93 03/20/2023   BUN 25 (H) 03/20/2023   CO2 27 03/20/2023   TSH 1.76 11/15/2022   INR 0.9 07/15/2019   HGBA1C 6.0 03/20/2023    MM 3D SCREENING MAMMOGRAM BILATERAL BREAST  Result Date: 02/12/2023  CLINICAL DATA:  Screening. EXAM: DIGITAL SCREENING BILATERAL MAMMOGRAM WITH TOMOSYNTHESIS AND CAD TECHNIQUE: Bilateral screening digital craniocaudal and mediolateral oblique mammograms were obtained. Bilateral screening digital breast tomosynthesis was performed. The images were evaluated with computer-aided detection. COMPARISON:  Previous exam(s). ACR Breast Density Category a: The breasts are almost entirely fatty. FINDINGS: There are no findings suspicious for malignancy. IMPRESSION: No mammographic evidence of malignancy. A result letter of this screening mammogram will be mailed directly to the patient. RECOMMENDATION: Screening mammogram in one year. (Code:SM-B-01Y) BI-RADS CATEGORY  1: Negative. Electronically Signed   By: Amie Portland M.D.   On: 02/12/2023 08:45       Assessment & Plan:  Health care maintenance Assessment & Plan: Physical today 03/22/23..  Mammogram 02/08/23 - Birads I. Colonoscopy recently as outlined.  Follow.    Hyperlipidemia, unspecified hyperlipidemia type  Hyperglycemia     Dale Arbutus, MD

## 2023-03-22 NOTE — Assessment & Plan Note (Signed)
Physical today 03/22/23..  Mammogram 02/08/23 - Birads I. Colonoscopy recently as outlined.  Follow.

## 2023-03-24 ENCOUNTER — Encounter: Payer: Self-pay | Admitting: Internal Medicine

## 2023-03-24 DIAGNOSIS — D692 Other nonthrombocytopenic purpura: Secondary | ICD-10-CM | POA: Insufficient documentation

## 2023-03-24 NOTE — Assessment & Plan Note (Signed)
Low carb diet and exercise.  Follow met b and a1c.   

## 2023-03-24 NOTE — Assessment & Plan Note (Signed)
On crestor.  Low cholesterol diet and exercise.  Follow lipid panel and liver function tests,   Lab Results  Component Value Date   CHOL 121 03/20/2023   HDL 32.40 (L) 03/20/2023   LDLCALC 46 03/20/2023   LDLDIRECT 92.0 04/10/2022   TRIG 212.0 (H) 03/20/2023   CHOLHDL 4 03/20/2023

## 2023-03-24 NOTE — Assessment & Plan Note (Signed)
Minimal elevation.  Has seen hematology.  Recommended continued f/u.  Follow cbc to confirm stable. Last platelet count stable.

## 2023-03-24 NOTE — Assessment & Plan Note (Signed)
Easy bruising.  Thin skin. On aspirin.  No other bleeding.  Follow.

## 2023-03-24 NOTE — Assessment & Plan Note (Signed)
Per review, saw Dr Alice Reichert.  Need report.

## 2023-03-24 NOTE — Assessment & Plan Note (Signed)
Hemorrhoid issue - intermittent as outlined. Had recent colonoscopy.  Analpram refilled. Follow.  Notify if persistent.

## 2023-03-24 NOTE — Assessment & Plan Note (Signed)
Continue lisinopril.  Blood pressure as outlined.  No changes.  Follow metabolic panel.  

## 2023-03-26 ENCOUNTER — Encounter: Payer: Self-pay | Admitting: Internal Medicine

## 2023-03-27 NOTE — Telephone Encounter (Signed)
Received office, but did not receive colonoscopy report.  It appears she had in 2021.  Need latest colonoscopy report.

## 2023-03-29 MED ORDER — HYDROCORT-PRAMOXINE (PERIANAL) 2.5-1 % EX CREA: 1.0000 | TOPICAL_CREAM | Freq: Two times a day (BID) | CUTANEOUS | 0 refills | Status: DC

## 2023-03-29 NOTE — Addendum Note (Signed)
Addended by: Warden Fillers on: 03/29/2023 03:56 PM   Modules accepted: Orders

## 2023-05-23 DIAGNOSIS — M47816 Spondylosis without myelopathy or radiculopathy, lumbar region: Secondary | ICD-10-CM | POA: Diagnosis not present

## 2023-05-23 DIAGNOSIS — Z79899 Other long term (current) drug therapy: Secondary | ICD-10-CM | POA: Diagnosis not present

## 2023-06-14 ENCOUNTER — Other Ambulatory Visit: Payer: Self-pay | Admitting: Internal Medicine

## 2023-06-14 DIAGNOSIS — R7303 Prediabetes: Secondary | ICD-10-CM

## 2023-07-18 ENCOUNTER — Other Ambulatory Visit: Payer: Medicare HMO

## 2023-07-18 DIAGNOSIS — D75839 Thrombocytosis, unspecified: Secondary | ICD-10-CM | POA: Diagnosis not present

## 2023-07-18 DIAGNOSIS — E785 Hyperlipidemia, unspecified: Secondary | ICD-10-CM

## 2023-07-18 DIAGNOSIS — R739 Hyperglycemia, unspecified: Secondary | ICD-10-CM | POA: Diagnosis not present

## 2023-07-18 LAB — CBC WITH DIFFERENTIAL/PLATELET
Basophils Absolute: 0.1 10*3/uL (ref 0.0–0.1)
Basophils Relative: 1.1 % (ref 0.0–3.0)
Eosinophils Absolute: 0.1 10*3/uL (ref 0.0–0.7)
Eosinophils Relative: 2 % (ref 0.0–5.0)
HCT: 41.3 % (ref 36.0–46.0)
Hemoglobin: 13.5 g/dL (ref 12.0–15.0)
Lymphocytes Relative: 33.2 % (ref 12.0–46.0)
Lymphs Abs: 1.7 10*3/uL (ref 0.7–4.0)
MCHC: 32.7 g/dL (ref 30.0–36.0)
MCV: 90.5 fL (ref 78.0–100.0)
Monocytes Absolute: 0.4 10*3/uL (ref 0.1–1.0)
Monocytes Relative: 7.2 % (ref 3.0–12.0)
Neutro Abs: 2.9 10*3/uL (ref 1.4–7.7)
Neutrophils Relative %: 56.5 % (ref 43.0–77.0)
Platelets: 448 10*3/uL — ABNORMAL HIGH (ref 150.0–400.0)
RBC: 4.56 Mil/uL (ref 3.87–5.11)
RDW: 14.4 % (ref 11.5–15.5)
WBC: 5.1 10*3/uL (ref 4.0–10.5)

## 2023-07-18 LAB — LIPID PANEL
Cholesterol: 138 mg/dL (ref 0–200)
HDL: 33.9 mg/dL — ABNORMAL LOW (ref >39.00)
LDL Cholesterol: 54 mg/dL (ref 0–99)
NonHDL: 103.82
Total CHOL/HDL Ratio: 4
Triglycerides: 250 mg/dL — ABNORMAL HIGH (ref 0.0–149.0)
VLDL: 50 mg/dL — ABNORMAL HIGH (ref 0.0–40.0)

## 2023-07-18 LAB — BASIC METABOLIC PANEL
BUN: 25 mg/dL — ABNORMAL HIGH (ref 6–23)
CO2: 27 meq/L (ref 19–32)
Calcium: 9.6 mg/dL (ref 8.4–10.5)
Chloride: 103 meq/L (ref 96–112)
Creatinine, Ser: 0.81 mg/dL (ref 0.40–1.20)
GFR: 69.32 mL/min (ref >60.00)
Glucose, Bld: 101 mg/dL — ABNORMAL HIGH (ref 70–99)
Potassium: 3.8 meq/L (ref 3.5–5.1)
Sodium: 140 meq/L (ref 135–145)

## 2023-07-18 LAB — HEPATIC FUNCTION PANEL
ALT: 13 U/L (ref 0–35)
AST: 18 U/L (ref 0–37)
Albumin: 4.3 g/dL (ref 3.5–5.2)
Alkaline Phosphatase: 58 U/L (ref 39–117)
Bilirubin, Direct: 0.1 mg/dL (ref 0.0–0.3)
Total Bilirubin: 0.5 mg/dL (ref 0.2–1.2)
Total Protein: 7.5 g/dL (ref 6.0–8.3)

## 2023-07-18 LAB — HEMOGLOBIN A1C: Hgb A1c MFr Bld: 6.5 % (ref 4.6–6.5)

## 2023-07-20 ENCOUNTER — Other Ambulatory Visit: Payer: Self-pay | Admitting: Internal Medicine

## 2023-07-22 ENCOUNTER — Encounter: Payer: Self-pay | Admitting: Internal Medicine

## 2023-07-22 ENCOUNTER — Ambulatory Visit (INDEPENDENT_AMBULATORY_CARE_PROVIDER_SITE_OTHER): Payer: Medicare HMO | Admitting: Internal Medicine

## 2023-07-22 VITALS — BP 118/70 | HR 88 | Temp 98.0°F | Resp 16 | Ht 61.0 in | Wt 157.2 lb

## 2023-07-22 DIAGNOSIS — D473 Essential (hemorrhagic) thrombocythemia: Secondary | ICD-10-CM | POA: Diagnosis not present

## 2023-07-22 DIAGNOSIS — R739 Hyperglycemia, unspecified: Secondary | ICD-10-CM | POA: Diagnosis not present

## 2023-07-22 DIAGNOSIS — I10 Essential (primary) hypertension: Secondary | ICD-10-CM | POA: Diagnosis not present

## 2023-07-22 DIAGNOSIS — E785 Hyperlipidemia, unspecified: Secondary | ICD-10-CM | POA: Diagnosis not present

## 2023-07-22 NOTE — Progress Notes (Signed)
 Subjective:    [name], female    DOB: 1945/11/08, 78 y.o.   MRN: [id number]  Patient here for  Chief Complaint  Patient presents with   Medical Management of Chronic Issues    HPI Here for a scheduled follow up - f/u regarding hyperglycemia and hypercholesterolemia. A1c increased - 6.5.  discussed sugar and diabetes. Discussed low carb diet and exercise. Prefers to hold on additional medication. Wants to work on diet and exercise. No chest pain or sob reported. Not requiring protonix . Discussed labs.    Past Medical History:  Diagnosis Date   Blood in the stool    10 yrs ago   COVID-19 07/01/2021   Runny nose 12/15. (+) test 12/17.  Symptom resolution 07/02/21.   Diverticulitis    GERD (gastroesophageal reflux disease)    History of abnormal Pap smear    History of chicken pox    History of colon polyps    History of kidney stones    h/o    Hypercholesterolemia    Hypertension    Lumbosacral spondylolysis    Seronegative rheumatoid arthritis [facility])    Past Surgical History:  Procedure Laterality Date   CATARACT EXTRACTION W/PHACO Right 07/12/2021   Procedure: CATARACT EXTRACTION PHACO AND INTRAOCULAR LENS PLACEMENT (IOC) RIGHT TORIC LENS;  Surgeon: Mittie Gaskin, MD;  Location: [facility];  Service: Ophthalmology;  Laterality: Right;  7.70 1:08.7   CATARACT EXTRACTION W/PHACO Left 07/26/2021   Procedure: CATARACT EXTRACTION PHACO AND INTRAOCULAR LENS PLACEMENT (IOC) LEFT TORIC LENS 6.24 01:02.2;  Surgeon: Mittie Gaskin, MD;  Location: [facility];  Service: Ophthalmology;  Laterality: Left;   COLONOSCOPY     DILATION AND CURETTAGE OF UTERUS     KNEE ARTHROPLASTY Right 05/13/2019   Procedure: COMPUTER ASSISTED TOTAL KNEE ARTHROPLASTY RIGHT;  Surgeon: Mardee Lynwood SQUIBB, MD;  Location: [facility];  Service: Orthopedics;  Laterality: Right;   LEG / ANKLE SOFT TISSUE BIOPSY  2011   to confirm arthritis   Family History  Problem  Relation Age of Onset   Arthritis Father    Hyperlipidemia Father    Heart disease Father    Hypertension Father    Diabetes Father    Pulmonary fibrosis Father    Arthritis Mother    Heart disease Mother    Hyperlipidemia Mother    Hypertension Mother    Colon cancer Maternal Aunt    Colon cancer Maternal Uncle    Breast cancer Neg Hx    Social History   Socioeconomic History   Marital status: Divorced    Spouse name: Not on file   Number of children: Not on file   Years of education: Not on file   Highest education level: Not on file  Occupational History   Not on file  Tobacco Use   Smoking status: Never   Smokeless tobacco: Never  Vaping Use   Vaping status: Never Used  Substance and Sexual Activity   Alcohol use: Yes    Alcohol/week: 0.0 standard drinks of alcohol    Comment: rare   Drug use: No   Sexual activity: Not on file  Other Topics Concern   Not on file  Social History Narrative   Not on file   Social Drivers of Health   Financial Resource Strain: Low Risk  (09/06/2021)   Overall Financial Resource Strain (CARDIA)    Difficulty of Paying Living Expenses: Not hard at all  Food Insecurity: No Food Insecurity (02/22/2021)  Hunger Vital Sign    Worried About Running Out of Food in the Last Year: Never true    Ran Out of Food in the Last Year: Never true  Transportation Needs: No Transportation Needs (02/22/2021)   PRAPARE - Administrator, Civil Service (Medical): No    Lack of Transportation (Non-Medical): No  Physical Activity: Sufficiently Active (02/22/2021)   Exercise Vital Sign    Days of Exercise per Week: 5 days    Minutes of Exercise per Session: 30 min  Stress: No Stress Concern Present (02/22/2021)   [employer] of Occupational Health - Occupational Stress Questionnaire    Feeling of Stress : Not at all  Social Connections: Unknown (02/22/2021)   Social Connection and Isolation Panel [NHANES]    Frequency of Communication  with Friends and Family: More than three times a week    Frequency of Social Gatherings with Friends and Family: More than three times a week    Attends Religious Services: Not on Marketing Executive or Organizations: Not on file    Attends Banker Meetings: Not on file    Marital Status: Not on file     Review of Systems  Constitutional:  Negative for appetite change and unexpected weight change.  HENT:  Negative for congestion and sinus pressure.   Respiratory:  Negative for cough, chest tightness and shortness of breath.   Cardiovascular:  Negative for chest pain and palpitations.  Gastrointestinal:  Negative for abdominal pain, diarrhea, nausea and vomiting.  Genitourinary:  Negative for difficulty urinating and dysuria.  Musculoskeletal:  Negative for joint swelling and myalgias.  Skin:  Negative for color change and rash.  Neurological:  Negative for dizziness and light-headedness.  Psychiatric/Behavioral:  Negative for agitation and dysphoric mood.        Objective:     BP 118/70   Pulse 88   Temp 98 F (36.7 C)   Resp 16   Ht 5' 1 (1.549 m)   Wt 157 lb 3.2 oz (71.3 kg)   LMP 07/16/1996   SpO2 98%   BMI 29.70 kg/m  Wt Readings from Last 3 Encounters:  07/22/23 157 lb 3.2 oz (71.3 kg)  03/22/23 154 lb 12.8 oz (70.2 kg)  11/19/22 153 lb (69.4 kg)    Physical Exam Vitals reviewed.  Constitutional:      General: She is not in acute distress.    Appearance: Normal appearance.  HENT:     Head: Normocephalic and atraumatic.     Right Ear: External ear normal.     Left Ear: External ear normal.     Mouth/Throat:     Pharynx: No oropharyngeal exudate or posterior oropharyngeal erythema.  Eyes:     General: No scleral icterus.       Right eye: No discharge.        Left eye: No discharge.     Conjunctiva/sclera: Conjunctivae normal.  Neck:     Thyroid : No thyromegaly.  Cardiovascular:     Rate and Rhythm: Normal rate and regular  rhythm.  Pulmonary:     Effort: No respiratory distress.     Breath sounds: Normal breath sounds. No wheezing.  Abdominal:     General: Bowel sounds are normal.     Palpations: Abdomen is soft.     Tenderness: There is no abdominal tenderness.  Musculoskeletal:        General: No swelling or tenderness.     Cervical  back: Neck supple. No tenderness.  Lymphadenopathy:     Cervical: No cervical adenopathy.  Skin:    Findings: No erythema or rash.  Neurological:     Mental Status: She is alert.  Psychiatric:        Mood and Affect: Mood normal.        Behavior: Behavior normal.      Outpatient Encounter Medications as of 07/22/2023  Medication Sig   aspirin  EC 81 MG tablet Take 81 mg by mouth daily.   calcium  carbonate (TUMS EX) 750 MG chewable tablet Chew 1 tablet by mouth daily.   diphenhydramine -acetaminophen  (TYLENOL  PM) 25-500 MG TABS tablet Take 1 tablet by mouth at bedtime.   hydrocortisone -pramoxine (ANALPRAM HC) 2.5-1 % rectal cream Place 1 Application rectally 2 (two) times daily.   lisinopril  (ZESTRIL ) 10 MG tablet Take 1 tablet (10 mg total) by mouth daily.   metFORMIN  (GLUCOPHAGE -XR) 500 MG 24 hr tablet TAKE 1 TABLET BY MOUTH TWICE DAILY WITH A MEAL   Multiple Vitamin (MULTIVITAMIN WITH MINERALS) TABS tablet Take 1 tablet by mouth daily.   niacin 50 MG tablet Take 50 mg by mouth at bedtime.   Omega-3 Fatty Acids (FISH OIL) 1000 MG CAPS Take 1 capsule by mouth daily.   Probiotic Product (PROBIOTIC PO) Take 1 capsule by mouth daily.   traMADol  (ULTRAM ) 50 MG tablet 1-2 po bid prn   vitamin E  180 MG (400 UNITS) capsule Take 400 Units by mouth daily.   [DISCONTINUED] pantoprazole  (PROTONIX ) 40 MG tablet Take 1 tablet (40 mg total) by mouth daily.   [DISCONTINUED] rosuvastatin  (CRESTOR ) 20 MG tablet Take 1 tablet (20 mg total) by mouth daily.   No facility-administered encounter medications on file as of 07/22/2023.     Lab Results  Component Value Date   WBC 5.1  07/18/2023   HGB 13.5 07/18/2023   HCT 41.3 07/18/2023   PLT 448.0 (H) 07/18/2023   GLUCOSE 101 (H) 07/18/2023   CHOL 138 07/18/2023   TRIG 250.0 (H) 07/18/2023   HDL 33.90 (L) 07/18/2023   LDLDIRECT 92.0 04/10/2022   LDLCALC 54 07/18/2023   ALT 13 07/18/2023   AST 18 07/18/2023   NA 140 07/18/2023   K 3.8 07/18/2023   CL 103 07/18/2023   CREATININE 0.81 07/18/2023   BUN 25 (H) 07/18/2023   CO2 27 07/18/2023   TSH 1.76 11/15/2022   INR 0.9 07/15/2019   HGBA1C 6.5 07/18/2023    MM 3D SCREENING MAMMOGRAM BILATERAL BREAST Result Date: 02/12/2023 CLINICAL DATA:  Screening. EXAM: DIGITAL SCREENING BILATERAL MAMMOGRAM WITH TOMOSYNTHESIS AND CAD TECHNIQUE: Bilateral screening digital craniocaudal and mediolateral oblique mammograms were obtained. Bilateral screening digital breast tomosynthesis was performed. The images were evaluated with computer-aided detection. COMPARISON:  Previous exam(s). ACR Breast Density Category a: The breasts are almost entirely fatty. FINDINGS: There are no findings suspicious for malignancy. IMPRESSION: No mammographic evidence of malignancy. A result letter of this screening mammogram will be mailed directly to the patient. RECOMMENDATION: Screening mammogram in one year. (Code:SM-B-01Y) BI-RADS CATEGORY  1: Negative. Electronically Signed   By: Alm Parkins M.D.   On: 02/12/2023 08:45       Assessment & Plan:  Primary hypertension Assessment & Plan: Continue lisinopril .  Blood pressure as outlined.  No changes.  Follow metabolic panel.    Essential thrombocytosis (HCC) Assessment & Plan: Minimal elevation.  Has seen hematology.  Recommended continued f/u.  Follow cbc to confirm stable. Last platelet count stable.    Hyperlipidemia,  unspecified hyperlipidemia type Assessment & Plan: On crestor .  Low cholesterol diet and exercise.  Follow lipid panel and liver function tests,   Lab Results  Component Value Date   CHOL 138 07/18/2023   HDL 33.90  (L) 07/18/2023   LDLCALC 54 07/18/2023   LDLDIRECT 92.0 04/10/2022   TRIG 250.0 (H) 07/18/2023   CHOLHDL 4 07/18/2023      Hyperglycemia Assessment & Plan: Discussed most recent A1c - 6.5. discussed diabetes and treatment. Wants to work on diet and exercise. Low carb diet and exercise.  Follow met b and a1c.        Allena Hamilton, MD

## 2023-07-27 ENCOUNTER — Encounter: Payer: Self-pay | Admitting: Internal Medicine

## 2023-07-27 NOTE — Assessment & Plan Note (Signed)
Continue lisinopril.  Blood pressure as outlined.  No changes.  Follow metabolic panel.  

## 2023-07-27 NOTE — Assessment & Plan Note (Signed)
 On crestor .  Low cholesterol diet and exercise.  Follow lipid panel and liver function tests,   Lab Results  Component Value Date   CHOL 138 07/18/2023   HDL 33.90 (L) 07/18/2023   LDLCALC 54 07/18/2023   LDLDIRECT 92.0 04/10/2022   TRIG 250.0 (H) 07/18/2023   CHOLHDL 4 07/18/2023

## 2023-07-27 NOTE — Assessment & Plan Note (Signed)
 Discussed most recent A1c - 6.5. discussed diabetes and treatment. Wants to work on diet and exercise. Low carb diet and exercise.  Follow met b and a1c.

## 2023-07-27 NOTE — Assessment & Plan Note (Signed)
Minimal elevation.  Has seen hematology.  Recommended continued f/u.  Follow cbc to confirm stable. Last platelet count stable.

## 2023-08-01 ENCOUNTER — Encounter: Payer: Self-pay | Admitting: Internal Medicine

## 2023-08-02 ENCOUNTER — Other Ambulatory Visit: Payer: Self-pay

## 2023-08-02 MED ORDER — ROSUVASTATIN CALCIUM 20 MG PO TABS: 20.0000 mg | ORAL_TABLET | Freq: Every day | ORAL | 0 refills | Status: DC

## 2023-09-03 ENCOUNTER — Other Ambulatory Visit: Payer: Self-pay | Admitting: Internal Medicine

## 2023-09-18 ENCOUNTER — Ambulatory Visit: Payer: Medicare HMO | Admitting: Podiatry

## 2023-11-06 ENCOUNTER — Ambulatory Visit (INDEPENDENT_AMBULATORY_CARE_PROVIDER_SITE_OTHER)

## 2023-11-06 ENCOUNTER — Ambulatory Visit: Admitting: Podiatry

## 2023-11-06 ENCOUNTER — Encounter: Payer: Self-pay | Admitting: Internal Medicine

## 2023-11-06 ENCOUNTER — Encounter: Payer: Self-pay | Admitting: Podiatry

## 2023-11-06 VITALS — Ht 61.0 in | Wt 157.2 lb

## 2023-11-06 DIAGNOSIS — M21612 Bunion of left foot: Secondary | ICD-10-CM | POA: Diagnosis not present

## 2023-11-06 DIAGNOSIS — M2012 Hallux valgus (acquired), left foot: Secondary | ICD-10-CM | POA: Diagnosis not present

## 2023-11-06 DIAGNOSIS — S99922A Unspecified injury of left foot, initial encounter: Secondary | ICD-10-CM | POA: Diagnosis not present

## 2023-11-06 DIAGNOSIS — E785 Hyperlipidemia, unspecified: Secondary | ICD-10-CM

## 2023-11-06 DIAGNOSIS — M21962 Unspecified acquired deformity of left lower leg: Secondary | ICD-10-CM | POA: Diagnosis not present

## 2023-11-06 DIAGNOSIS — M2042 Other hammer toe(s) (acquired), left foot: Secondary | ICD-10-CM

## 2023-11-06 DIAGNOSIS — I10 Essential (primary) hypertension: Secondary | ICD-10-CM

## 2023-11-06 DIAGNOSIS — Z419 Encounter for procedure for purposes other than remedying health state, unspecified: Secondary | ICD-10-CM

## 2023-11-06 DIAGNOSIS — R739 Hyperglycemia, unspecified: Secondary | ICD-10-CM

## 2023-11-06 NOTE — Telephone Encounter (Signed)
 Ok to add vit D to routine labs scheduled for 5/6?

## 2023-11-06 NOTE — Telephone Encounter (Signed)
Order placed for vitamin D

## 2023-11-06 NOTE — Patient Instructions (Signed)
 VISIT SUMMARY:  Today, we discussed your foot pain caused by severe bunion and hammer toe. We reviewed your condition's history, including its worsening over the past six months, and your family history of similar issues. We also discussed your living situation and post-surgery support from your sister.  YOUR PLAN:  -SEVERE HALLUX VALGUS WITH BUNION: Severe hallux valgus with bunion means that your big toe is misaligned, causing a large bump on the side of your foot. We will perform the a great toe fusion procedure to realign the bone and straighten the toe, which includes bunion removal and joint fusion to prevent arthritis and recurrence. Plates and screws will be used to stabilize the toe. Most patients resume normal activities after recovery, but high heels are not recommended. Risks include infection, nerve issues, and bone healing complications.  -ARTHRITIS OF THE FIRST MTP JOINT: Arthritis of the first MTP joint is inflammation and stiffness in the joint at the base of your big toe. During the bunion correction surgery, we will also fuse this joint to eliminate arthritis and prevent it from coming back. Plates and screws will be used to stabilize the joint. Most patients resume normal activities without recurrence of bunion or arthritis.  -SEVERE HAMMER TOE OF THE SECOND TOE: Severe hammer toe of the second toe means that your second toe is bent and dislocated, causing pain and difficulty wearing shoes. We will perform surgery to repair the ligaments, shorten the bone, and stabilize the toe with a permanent screw. Recovery involves no weight-bearing for 2-3 weeks, followed by gradual weight-bearing in a walking boot. Pain management will include a nerve block and narcotic pain medication initially, transitioning to Tylenol  and Motrin.  -POST-SURGICAL FOLLOW-UP: After surgery, you will need to rest, elevate your foot, and avoid weight-bearing initially. Your sister will assist you at home.  Follow-up appointments will monitor your healing and remove stitches. You will need appropriate mobility aids like a knee scooter or rollator.  INSTRUCTIONS:  Our surgery scheduler will call you to schedule the surgery.  Please call us  if you do not hear from her.  Please let me know if you need any mobility aids, such as a knee scooter or rollator, during recovery.  Insurance may or may not cover these.  Please have your primary care doctor check your vitamin D and calcium  level at your next visit with her next month

## 2023-11-06 NOTE — Progress Notes (Signed)
 Subjective:  [name], female    DOB: Sep 19, 1945,  MRN: [id number]  Chief Complaint  Patient presents with   Hammer Toe    Pt is here to discuss surgery options for left hammertoe.    Discussed the use of AI scribe software for clinical note transcription with the patient, who gave verbal consent to proceed.  History of Present Illness Caitlin Bennett is a 78 year old female who presents with foot pain due to severe bunion and hammer toe.  She has experienced a severe hammer toe and bunion for approximately six years, which worsened after delaying treatment due to knee replacement surgery. The condition has progressed to the point where all her shoes cause foot pain, particularly on the toe rather than the bunion.  The pain is primarily located on the toe, with no pain on the side of the foot. Over the last six months, the toe's condition has worsened, with the toe starting to turn inwards.  Her family history reveals that her mother had similar foot issues, and her son also has bunions, suggesting a hereditary component.  She lives alone but will have assistance from her sister, a Designer, jewellery, post-surgery. Her home has no steps, which will facilitate mobility during recovery.  She currently uses rubber shields on her toe to manage the condition and has tried various shoe types for comfort, but finds no relief.  No issues with the lesser toes.      Objective:    Physical Exam VASCULAR: DP and PT pulse palpable. Foot is warm and well-perfused. Capillary fill time is brisk. DERMATOLOGIC: Normal skin turgor, texture, and temperature. No open lesions, rashes, or ulcerations. NEUROLOGIC: Normal sensation to light touch and pressure. No paresthesias on examination. ORTHOPEDIC: Severe hallux valgus deformity with large dorsal medial bunion on left foot. Severe contracted hammer toe of the second toe, less so on lesser toes of left foot. Positive Lachman test at MTP  joint, corn on dorsal PIPJ of second toe on left foot. Second hammer toe is reducible in nature on left foot. Prominent second metatarsal plantar on left foot.    No images are attached to the encounter.    Results RADIOLOGY Left foot X-ray: Severe hallux valgus deformity, arthritic changes in the sesamoid and first MTP joint, severe hammertoe contracture with dorsal dislocation and arthritic changes of the PIP joint, elongated second metatarsal (11/06/2023)   Assessment:   1. Hammertoe of left foot      Plan:  Patient was evaluated and treated and all questions answered.  Assessment and Plan Assessment & Plan Severe hallux valgus with bunion Severe hallux valgus with a large dorsal medial bunion, contributing to second toe displacement and exacerbating hammer toe deformity. Surgical intervention is necessary to correct alignment and prevent recurrence.  Nonoperative treatment including shoe gear changes padding has not helped so far.  Surgical we discussed left first MTP fusion, Weil osteotomy of the second metatarsal plantar plate repair and second hammertoe correction with bone graft from the heel.  Alternatively we discussed elective second hammertoe amputation which she declined.  Anticipated outcome is good, with most patients resuming normal activities, though high heels are not recommended post-surgery. Risks include infection (<5%), nerve issues, and bone healing complications (10%).  All questions addressed.  Informed consent signed and reviewed.  She would prefer to have outpatient surgery at the hospital.    Post-surgical follow-up Post-surgical follow-up plans include recovery expectations and home assistance. She lives alone but will  have assistance from her sister, an Charity fundraiser. Emphasized importance of rest, elevation, and avoiding weight-bearing initially. Follow-up appointments will monitor healing and remove stitches. -Referral for home PT following surgery     Surgical  plan:  Procedure: - Left first MTP fusion, bone graft from heel, second hammertoe correction, plantar plate repair of second MTP and shortening osteotomy of the second  Location: - [facility]  Anesthesia plan: - IV sedation with regional block  Postoperative pain plan: - Tylenol  1000 mg every 6 hours, ibuprofen 600 mg every 6 hours, gabapentin  300 mg every 8 hours x5 days, oxycodone  5 mg 1-2 tabs every 6 hours only as needed  DVT prophylaxis: - ASA 325 mg twice daily  WB Restrictions / DME needs: - Nonweightbearing in boot postop   No follow-ups on file.

## 2023-11-19 ENCOUNTER — Other Ambulatory Visit (INDEPENDENT_AMBULATORY_CARE_PROVIDER_SITE_OTHER): Payer: Medicare HMO

## 2023-11-19 DIAGNOSIS — E785 Hyperlipidemia, unspecified: Secondary | ICD-10-CM | POA: Diagnosis not present

## 2023-11-19 DIAGNOSIS — I10 Essential (primary) hypertension: Secondary | ICD-10-CM | POA: Diagnosis not present

## 2023-11-19 DIAGNOSIS — Z419 Encounter for procedure for purposes other than remedying health state, unspecified: Secondary | ICD-10-CM

## 2023-11-19 DIAGNOSIS — R739 Hyperglycemia, unspecified: Secondary | ICD-10-CM

## 2023-11-19 LAB — TSH: TSH: 2.22 u[IU]/mL (ref 0.35–5.50)

## 2023-11-19 LAB — BASIC METABOLIC PANEL WITH GFR
BUN: 26 mg/dL — ABNORMAL HIGH (ref 6–23)
CO2: 25 meq/L (ref 19–32)
Calcium: 9.3 mg/dL (ref 8.4–10.5)
Chloride: 105 meq/L (ref 96–112)
Creatinine, Ser: 0.83 mg/dL (ref 0.40–1.20)
GFR: 67.16 mL/min (ref >60.00)
Glucose, Bld: 108 mg/dL — ABNORMAL HIGH (ref 70–99)
Potassium: 4.2 meq/L (ref 3.5–5.1)
Sodium: 140 meq/L (ref 135–145)

## 2023-11-19 LAB — HEMOGLOBIN A1C: Hgb A1c MFr Bld: 6.3 % (ref 4.6–6.5)

## 2023-11-19 LAB — LIPID PANEL
Cholesterol: 129 mg/dL (ref 0–200)
HDL: 33.7 mg/dL — ABNORMAL LOW (ref >39.00)
LDL Cholesterol: 54 mg/dL (ref 0–99)
NonHDL: 95
Total CHOL/HDL Ratio: 4
Triglycerides: 206 mg/dL — ABNORMAL HIGH (ref 0.0–149.0)
VLDL: 41.2 mg/dL — ABNORMAL HIGH (ref 0.0–40.0)

## 2023-11-19 LAB — HEPATIC FUNCTION PANEL
ALT: 13 U/L (ref 0–35)
AST: 18 U/L (ref 0–37)
Albumin: 4.2 g/dL (ref 3.5–5.2)
Alkaline Phosphatase: 50 U/L (ref 39–117)
Bilirubin, Direct: 0.1 mg/dL (ref 0.0–0.3)
Total Bilirubin: 0.3 mg/dL (ref 0.2–1.2)
Total Protein: 7.3 g/dL (ref 6.0–8.3)

## 2023-11-19 LAB — VITAMIN D 25 HYDROXY (VIT D DEFICIENCY, FRACTURES): VITD: 32.21 ng/mL (ref 30.00–100.00)

## 2023-11-20 NOTE — Progress Notes (Unsigned)
 Subjective:    [name], female    DOB: 12-Mar-1946, 78 y.o.   MRN: [id number]  Patient here for No chief complaint on file.   HPI Here for a scheduled follow up - f/u regarding hyperglycemia and hypercholesterolemia.    Past Medical History:  Diagnosis Date  . Blood in the stool    10 yrs ago  . COVID-19 07/01/2021   Runny nose 12/15. (+) test 12/17.  Symptom resolution 07/02/21.  . Diverticulitis   . GERD (gastroesophageal reflux disease)   . History of abnormal Pap smear   . History of chicken pox   . History of colon polyps   . History of kidney stones    h/o   . Hypercholesterolemia   . Hypertension   . Lumbosacral spondylolysis   . Seronegative rheumatoid arthritis [facility])    Past Surgical History:  Procedure Laterality Date  . CATARACT EXTRACTION W/PHACO Right 07/12/2021   Procedure: CATARACT EXTRACTION PHACO AND INTRAOCULAR LENS PLACEMENT (IOC) RIGHT TORIC LENS;  Surgeon: Annell Kidney, MD;  Location: [facility];  Service: Ophthalmology;  Laterality: Right;  7.70 1:08.7  . CATARACT EXTRACTION W/PHACO Left 07/26/2021   Procedure: CATARACT EXTRACTION PHACO AND INTRAOCULAR LENS PLACEMENT (IOC) LEFT TORIC LENS 6.24 01:02.2;  Surgeon: Annell Kidney, MD;  Location: [facility];  Service: Ophthalmology;  Laterality: Left;  . COLONOSCOPY    . DILATION AND CURETTAGE OF UTERUS    . KNEE ARTHROPLASTY Right 05/13/2019   Procedure: COMPUTER ASSISTED TOTAL KNEE ARTHROPLASTY RIGHT;  Surgeon: Arlyne Lame, MD;  Location: [facility];  Service: Orthopedics;  Laterality: Right;  . LEG / ANKLE SOFT TISSUE BIOPSY  2011   to confirm arthritis   Family History  Problem Relation Age of Onset  . Arthritis Father   . Hyperlipidemia Father   . Heart disease Father   . Hypertension Father   . Diabetes Father   . Pulmonary fibrosis Father   . Arthritis Mother   . Heart disease Mother   . Hyperlipidemia Mother   . Hypertension Mother   .  Colon cancer Maternal Aunt   . Colon cancer Maternal Uncle   . Breast cancer Neg Hx    Social History   Socioeconomic History  . Marital status: Divorced    Spouse name: Not on file  . Number of children: Not on file  . Years of education: Not on file  . Highest education level: Not on file  Occupational History  . Not on file  Tobacco Use  . Smoking status: Never  . Smokeless tobacco: Never  Vaping Use  . Vaping status: Never Used  Substance and Sexual Activity  . Alcohol use: Yes    Alcohol/week: 0.0 standard drinks of alcohol    Comment: rare  . Drug use: No  . Sexual activity: Not on file  Other Topics Concern  . Not on file  Social History Narrative  . Not on file   Social Drivers of Health   Financial Resource Strain: Low Risk  (09/06/2021)   Overall Financial Resource Strain (CARDIA)   . Difficulty of Paying Living Expenses: Not hard at all  Food Insecurity: No Food Insecurity (02/22/2021)   Hunger Vital Sign   . Worried About Programme researcher, broadcasting/film/video in the Last Year: Never true   . Ran Out of Food in the Last Year: Never true  Transportation Needs: No Transportation Needs (02/22/2021)   PRAPARE - Transportation   . Lack of  Transportation (Medical): No   . Lack of Transportation (Non-Medical): No  Physical Activity: Sufficiently Active (02/22/2021)   Exercise Vital Sign   . Days of Exercise per Week: 5 days   . Minutes of Exercise per Session: 30 min  Stress: No Stress Concern Present (02/22/2021)   [employer] of Occupational Health - Occupational Stress Questionnaire   . Feeling of Stress : Not at all  Social Connections: Unknown (02/22/2021)   Social Connection and Isolation Panel [NHANES]   . Frequency of Communication with Friends and Family: More than three times a week   . Frequency of Social Gatherings with Friends and Family: More than three times a week   . Attends Religious Services: Not on file   . Active Member of Clubs or Organizations: Not on  file   . Attends Banker Meetings: Not on file   . Marital Status: Not on file     Review of Systems     Objective:     LMP 07/16/1996  Wt Readings from Last 3 Encounters:  11/06/23 157 lb 3.2 oz (71.3 kg)  07/22/23 157 lb 3.2 oz (71.3 kg)  03/22/23 154 lb 12.8 oz (70.2 kg)    Physical Exam  {Perform Simple Foot Exam  Perform Detailed exam:1} {Insert foot Exam (Optional):30965}   Outpatient Encounter Medications as of 11/21/2023  Medication Sig  . aspirin EC 81 MG tablet Take 81 mg by mouth daily.  . calcium  carbonate (TUMS EX) 750 MG chewable tablet Chew 1 tablet by mouth daily.  . diphenhydramine -acetaminophen  (TYLENOL  PM) 25-500 MG TABS tablet Take 1 tablet by mouth at bedtime.  . hydrocortisone -pramoxine (ANALPRAM HC) 2.5-1 % rectal cream Place 1 Application rectally 2 (two) times daily.  . lisinopril  (ZESTRIL ) 10 MG tablet Take 1 tablet (10 mg total) by mouth daily.  . metFORMIN  (GLUCOPHAGE -XR) 500 MG 24 hr tablet TAKE 1 TABLET BY MOUTH TWICE DAILY WITH A MEAL  . Multiple Vitamin (MULTIVITAMIN WITH MINERALS) TABS tablet Take 1 tablet by mouth daily.  . niacin 50 MG tablet Take 50 mg by mouth at bedtime.  . Omega-3 Fatty Acids (FISH OIL) 1000 MG CAPS Take 1 capsule by mouth daily.  . Probiotic Product (PROBIOTIC PO) Take 1 capsule by mouth daily.  . rosuvastatin  (CRESTOR ) 20 MG tablet TAKE 1 TABLET BY MOUTH DAILY.  . traMADol  (ULTRAM ) 50 MG tablet 1-2 po bid prn  . vitamin E  180 MG (400 UNITS) capsule Take 400 Units by mouth daily.   No facility-administered encounter medications on file as of 11/21/2023.     Lab Results  Component Value Date   WBC 5.1 07/18/2023   HGB 13.5 07/18/2023   HCT 41.3 07/18/2023   PLT 448.0 (H) 07/18/2023   GLUCOSE 108 (H) 11/19/2023   CHOL 129 11/19/2023   TRIG 206.0 (H) 11/19/2023   HDL 33.70 (L) 11/19/2023   LDLDIRECT 92.0 04/10/2022   LDLCALC 54 11/19/2023   ALT 13 11/19/2023   AST 18 11/19/2023   NA 140 11/19/2023    K 4.2 11/19/2023   CL 105 11/19/2023   CREATININE 0.83 11/19/2023   BUN 26 (H) 11/19/2023   CO2 25 11/19/2023   TSH 2.22 11/19/2023   INR 0.9 07/15/2019   HGBA1C 6.3 11/19/2023    MM 3D SCREENING MAMMOGRAM BILATERAL BREAST Result Date: 02/12/2023 CLINICAL DATA:  Screening. EXAM: DIGITAL SCREENING BILATERAL MAMMOGRAM WITH TOMOSYNTHESIS AND CAD TECHNIQUE: Bilateral screening digital craniocaudal and mediolateral oblique mammograms were obtained. Bilateral screening digital breast tomosynthesis  was performed. The images were evaluated with computer-aided detection. COMPARISON:  Previous exam(s). ACR Breast Density Category a: The breasts are almost entirely fatty. FINDINGS: There are no findings suspicious for malignancy. IMPRESSION: No mammographic evidence of malignancy. A result letter of this screening mammogram will be mailed directly to the patient. RECOMMENDATION: Screening mammogram in one year. (Code:SM-B-01Y) BI-RADS CATEGORY  1: Negative. Electronically Signed   By: Amanda Jungling M.D.   On: 02/12/2023 08:45       Assessment & Plan:  There are no diagnoses linked to this encounter.   Dellar Fenton, MD

## 2023-11-21 ENCOUNTER — Encounter: Payer: Self-pay | Admitting: Internal Medicine

## 2023-11-21 ENCOUNTER — Ambulatory Visit (INDEPENDENT_AMBULATORY_CARE_PROVIDER_SITE_OTHER): Payer: Medicare HMO | Admitting: Internal Medicine

## 2023-11-21 ENCOUNTER — Other Ambulatory Visit: Payer: Self-pay | Admitting: Internal Medicine

## 2023-11-21 VITALS — BP 118/68 | HR 90 | Temp 98.0°F | Resp 16 | Ht 61.0 in | Wt 155.0 lb

## 2023-11-21 DIAGNOSIS — E785 Hyperlipidemia, unspecified: Secondary | ICD-10-CM

## 2023-11-21 DIAGNOSIS — Z1231 Encounter for screening mammogram for malignant neoplasm of breast: Secondary | ICD-10-CM

## 2023-11-21 DIAGNOSIS — R739 Hyperglycemia, unspecified: Secondary | ICD-10-CM | POA: Diagnosis not present

## 2023-11-21 DIAGNOSIS — I10 Essential (primary) hypertension: Secondary | ICD-10-CM

## 2023-11-21 DIAGNOSIS — D75839 Thrombocytosis, unspecified: Secondary | ICD-10-CM | POA: Diagnosis not present

## 2023-11-21 DIAGNOSIS — M79673 Pain in unspecified foot: Secondary | ICD-10-CM

## 2023-11-21 DIAGNOSIS — K649 Unspecified hemorrhoids: Secondary | ICD-10-CM | POA: Diagnosis not present

## 2023-11-21 MED ORDER — HYDROCORT-PRAMOXINE (PERIANAL) 2.5-1 % EX CREA: 1.0000 | TOPICAL_CREAM | Freq: Two times a day (BID) | CUTANEOUS | 0 refills | Status: DC

## 2023-11-21 NOTE — Assessment & Plan Note (Signed)
 Continue lisinopril .  Blood pressure as outlined.  No changes.  Follow metabolic panel. No changes today.

## 2023-11-21 NOTE — Assessment & Plan Note (Signed)
 Planning for foot surgery. No chest pain or sob reported. No cough or congestion. Blood pressure doing well.

## 2023-11-21 NOTE — Assessment & Plan Note (Signed)
 On crestor .  Low cholesterol diet and exercise.  Follow lipid panel and liver function tests,  no changes today. Triglycerides improved some.  Lab Results  Component Value Date   CHOL 129 11/19/2023   HDL 33.70 (L) 11/19/2023   LDLCALC 54 11/19/2023   LDLDIRECT 92.0 04/10/2022   TRIG 206.0 (H) 11/19/2023   CHOLHDL 4 11/19/2023

## 2023-11-21 NOTE — Assessment & Plan Note (Signed)
 Low carb diet and exercise. Follow met b and A1c.  Not on medication.  Lab Results  Component Value Date   HGBA1C 6.3 11/19/2023

## 2023-11-21 NOTE — Assessment & Plan Note (Addendum)
 Schedule mammogram. Due 01/2024.

## 2023-11-21 NOTE — Patient Instructions (Signed)
Vitamin D3 1000 units per day 

## 2023-11-21 NOTE — Assessment & Plan Note (Signed)
 Slightly elevated platelet count.  Has been stable. Recheck cbc today.

## 2023-11-21 NOTE — Assessment & Plan Note (Signed)
 Request refill of analpram. Denies any bleeding or increased pain. Keep bowels moving.

## 2023-11-22 ENCOUNTER — Other Ambulatory Visit: Payer: Self-pay | Admitting: Internal Medicine

## 2023-11-22 ENCOUNTER — Encounter: Payer: Self-pay | Admitting: Internal Medicine

## 2023-11-22 DIAGNOSIS — D473 Essential (hemorrhagic) thrombocythemia: Secondary | ICD-10-CM

## 2023-11-22 DIAGNOSIS — E785 Hyperlipidemia, unspecified: Secondary | ICD-10-CM

## 2023-11-22 DIAGNOSIS — I10 Essential (primary) hypertension: Secondary | ICD-10-CM

## 2023-11-22 DIAGNOSIS — R739 Hyperglycemia, unspecified: Secondary | ICD-10-CM

## 2023-11-22 LAB — CBC WITH DIFFERENTIAL/PLATELET
Basophils Absolute: 0.1 10*3/uL (ref 0.0–0.1)
Basophils Relative: 1 % (ref 0.0–3.0)
Eosinophils Absolute: 0.2 10*3/uL (ref 0.0–0.7)
Eosinophils Relative: 2.8 % (ref 0.0–5.0)
HCT: 37.5 % (ref 36.0–46.0)
Hemoglobin: 12.5 g/dL (ref 12.0–15.0)
Lymphocytes Relative: 24.4 % (ref 12.0–46.0)
Lymphs Abs: 1.6 10*3/uL (ref 0.7–4.0)
MCHC: 33.3 g/dL (ref 30.0–36.0)
MCV: 91.1 fl (ref 78.0–100.0)
Monocytes Absolute: 0.5 10*3/uL (ref 0.1–1.0)
Monocytes Relative: 7.5 % (ref 3.0–12.0)
Neutro Abs: 4.1 10*3/uL (ref 1.4–7.7)
Neutrophils Relative %: 64.3 % (ref 43.0–77.0)
Platelets: 557 10*3/uL — ABNORMAL HIGH (ref 150.0–400.0)
RBC: 4.12 Mil/uL (ref 3.87–5.11)
RDW: 13.8 % (ref 11.5–15.5)
WBC: 6.4 10*3/uL (ref 4.0–10.5)

## 2023-11-22 NOTE — Progress Notes (Signed)
Orders placed for f/u labs.  

## 2023-11-23 ENCOUNTER — Other Ambulatory Visit: Payer: Self-pay | Admitting: Internal Medicine

## 2023-11-27 ENCOUNTER — Other Ambulatory Visit: Payer: Self-pay

## 2023-11-27 MED ORDER — HYDROCORT-PRAMOXINE (PERIANAL) 2.5-1 % EX CREA: 1.0000 | TOPICAL_CREAM | Freq: Two times a day (BID) | CUTANEOUS | 0 refills | Status: DC

## 2023-11-27 NOTE — Telephone Encounter (Signed)
 Received a fax from pharmacy stating the this rx comes as 120 gram box and would like to know if it would be okay to switch.

## 2023-11-27 NOTE — Telephone Encounter (Signed)
 Yes. I sent in new rx.

## 2023-11-28 NOTE — Telephone Encounter (Signed)
 Pt asked clinic to ignore request in separate encounter, please sign off on rx in this encounter as PA team is unable to resolve RX requests. Thank you

## 2024-02-10 ENCOUNTER — Ambulatory Visit
Admission: RE | Admit: 2024-02-10 | Discharge: 2024-02-10 | Disposition: A | Discharge status: Home / Self Care | Source: Ambulatory Visit | Attending: Internal Medicine | Admitting: Internal Medicine

## 2024-02-10 ENCOUNTER — Encounter

## 2024-02-10 DIAGNOSIS — Z1231 Encounter for screening mammogram for malignant neoplasm of breast: Secondary | ICD-10-CM | POA: Insufficient documentation

## 2024-02-19 ENCOUNTER — Telehealth: Payer: Self-pay | Admitting: Podiatry

## 2024-02-19 NOTE — Telephone Encounter (Signed)
 DOS- 02/28/2024  METATARSAL OSTEOTOMY 2-5 LT- 28308 HAMMERTOE REPAIR LT- 28285 HALLUX MPJ FUSION LT- 28750 BONE GRAFT- 20900 SURGICAL RECONSTRUCTION OF AN ANGULAR TOE DEFORMITY- 71686  [employer] EFFECTIVE DATE- 07/16/2020  DEDUCTIBLE- $0 REMAINING- $0 OOP- $4150 REMAINING- $5938.03 COINSURANCE- 0%/$300 COPAY  PER [employer] WEBSITE, NO PRIOR AUTHORIZATIONS ARE REQUIRED FOR CPT CODES 71691 (4x), 71714, 71249, 20900, AND 71686. DOCUMENTATION ATTACHED TO SURGICAL CONSENT PACKET.

## 2024-02-20 ENCOUNTER — Encounter
Admission: RE | Admit: 2024-02-20 | Discharge: 2024-02-20 | Disposition: A | Discharge status: Home / Self Care | Source: Ambulatory Visit | Attending: Podiatry | Admitting: Podiatry

## 2024-02-20 ENCOUNTER — Other Ambulatory Visit: Payer: Self-pay

## 2024-02-20 VITALS — Ht 61.0 in | Wt 153.0 lb

## 2024-02-20 DIAGNOSIS — Z01810 Encounter for preprocedural cardiovascular examination: Secondary | ICD-10-CM

## 2024-02-20 DIAGNOSIS — D473 Essential (hemorrhagic) thrombocythemia: Secondary | ICD-10-CM

## 2024-02-20 DIAGNOSIS — Z01812 Encounter for preprocedural laboratory examination: Secondary | ICD-10-CM

## 2024-02-20 HISTORY — DX: Thrombocytosis, unspecified: D75.839

## 2024-02-20 HISTORY — DX: Spondylosis without myelopathy or radiculopathy, lumbar region: M47.816

## 2024-02-20 HISTORY — DX: Osteoarthritis of knee, unspecified: M17.9

## 2024-02-20 HISTORY — DX: Essential (primary) hypertension: I10

## 2024-02-20 HISTORY — DX: Hallux valgus (acquired), left foot: M20.12

## 2024-02-20 HISTORY — DX: Prediabetes: R73.03

## 2024-02-20 NOTE — Patient Instructions (Addendum)
 Your procedure is scheduled on: Friday, August 15 Report to the Registration Desk on the 1st floor of the [facility] To find out your arrival time, please call [phone] between 1PM - 3PM on: Thurs. August 14 If your arrival time is 6:00 am, do not arrive before that time as the [facility] entrance doors do not open until 6:00 am.  REMEMBER: Instructions that are not followed completely may result in serious medical risk, up to and including death; or upon the discretion of your surgeon and anesthesiologist your surgery may need to be rescheduled.  Do not eat food after midnight the night before surgery.  No gum chewing or hard candies.  You may however, drink CLEAR liquids up to 2 hours before you are scheduled to arrive for your surgery. Do not drink anything within 2 hours of your scheduled arrival time.  Clear liquids include: - water  - apple juice without pulp - gatorade (not RED colors) - black coffee or tea (Do NOT add milk or creamers to the coffee or tea) Do NOT drink anything that is not on this list.  In addition, your doctor has ordered for you to drink the provided:  Ensure Pre-Surgery Clear Carbohydrate Drink  Drinking this carbohydrate drink up to two hours before surgery helps to reduce insulin resistance and improve patient outcomes. Please complete drinking 2 hours before scheduled arrival time.  One week prior to surgery: starting August 8 Stop aspirin and Anti-inflammatories (NSAIDS) such as Advil, Aleve, Ibuprofen, Motrin, Naproxen, Naprosyn and Aspirin based products such as Excedrin, Goody's Powder, BC Powder. Stop ANY OVER THE COUNTER supplements until after surgery. Stop tums, multiple vitamins, fish oil, probiotic, vitamin E .  You may however, continue to take Tylenol  if needed for pain up until the day of surgery.  Metformin  - hold for 2 days before surgery. Last day to TAKE is Tuesday, August 12. Resume AFTER surgery.  Continue taking all of your  other prescription medications up until the day of surgery.  ON THE DAY OF SURGERY ONLY TAKE THESE MEDICATIONS WITH SIPS OF WATER:  Tramadol  only if needed for pain  No Alcohol for 24 hours before or after surgery.  No Smoking including e-cigarettes for 24 hours before surgery.  No chewable tobacco products for at least 6 hours before surgery.  No nicotine patches on the day of surgery.  Do not use any recreational drugs for at least a week (preferably 2 weeks) before your surgery.  Please be advised that the combination of cocaine and anesthesia may have negative outcomes, up to and including death. If you test positive for cocaine, your surgery will be cancelled.  On the morning of surgery brush your teeth with toothpaste and water, you may rinse your mouth with mouthwash if you wish. Do not swallow any toothpaste or mouthwash.  Use CHG Soap as directed on instruction sheet.  Do not wear jewelry, make-up, hairpins, clips or nail polish.  For welded (permanent) jewelry: bracelets, anklets, waist bands, etc.  Please have this removed prior to surgery.  If it is not removed, there is a chance that hospital personnel will need to cut it off on the day of surgery.  Do not wear lotions, powders, or perfumes.   Do not shave body hair from the neck down 48 hours before surgery.  Contact lenses, hearing aids and dentures may not be worn into surgery.  Do not bring valuables to the hospital. [facility] is not responsible for any missing/lost  belongings or valuables.   Notify your doctor if there is any change in your medical condition (cold, fever, infection).  Wear comfortable clothing (specific to your surgery type) to the hospital.  After surgery, you can help prevent lung complications by doing breathing exercises.  Take deep breaths and cough every 1-2 hours. Your doctor may order a device called an Incentive Spirometer to help you take deep breaths.  If you are being  discharged the day of surgery, you will not be allowed to drive home. You will need a responsible individual to drive you home and stay with you for 24 hours after surgery.   If you are taking public transportation, you will need to have a responsible individual with you.  Please call the Pre-admissions Testing Dept. at [phone] if you have any questions about these instructions.  Surgery Visitation Policy:  Patients having surgery or a procedure may have two visitors.  Children under the age of 46 must have an adult with them who is not the patient.  Merchandiser, retail to address health-related social needs:  [email or web address]       Preparing for Surgery with CHLORHEXIDINE  GLUCONATE (CHG) Soap  Chlorhexidine  Gluconate (CHG) Soap  o An antiseptic cleaner that kills germs and bonds with the skin to continue killing germs even after washing  o Used for showering the night before surgery and morning of surgery  Before surgery, you can play an important role by reducing the number of germs on your skin.  CHG (Chlorhexidine  gluconate) soap is an antiseptic cleanser which kills germs and bonds with the skin to continue killing germs even after washing.  Please do not use if you have an allergy to CHG or antibacterial soaps. If your skin becomes reddened/irritated stop using the CHG.  1. Shower the NIGHT BEFORE SURGERY and the MORNING OF SURGERY with CHG soap.  2. If you choose to wash your hair, wash your hair first as usual with your normal shampoo.  3. After shampooing, rinse your hair and body thoroughly to remove the shampoo.  4. Use CHG as you would any other liquid soap. You can apply CHG directly to the skin and wash gently with a scrungie or a clean washcloth.  5. Apply the CHG soap to your body only from the neck down. Do not use on open wounds or open sores. Avoid contact with your eyes, ears, mouth, and genitals (private parts). Wash face  and genitals (private parts) with your normal soap.  6. Wash thoroughly, paying special attention to the area where your surgery will be performed.  7. Thoroughly rinse your body with warm water.  8. Do not shower/wash with your normal soap after using and rinsing off the CHG soap.  9. Pat yourself dry with a clean towel.  10. Wear clean pajamas to bed the night before surgery.  12. Place clean sheets on your bed the night of your first shower and do not sleep with pets.  13. Shower again with the CHG soap on the day of surgery prior to arriving at the hospital.  14. Do not apply any deodorants/lotions/powders.  15. Please wear clean clothes to the hospital.    How to Use an Incentive Spirometer  An incentive spirometer is a tool that measures how well you are filling your lungs with each breath. Learning to take long, deep breaths using this tool can help you keep your lungs clear and active. This may help to reverse or lessen your chance  of developing breathing (pulmonary) problems, especially infection. You may be asked to use a spirometer: After a surgery. If you have a lung problem or a history of smoking. After a long period of time when you have been unable to move or be active. If the spirometer includes an indicator to show the highest number that you have reached, your health care provider or respiratory therapist will help you set a goal. Keep a log of your progress as told by your health care provider. What are the risks? Breathing too quickly may cause dizziness or cause you to pass out. Take your time so you do not get dizzy or light-headed. If you are in pain, you may need to take pain medicine before doing incentive spirometry. It is harder to take a deep breath if you are having pain. How to use your incentive spirometer  Sit up on the edge of your bed or on a chair. Hold the incentive spirometer so that it is in an upright position. Before you use the spirometer,  breathe out normally. Place the mouthpiece in your mouth. Make sure your lips are closed tightly around it. Breathe in slowly and as deeply as you can through your mouth, causing the piston or the ball to rise toward the top of the chamber. Hold your breath for 3-5 seconds, or for as long as possible. If the spirometer includes a coach indicator, use this to guide you in breathing. Slow down your breathing if the indicator goes above the marked areas. Remove the mouthpiece from your mouth and breathe out normally. The piston or ball will return to the bottom of the chamber. Rest for a few seconds, then repeat the steps 10 or more times. Take your time and take a few normal breaths between deep breaths so that you do not get dizzy or light-headed. Do this every 1-2 hours when you are awake. If the spirometer includes a goal marker to show the highest number you have reached (best effort), use this as a goal to work toward during each repetition. After each set of 10 deep breaths, cough a few times. This will help to make sure that your lungs are clear. If you have an incision on your chest or abdomen from surgery, place a pillow or a rolled-up towel firmly against the incision when you cough. This can help to reduce pain while taking deep breaths and coughing. General tips When you are able to get out of bed: Walk around often. Continue to take deep breaths and cough in order to clear your lungs. Keep using the incentive spirometer until your health care provider says it is okay to stop using it. If you have been in the hospital, you may be told to keep using the spirometer at home. Contact a health care provider if: You are having difficulty using the spirometer. You have trouble using the spirometer as often as instructed. Your pain medicine is not giving enough relief for you to use the spirometer as told. You have a fever. Get help right away if: You develop shortness of breath. You  develop a cough with bloody mucus from the lungs. You have fluid or blood coming from an incision site after you cough. Summary An incentive spirometer is a tool that can help you learn to take long, deep breaths to keep your lungs clear and active. You may be asked to use a spirometer after a surgery, if you have a lung problem or a history of smoking,  or if you have been inactive for a long period of time. Use your incentive spirometer as instructed every 1-2 hours while you are awake. If you have an incision on your chest or abdomen, place a pillow or a rolled-up towel firmly against your incision when you cough. This will help to reduce pain. Get help right away if you have shortness of breath, you cough up bloody mucus, or blood comes from your incision when you cough. This information is not intended to replace advice given to you by your health care provider. Make sure you discuss any questions you have with your health care provider. Document Revised: 09/21/2019 Document Reviewed: 09/21/2019 Elsevier Patient Education  2023 [employer].

## 2024-02-21 ENCOUNTER — Encounter
Admission: RE | Admit: 2024-02-21 | Discharge: 2024-02-21 | Disposition: A | Discharge status: Home / Self Care | Source: Ambulatory Visit | Attending: Podiatry | Admitting: Podiatry

## 2024-02-21 DIAGNOSIS — Z01810 Encounter for preprocedural cardiovascular examination: Secondary | ICD-10-CM | POA: Diagnosis not present

## 2024-02-21 DIAGNOSIS — Z01818 Encounter for other preprocedural examination: Secondary | ICD-10-CM | POA: Diagnosis not present

## 2024-02-21 DIAGNOSIS — Z01812 Encounter for preprocedural laboratory examination: Secondary | ICD-10-CM

## 2024-02-21 DIAGNOSIS — D473 Essential (hemorrhagic) thrombocythemia: Secondary | ICD-10-CM | POA: Insufficient documentation

## 2024-02-21 LAB — BASIC METABOLIC PANEL WITH GFR
Anion gap: 10 (ref 5–15)
BUN: 23 mg/dL (ref 8–23)
CO2: 23 mmol/L (ref 22–32)
Calcium: 9.5 mg/dL (ref 8.9–10.3)
Chloride: 107 mmol/L (ref 98–111)
Creatinine, Ser: 0.89 mg/dL (ref 0.44–1.00)
GFR, Estimated: >60 mL/min (ref >60)
Glucose, Bld: 160 mg/dL — ABNORMAL HIGH (ref 70–99)
Potassium: 3.9 mmol/L (ref 3.5–5.1)
Sodium: 140 mmol/L (ref 135–145)

## 2024-02-21 LAB — CBC
HCT: 39.6 % (ref 36.0–46.0)
Hemoglobin: 13.1 g/dL (ref 12.0–15.0)
MCH: 29.6 pg (ref 26.0–34.0)
MCHC: 33.1 g/dL (ref 30.0–36.0)
MCV: 89.4 fL (ref 80.0–100.0)
Platelets: 552 K/uL — ABNORMAL HIGH (ref 150–400)
RBC: 4.43 MIL/uL (ref 3.87–5.11)
RDW: 13.6 % (ref 11.5–15.5)
WBC: 7 K/uL (ref 4.0–10.5)
nRBC: 0 % (ref 0.0–0.2)

## 2024-02-27 MED ORDER — ORAL CARE MOUTH RINSE: 15.0000 mL | Freq: Once | OROMUCOSAL | Status: AC

## 2024-02-27 MED ORDER — LACTATED RINGERS IV SOLN: INTRAVENOUS | Status: DC

## 2024-02-27 MED ORDER — CEFAZOLIN SODIUM-DEXTROSE 2-4 GM/100ML-% IV SOLN
2.0000 g | INTRAVENOUS | Status: AC
  Administered 2024-02-28: 2 g via INTRAVENOUS

## 2024-02-27 MED ORDER — CHLORHEXIDINE GLUCONATE 0.12 % MT SOLN
15.0000 mL | Freq: Once | OROMUCOSAL | Status: AC
  Administered 2024-02-28: 15 mL via OROMUCOSAL

## 2024-02-28 ENCOUNTER — Ambulatory Visit
Admission: RE | Admit: 2024-02-28 | Discharge: 2024-02-28 | Disposition: A | Discharge status: Home / Self Care | Attending: Podiatry | Admitting: Podiatry

## 2024-02-28 ENCOUNTER — Ambulatory Visit

## 2024-02-28 ENCOUNTER — Encounter
Admission: RE | Disposition: A | Discharge status: Home / Self Care | Payer: Self-pay | Source: Home / Self Care | Attending: Podiatry

## 2024-02-28 ENCOUNTER — Ambulatory Visit: Payer: Self-pay | Admitting: Urgent Care

## 2024-02-28 ENCOUNTER — Other Ambulatory Visit: Payer: Self-pay

## 2024-02-28 ENCOUNTER — Encounter: Payer: Self-pay | Admitting: Podiatry

## 2024-02-28 DIAGNOSIS — K219 Gastro-esophageal reflux disease without esophagitis: Secondary | ICD-10-CM | POA: Insufficient documentation

## 2024-02-28 DIAGNOSIS — M8970 Major osseous defect, unspecified site: Secondary | ICD-10-CM | POA: Diagnosis not present

## 2024-02-28 DIAGNOSIS — M89772 Major osseous defect, left ankle and foot: Secondary | ICD-10-CM | POA: Diagnosis not present

## 2024-02-28 DIAGNOSIS — M21962 Unspecified acquired deformity of left lower leg: Secondary | ICD-10-CM

## 2024-02-28 DIAGNOSIS — M19072 Primary osteoarthritis, left ankle and foot: Secondary | ICD-10-CM | POA: Insufficient documentation

## 2024-02-28 DIAGNOSIS — M216X2 Other acquired deformities of left foot: Secondary | ICD-10-CM | POA: Diagnosis not present

## 2024-02-28 DIAGNOSIS — M10072 Idiopathic gout, left ankle and foot: Secondary | ICD-10-CM | POA: Diagnosis not present

## 2024-02-28 DIAGNOSIS — M21612 Bunion of left foot: Secondary | ICD-10-CM | POA: Insufficient documentation

## 2024-02-28 DIAGNOSIS — S99922A Unspecified injury of left foot, initial encounter: Secondary | ICD-10-CM | POA: Diagnosis not present

## 2024-02-28 DIAGNOSIS — I10 Essential (primary) hypertension: Secondary | ICD-10-CM | POA: Diagnosis not present

## 2024-02-28 DIAGNOSIS — M2042 Other hammer toe(s) (acquired), left foot: Secondary | ICD-10-CM | POA: Diagnosis not present

## 2024-02-28 DIAGNOSIS — M2012 Hallux valgus (acquired), left foot: Secondary | ICD-10-CM | POA: Diagnosis not present

## 2024-02-28 SURGERY — OSTEOTOMY, METATARSAL BONE
Anesthesia: General | Site: Toe | Laterality: Left

## 2024-02-28 MED ORDER — ACETAMINOPHEN 500 MG PO TABS
1000.0000 mg | ORAL_TABLET | Freq: Once | ORAL | Status: AC
  Administered 2024-02-28: 1000 mg via ORAL

## 2024-02-28 MED ORDER — OXYCODONE HCL 5 MG PO TABS: 5.0000 mg | ORAL_TABLET | Freq: Once | ORAL | Status: DC | PRN

## 2024-02-28 MED ORDER — LIDOCAINE HCL (CARDIAC) PF 100 MG/5ML IV SOSY
PREFILLED_SYRINGE | INTRAVENOUS | Status: DC | PRN
  Administered 2024-02-28: 80 mg via INTRAVENOUS

## 2024-02-28 MED ORDER — PROPOFOL 1000 MG/100ML IV EMUL
INTRAVENOUS | Status: AC
  Filled 2024-02-28: qty 200

## 2024-02-28 MED ORDER — BUPIVACAINE HCL (PF) 0.5 % IJ SOLN
INTRAMUSCULAR | Status: DC | PRN
Start: 2024-02-28 — End: 2024-02-28
  Administered 2024-02-28: 20 mL

## 2024-02-28 MED ORDER — DROPERIDOL 2.5 MG/ML IJ SOLN: 0.6250 mg | Freq: Once | INTRAMUSCULAR | Status: DC | PRN

## 2024-02-28 MED ORDER — OXYCODONE HCL 5 MG/5ML PO SOLN: 5.0000 mg | Freq: Once | ORAL | Status: DC | PRN

## 2024-02-28 MED ORDER — FENTANYL CITRATE (PF) 100 MCG/2ML IJ SOLN
INTRAMUSCULAR | Status: AC
  Filled 2024-02-28: qty 2

## 2024-02-28 MED ORDER — GABAPENTIN 300 MG PO CAPS: 300.0000 mg | ORAL_CAPSULE | Freq: Three times a day (TID) | ORAL | 0 refills | Status: DC

## 2024-02-28 MED ORDER — OXYCODONE HCL 5 MG PO TABS: 5.0000 mg | ORAL_TABLET | ORAL | 0 refills | Status: AC | PRN

## 2024-02-28 MED ORDER — PROPOFOL 10 MG/ML IV BOLUS: INTRAVENOUS | Status: DC | PRN

## 2024-02-28 MED ORDER — PHENYLEPHRINE 80 MCG/ML (10ML) SYRINGE FOR IV PUSH (FOR BLOOD PRESSURE SUPPORT)
PREFILLED_SYRINGE | INTRAVENOUS | Status: AC
Start: 2024-02-28 — End: 2024-02-28
  Filled 2024-02-28: qty 10

## 2024-02-28 MED ORDER — ACETAMINOPHEN 10 MG/ML IV SOLN
1000.0000 mg | Freq: Once | INTRAVENOUS | Status: DC | PRN
Start: 2024-02-28 — End: 2024-02-28

## 2024-02-28 MED ORDER — ASPIRIN 325 MG PO TBEC: 325.0000 mg | DELAYED_RELEASE_TABLET | Freq: Two times a day (BID) | ORAL | 0 refills | Status: AC

## 2024-02-28 MED ORDER — PROPOFOL 1000 MG/100ML IV EMUL
INTRAVENOUS | Status: AC
  Filled 2024-02-28: qty 100

## 2024-02-28 MED ORDER — SODIUM CHLORIDE 0.9 % IR SOLN
Status: DC | PRN
  Administered 2024-02-28: 1000 mL

## 2024-02-28 MED ORDER — CELECOXIB 200 MG PO CAPS
ORAL_CAPSULE | ORAL | Status: AC
  Filled 2024-02-28: qty 1

## 2024-02-28 MED ORDER — PHENYLEPHRINE 80 MCG/ML (10ML) SYRINGE FOR IV PUSH (FOR BLOOD PRESSURE SUPPORT)
PREFILLED_SYRINGE | INTRAVENOUS | Status: DC | PRN
  Administered 2024-02-28 (×3): 160 ug via INTRAVENOUS
  Administered 2024-02-28: 80 ug via INTRAVENOUS
  Administered 2024-02-28: 160 ug via INTRAVENOUS

## 2024-02-28 MED ORDER — KETOROLAC TROMETHAMINE 30 MG/ML IJ SOLN
INTRAMUSCULAR | Status: AC
  Filled 2024-02-28: qty 1

## 2024-02-28 MED ORDER — MIDAZOLAM HCL 2 MG/2ML IJ SOLN
INTRAMUSCULAR | Status: DC | PRN
Start: 2024-02-28 — End: 2024-02-28
  Administered 2024-02-28 (×2): 1 mg via INTRAVENOUS

## 2024-02-28 MED ORDER — EPHEDRINE 5 MG/ML INJ
INTRAVENOUS | Status: AC
Start: 2024-02-28 — End: 2024-02-28
  Filled 2024-02-28: qty 5

## 2024-02-28 MED ORDER — BUPIVACAINE HCL (PF) 0.5 % IJ SOLN
INTRAMUSCULAR | Status: AC
Start: 2024-02-28 — End: 2024-02-28
  Filled 2024-02-28: qty 30

## 2024-02-28 MED ORDER — ACETAMINOPHEN 500 MG PO TABS: 1000.0000 mg | ORAL_TABLET | Freq: Four times a day (QID) | ORAL | 0 refills | Status: AC | PRN

## 2024-02-28 MED ORDER — FENTANYL CITRATE (PF) 100 MCG/2ML IJ SOLN
INTRAMUSCULAR | Status: DC | PRN
  Administered 2024-02-28 (×2): 50 ug via INTRAVENOUS

## 2024-02-28 MED ORDER — PROPOFOL 500 MG/50ML IV EMUL
INTRAVENOUS | Status: DC | PRN
  Administered 2024-02-28: 100 ug/kg/min via INTRAVENOUS

## 2024-02-28 MED ORDER — CELECOXIB 200 MG PO CAPS
200.0000 mg | ORAL_CAPSULE | Freq: Once | ORAL | Status: AC
  Administered 2024-02-28: 200 mg via ORAL

## 2024-02-28 MED ORDER — CHLORHEXIDINE GLUCONATE 0.12 % MT SOLN
OROMUCOSAL | Status: AC
  Filled 2024-02-28: qty 15

## 2024-02-28 MED ORDER — CEFAZOLIN SODIUM-DEXTROSE 2-4 GM/100ML-% IV SOLN
INTRAVENOUS | Status: AC
  Filled 2024-02-28: qty 100

## 2024-02-28 MED ORDER — FENTANYL CITRATE (PF) 100 MCG/2ML IJ SOLN: 25.0000 ug | INTRAMUSCULAR | Status: DC | PRN

## 2024-02-28 MED ORDER — BUPIVACAINE LIPOSOME 1.3 % IJ SUSP
INTRAMUSCULAR | Status: DC | PRN
Start: 2024-02-28 — End: 2024-02-28
  Administered 2024-02-28: 10 mL

## 2024-02-28 MED ORDER — DEXMEDETOMIDINE HCL IN NACL 80 MCG/20ML IV SOLN
INTRAVENOUS | Status: DC | PRN
Start: 2024-02-28 — End: 2024-02-28
  Administered 2024-02-28: 8 ug via INTRAVENOUS

## 2024-02-28 MED ORDER — ACETAMINOPHEN 500 MG PO TABS
ORAL_TABLET | ORAL | Status: AC
Start: 2024-02-28 — End: 2024-02-28
  Filled 2024-02-28: qty 2

## 2024-02-28 MED ORDER — MIDAZOLAM HCL 2 MG/2ML IJ SOLN
INTRAMUSCULAR | Status: AC
  Filled 2024-02-28: qty 2

## 2024-02-28 MED ORDER — BUPIVACAINE LIPOSOME 1.3 % IJ SUSP
INTRAMUSCULAR | Status: AC
Start: 2024-02-28 — End: 2024-02-28
  Filled 2024-02-28: qty 20

## 2024-02-28 SURGICAL SUPPLY — 59 items
BIT DRILL 2 STRT CANN (BIT) IMPLANT
BIT DRILL 2.2 CANN STRGHT (BIT) IMPLANT
BIT DRILL CANN F/COMP 2.2 (BIT) IMPLANT
BIT DRILL CANN QF 1.7 (BIT) IMPLANT
BLADE OSC/SAGITTAL MD 5.5X18 (BLADE) IMPLANT
BLADE SURG 15 STRL LF DISP TIS (BLADE) ×6 IMPLANT
BLADE SW THK.38XMED LNG THN (BLADE) IMPLANT
BNDG COHESIVE 4X5 TAN STRL LF (GAUZE/BANDAGES/DRESSINGS) ×3 IMPLANT
BNDG ELASTIC 4X5.8 VLCR NS LF (GAUZE/BANDAGES/DRESSINGS) IMPLANT
BNDG ESMARCH 4X12 STRL LF (GAUZE/BANDAGES/DRESSINGS) ×3 IMPLANT
BNDG GAUZE DERMACEA FLUFF 4 (GAUZE/BANDAGES/DRESSINGS) IMPLANT
BNDG STRETCH GAUZE 3IN X12FT (GAUZE/BANDAGES/DRESSINGS) ×3 IMPLANT
CHLORAPREP W/TINT 26 (MISCELLANEOUS) ×3 IMPLANT
COVER PIN YLW 0.028-062 (MISCELLANEOUS) IMPLANT
CUFF TOURN SGL QUICK 18X4 (TOURNIQUET CUFF) ×3 IMPLANT
DRAPE FLUOR MINI C-ARM 54X84 (DRAPES) ×3 IMPLANT
DRSG EMULSION OIL 3X3 NADH (GAUZE/BANDAGES/DRESSINGS) ×3 IMPLANT
ELECTRODE REM PT RTRN 9FT ADLT (ELECTROSURGICAL) ×3 IMPLANT
GAUZE PAD ABD 8X10 STRL (GAUZE/BANDAGES/DRESSINGS) ×3 IMPLANT
GAUZE SPONGE 4X4 12PLY STRL (GAUZE/BANDAGES/DRESSINGS) ×3 IMPLANT
GAUZE XEROFORM 1X8 LF (GAUZE/BANDAGES/DRESSINGS) IMPLANT
GLOVE PI ORTHO PRO STRL 7.5 (GLOVE) ×3 IMPLANT
GOWN STRL REUS W/ TWL LRG LVL3 (GOWN DISPOSABLE) ×3 IMPLANT
GUIDEWIRE .045XTROC TIP LSR LN (WIRE) IMPLANT
GUIDEWIRE 0.86MM (WIRE) IMPLANT
HARVESTER BONE GRAFT 6 (INSTRUMENTS) IMPLANT
KIT PREVENA INCISION MGT 13 (CANNISTER) IMPLANT
KIT TURNOVER KIT A (KITS) ×3 IMPLANT
KWIRE W/LASER LINE SS QF .86 (WIRE) IMPLANT
NS IRRIG 1000ML POUR BTL (IV SOLUTION) IMPLANT
PACK EXTREMITY ARMC (MISCELLANEOUS) ×3 IMPLANT
PAD ABD DERMACEA PRESS 5X9 (GAUZE/BANDAGES/DRESSINGS) IMPLANT
PAD PREP OB/GYN DISP 24X41 (PERSONAL CARE ITEMS) ×3 IMPLANT
PENCIL SMOKE EVACUATOR (MISCELLANEOUS) ×6 IMPLANT
PIN BB TAK MTP SU (PIN) IMPLANT
PLATE MTP PETITE ANGLE LT (Plate) IMPLANT
PROBE BIOSP ALOKA ALPHA6 PROST (MISCELLANEOUS) IMPLANT
Patite MTP plate, left ×3 IMPLANT
REAMER METATARSAL 18MM (MISCELLANEOUS) IMPLANT
REAMER PHALANGEAL 18MM (MISCELLANEOUS) IMPLANT
SCREW COMPR FT QF 3.5X24 (Screw) IMPLANT
SCREW COMPRESSION 2.5X24 (Screw) IMPLANT
SCREW COMPRESSION 2.5X25 (Screw) ×3 IMPLANT
SCREW CORTICAL 3.0X16 (Screw) IMPLANT
SCREW LOCK COMP 3X16 (Screw) IMPLANT
SCREW NLOCK QF ST 2.4X12 (Screw) IMPLANT
SCREW VAL KREULOCK 3.0X14 TI (Screw) IMPLANT
SCREW VAL KREULOCK 3.0X18 TI (Screw) IMPLANT
SCREW VAL KREULOCK 3X20 (Screw) IMPLANT
SLEEVE SCD COMPRESS KNEE MED (STOCKING) ×3 IMPLANT
STAPLER SKIN PROX 35W (STAPLE) ×3 IMPLANT
STOCKINETTE ORTHO 6X25 (MISCELLANEOUS) ×3 IMPLANT
SURGILUBE 2OZ TUBE FLIPTOP (MISCELLANEOUS) ×3 IMPLANT
SUT MERSILENE 4-0 WHT RB-1 (SUTURE) IMPLANT
SUT MNCRL AB 3-0 PS2 27 (SUTURE) ×3 IMPLANT
SUT STRATA 4-0 30 PS-2 (SUTURE) IMPLANT
SUTURE ETHLN 4-0 FS2 18XMF BLK (SUTURE) IMPLANT
SYR CONTROL 10ML LL (SYRINGE) ×3 IMPLANT
WIRE Z .062 C-WIRE SPADE TIP (WIRE) IMPLANT

## 2024-02-28 NOTE — Transfer of Care (Signed)
 Immediate Anesthesia Transfer of Care Note  Patient: Caitlin Bennett  Procedure(s) Performed: OSTEOTOMY, METATARSAL BONE (Left: Toe) CORRECTION, HAMMER TOE (Left: Toe) FUSION, JOINT, GREAT TOE (Left) PROCEDURE, BONE GRAFT (Left: Foot) RECONSTRUCTION, TOE, FOR ANGULAR DEFORMITY (Left: Toe)  Patient Location: PACU  Anesthesia Type:General  Level of Consciousness: awake  Airway & Oxygen Therapy: Patient Spontanous Breathing  Post-op Assessment: Report given to RN  Post vital signs: stable  Last Vitals:  Vitals Value Taken Time  BP 110/71 02/28/24 14:52  Temp    Pulse 75 02/28/24 14:58  Resp 19 02/28/24 14:58  SpO2 97 % 02/28/24 14:58  Vitals shown include unfiled device data.  Last Pain:  Vitals:   02/28/24 0924  TempSrc: Oral  PainSc: 3       Patients Stated Pain Goal: 0 (02/28/24 9075)  Complications: There were no known notable events for this encounter.

## 2024-02-28 NOTE — Discharge Instructions (Addendum)
 Post-Surgery Instructions  1. If you are recuperating from surgery anywhere other than home, please be sure to leave us  a number where you can be reached. 2. Go directly home and rest. 3. The keep operated foot (or feet) elevated six inches above the hip when sitting or lying down. 4. Support the elevated foot and leg with pillows under the calf. DO NOT PLACE PILLOWS UNDER THE KNEE. 5. DO NOT REMOVE or get your bandages wet. This will increase your chances of getting an infection. 6. Wear your surgical boot at all times when you are up. Do not walk on it until instructed 7. A limited amount of pain and swelling may occur. The skin may take on a bruised appearance. This is no cause for alarm. 8. For slight pain and swelling, apply an ice pack behind the knee for 15 minutes every hour. Continue icing until seen in the office. DO NOT apply any form of heat to the area. 9. Have prescription(s) filled immediately and take as directed. 10. Drink lots of liquids, water, and juice. 11. CALL THE OFFICE IMMEDIATELY IF: a. Bleeding continues b. Pain increases and/or does not respond to medication c. Bandage or cast appears too tight d. Any liquids (water, coffee, etc.) have spilled on your bandages. e. Tripping, falling, or stubbing the surgical foot f. If your temperature rises above 101 g. If you have ANY questions at all 12. Please use the crutches, knee scooter, or walker you have prescribed, rented, or purchased. If you are non-weight bearing DO NOT put weight on the operated foot for _________ days. If you are weight-bearing, follow your physician's instructions. You are expected to be:  ? non-weight bearing 13. Special Instructions: _____________________________________________________________ _________________________________________________________________________________ _________________________________________________________________________________  14. Your next appointment is:   03/04/2024 1:15 PM     If you need to reach the nurse for any reason, please call: [place]: [phone] [place]: [phone] [place]: [phone]

## 2024-02-28 NOTE — Anesthesia Preprocedure Evaluation (Addendum)
 Anesthesia Evaluation  Patient identified by MRN, date of birth, ID band Patient awake    Reviewed: Allergy & Precautions, H&P , NPO status , Patient's Chart, lab work & pertinent test results  Airway Mallampati: II  TM Distance: >3 FB Neck ROM: full    Dental no notable dental hx.    Pulmonary neg pulmonary ROS   Pulmonary exam normal        Cardiovascular hypertension, Normal cardiovascular exam     Neuro/Psych negative neurological ROS  negative psych ROS   GI/Hepatic Neg liver ROS,GERD  Controlled,,  Endo/Other  negative endocrine ROS    Renal/GU      Musculoskeletal  (+) Arthritis ,    Abdominal Normal abdominal exam  (+)   Peds  Hematology negative hematology ROS (+)   Anesthesia Other Findings Past Medical History: No date: Blood in the stool     Comment:  10 yrs ago 07/01/2021: COVID-19     Comment:  Runny nose 12/15. (+) test 12/17.  Symptom resolution               07/02/21. No date: Diverticulitis No date: GERD (gastroesophageal reflux disease) No date: Hallux valgus with bunions, left No date: History of abnormal Pap smear No date: History of chicken pox No date: History of colon polyps No date: History of kidney stones No date: Hypercholesterolemia No date: Lumbosacral spondylolysis No date: Osteoarthritis, knee No date: Pre-diabetes No date: Primary hypertension No date: Seronegative rheumatoid arthritis (HCC) No date: Spondylosis of lumbar region without myelopathy or  radiculopathy No date: Thrombocytosis  Past Surgical History: 07/12/2021: CATARACT EXTRACTION W/PHACO; Right     Comment:  Procedure: CATARACT EXTRACTION PHACO AND INTRAOCULAR               LENS PLACEMENT (IOC) RIGHT TORIC LENS;  Surgeon:               Mittie Gaskin, MD;  Location: [facility];              Service: Ophthalmology;  Laterality: Right;  7.70 1:08.7 07/26/2021: CATARACT EXTRACTION W/PHACO;  Left     Comment:  Procedure: CATARACT EXTRACTION PHACO AND INTRAOCULAR               LENS PLACEMENT (IOC) LEFT TORIC LENS 6.24 01:02.2;                Surgeon: Mittie Gaskin, MD;  Location: [facility];  Service: Ophthalmology;  Laterality: Left; No date: COLONOSCOPY No date: DILATION AND CURETTAGE OF UTERUS 2019: JOINT REPLACEMENT     Comment:  knee replacement 05/13/2019: KNEE ARTHROPLASTY; Right     Comment:  Procedure: COMPUTER ASSISTED TOTAL KNEE ARTHROPLASTY               RIGHT;  Surgeon: Mardee Lynwood SQUIBB, MD;  Location: [facility]               ORS;  Service: Orthopedics;  Laterality: Right; 2011: LEG / ANKLE SOFT TISSUE BIOPSY; Right     Comment:  to confirm arthritis     Reproductive/Obstetrics negative OB ROS                              Anesthesia Physical Anesthesia Plan  ASA: 2  Anesthesia Plan: General LMA   Post-op Pain Management: Regional block*, Tylenol  PO (pre-op)* and Celebrex   PO (pre-op)*   Induction: Intravenous  PONV Risk Score and Plan: Dexamethasone , Ondansetron  and Midazolam   Airway Management Planned: LMA  Additional Equipment:   Intra-op Plan:   Post-operative Plan: Extubation in OR  Informed Consent: I have reviewed the patients History and Physical, chart, labs and discussed the procedure including the risks, benefits and alternatives for the proposed anesthesia with the patient or authorized representative who has indicated his/her understanding and acceptance.     Dental Advisory Given  Plan Discussed with: Anesthesiologist, CRNA and Surgeon  Anesthesia Plan Comments:          Anesthesia Quick Evaluation

## 2024-02-28 NOTE — Anesthesia Procedure Notes (Addendum)
 Procedure Name: LMA Insertion Date/Time: 02/28/2024 11:54 AM  Performed by: Skylene Deremer R, CRNAPre-anesthesia Checklist: Patient identified, Emergency Drugs available, Suction available, Patient being monitored and Timeout performed Patient Re-evaluated:Patient Re-evaluated prior to induction Oxygen Delivery Method: Circle system utilized Preoxygenation: Pre-oxygenation with 100% oxygen Induction Type: IV induction LMA: LMA inserted LMA Size: 3.0 Number of attempts: 1 Placement Confirmation: positive ETCO2 and breath sounds checked- equal and bilateral Tube secured with: Tape Dental Injury: Teeth and Oropharynx as per pre-operative assessment

## 2024-02-28 NOTE — Op Note (Addendum)
 Patient Name: Caitlin Bennett DOB: 25-Oct-1945  MRN: [id number]   Date of Service: 02/28/2024  Surgeon: Dr. Juliene Medicine, DPM Assistants: None Pre-operative Diagnosis:  Hammer toe of left foot Hallux valgus with bunions, left Metatarsal deformity, left Post-operative Diagnosis:  Hammer toe of left foot Hallux valgus with bunions  leftMetatarsal deformity Major osseous defect  Procedures: Left first MTP fusion Second metatarsal osteotomy left Hammertoe correction left second FDL transfer 2nd toe Bone graft minor left calcaneus Pathology/Specimens: * No specimens in log * Anesthesia: General With ankle block Hemostasis:  Total Tourniquet Time Documented: Calf (Left) - 85 minutes Total: Calf (Left) - 85 minutes  Estimated Blood Loss: 10 cc Materials:  Implant Name Type Inv. Item Serial No. Manufacturer Lot No. LRB No. Used Action  Patite MTP plate, left      Left 1 Implanted  SCREW VAL KREULOCK 3.0X18 TI - [id number] Screw SCREW VAL KREULOCK 3.0X18 TI  [employer]  Left 1 Implanted  SCREW LOCK COMP 3X16 - [id number] Screw SCREW LOCK COMP 3X16  [employer]  Left 2 Implanted  SCREW VAL KREULOCK 3.0X14 TI - [id number] Screw SCREW VAL KREULOCK 3.0X14 TI  [employer]  Left 1 Implanted  SCREW CORTICAL 3.0X16 - [id number] Screw SCREW CORTICAL 3.0X16  [employer]  Left 1 Implanted  SCREW VAL KREULOCK 3X20 - [id number] Screw SCREW VAL KREULOCK 3X20  [employer]  Left 1 Implanted  SCREW COMPR FT QF 3.5X24 - [id number] Screw SCREW COMPR FT QF 3.5X24  [employer]  Left 1 Implanted  SCREW NLOCK QF ST 2.4X12 - [id number] Screw SCREW NLOCK QF ST 2.4X12  [employer]  Left 1 Implanted  SCREW COMPRESSION 2.5X25 - [id number] Screw SCREW COMPRESSION 2.5X25  [employer]  Left 1 Implanted   Medications: 20 cc 0.5% bupivacaine  plain mixed with 133 mg of Exparel  Complications: None  Indications for Procedure:  This is a 78 y.o. female with a history of painful left foot bunion deformity arthritis and  second toe deformity.  After failing nonoperative treatment she elected for operative intervention.  All questions addressed prior to surgery.   Procedure in Detail: Patient was identified in pre-operative holding area. Formal consent was signed and the left lower extremity was marked. Patient was brought back to the operating room. Anesthesia was induced. The extremity was prepped and draped in the usual sterile fashion. Timeout was taken to confirm patient name, laterality, and procedure prior to incision.   Attention was then directed to the left foot where a longitudinal incision was made just medial to the extensor hallucis longus tendon.  This carried through subcutaneous tissues all bleeders were cauterized as necessary.  Neurovascular bundles and extensor tendon were safely retracted and dissection was carried down to the capsule of the first metatarsal phalangeal joint which was incised and a McGlamry elevator was used to expose the metatarsal head and base of the proximal phalanx.  Soft tissue attachments were freed from the base of the phalanx.  Osteoarthritis of both surfaces was noted as well as gouty arthritic changes with crystalline formation.  A guidewire for the reamer was placed over the metatarsal head and the reamer was used to resect the articular cartilage and some of the subchondral bone plate.  The same was done with the cone reamer using a guidewire on the base of the proximal phalanx.  The prominent medial eminence of the first metatarsal head was then resected using a sagittal saw.  All bony and soft tissue debris was then thoroughly  irrigated from the wound.  A 2.5 mm drill bit was used to fenestrate the head of the first metatarsal and base of the proximal phalanx.  I then made an incision of the lateral heel and blunt dissection was used to expose the lateral wall the calcaneus.  A bone graft reamer was used to obtain 1 to 2 cc of autogenous cancellous bone graft.  This was placed  into the first metatarsal phalangeal joint arthrodesis site.  The hallux was then placed into a corrected position using fluoroscopic guidance.  Once satisfactory position had been achieved in the transverse plane it was slightly dorsiflexed using a plate to simulate weightbearing and a folded 4 x 4 sponge.  A guidewire for the 3.5 millimeter screw was then placed.  I then directed my attention dorsally and selected a plate and put it in temporary position using plate tacks.  Locking screws were placed in the distal holes with good stability of the construct.  Once satisfactory position was achieved locking and nonlocking screws were placed in the proximal screw holes, the non locking screw was used in the compression slot to achieve further compression.   The 3.5 mm FT screw was placed with good compression and stability across the fusion site.  Attention was then directed to the left second digit where a linear incision was made from the metatarsal neck to the distal middle phalanx.  This was carried deep to expose the extensor digitorum longus tendon and a longitudinal Z-lengthening tenotomy was made.  Dissection was carried proximal and dorsal capsulotomy at the MTP joint was made.  The joint was evaluated with attenuation and intact plantar plate.  A metatarsal Weil style osteotomy was then made with a sagittal saw.  The capital fragment was translated proximally and it was temporarily fixated with guidewires for 2.4 millimeter screw.  The screw was drilled measured and inserted with good compression of the osteotomy noted.  This alleviated much of the contracture at the MTP joint in the sagittal plane, some remained in the transverse plane.  The medial collateral ligament was released and the lateral collateral ligament was imbricated and repaired with 4-0 Mersilene.  This corrected much of the deformity and then I directed my attention distally to the proximal phalangeal joint. The PIPJ was exposed and the  collateral ligaments were released.  The articular surface of the head of the proximal phalanx and the base of the middle phalanx was then resected using a sagittal saw and rongeur.  I then harvested the long flexor tendon through this site, split and directed its factor dorsally and proximally.  This was sewn to the dorsal proximal phalanx with 4-0 Mersilene.  Initially I selected a 0.062 inch Kirschner wire, this was then driven retrograde to the level of the base of the proximal phalanx and across the MTP joint into the metatarsal.  However after deflating the tourniquet there was some ischemia to the toe and I elected to remove the wire which resolved the vasospasm.  I then placed a guidewire for a 2.5 millimeter screw, the screw was measured drilled and inserted through the middle and proximal phalanx across the PIPJ.  Good compression of the fusion site was noted clinically and fluoroscopically with adequate reduction of the deformity.  Final films were taken and the incision was thoroughly irrigated, closure was initiated in layers using 3-0 Monocryl and 3-0 nylon. The foot was then dressed with Monocryl and dry sterile dressings. Patient tolerated the procedure well.   Disposition:  Following a period of post-operative monitoring, patient will be transferred to home.

## 2024-02-28 NOTE — H&P (Addendum)
 PREOPERATIVE H&P  Chief Complaint: painful left foot bunion and hammertoe  HPI: Caitlin Bennett is a 78 y.o. female who presents for evaluation of left foot deformities. It has been present for years and has been worsening. She has had unsuccessful non operative treatment and presents for surgery today. Pain is rated as mild. She reports no changes to their PMH or medications.  Past Medical History:  Diagnosis Date   Blood in the stool    10 yrs ago   COVID-19 07/01/2021   Runny nose 12/15. (+) test 12/17.  Symptom resolution 07/02/21.   Diverticulitis    GERD (gastroesophageal reflux disease)    Hallux valgus with bunions, left    History of abnormal Pap smear    History of chicken pox    History of colon polyps    History of kidney stones    Hypercholesterolemia    Lumbosacral spondylolysis    Osteoarthritis, knee    Pre-diabetes    Primary hypertension    Seronegative rheumatoid arthritis (HCC)    Spondylosis of lumbar region without myelopathy or radiculopathy    Thrombocytosis    Past Surgical History:  Procedure Laterality Date   CATARACT EXTRACTION W/PHACO Right 07/12/2021   Procedure: CATARACT EXTRACTION PHACO AND INTRAOCULAR LENS PLACEMENT (IOC) RIGHT TORIC LENS;  Surgeon: Mittie Gaskin, MD;  Location: [facility];  Service: Ophthalmology;  Laterality: Right;  7.70 1:08.7   CATARACT EXTRACTION W/PHACO Left 07/26/2021   Procedure: CATARACT EXTRACTION PHACO AND INTRAOCULAR LENS PLACEMENT (IOC) LEFT TORIC LENS 6.24 01:02.2;  Surgeon: Mittie Gaskin, MD;  Location: [facility];  Service: Ophthalmology;  Laterality: Left;   COLONOSCOPY     DILATION AND CURETTAGE OF UTERUS     HALLUX FUSION Left 02/28/2024   Procedure: FUSION, JOINT, GREAT TOE;  Surgeon: Silva Juliene SAUNDERS, DPM;  Location: [facility];  Service: Orthopedics/Podiatry;  Laterality: Left;  HALUX MPJ FUSION   HAMMER TOE SURGERY Left 02/28/2024   Procedure: CORRECTION, HAMMER TOE;  Surgeon:  Silva Juliene SAUNDERS, DPM;  Location: [facility];  Service: Orthopedics/Podiatry;  Laterality: Left;   HARVEST BONE GRAFT Left 02/28/2024   Procedure: PROCEDURE, BONE GRAFT;  Surgeon: Silva Juliene SAUNDERS, DPM;  Location: [facility];  Service: Orthopedics/Podiatry;  Laterality: Left;   JOINT REPLACEMENT  2019   knee replacement   KNEE ARTHROPLASTY Right 05/13/2019   Procedure: COMPUTER ASSISTED TOTAL KNEE ARTHROPLASTY RIGHT;  Surgeon: Mardee Lynwood SQUIBB, MD;  Location: [facility];  Service: Orthopedics;  Laterality: Right;   LEG / ANKLE SOFT TISSUE BIOPSY Right 2011   to confirm arthritis   METATARSAL OSTEOTOMY Left 02/28/2024   Procedure: OSTEOTOMY, METATARSAL BONE;  Surgeon: Silva Juliene SAUNDERS, DPM;  Location: [facility];  Service: Orthopedics/Podiatry;  Laterality: Left;  BLOCK OR EXPAREL    RECONSTRUCTION OF ANGULAR DEFORMITY,TOE Left 02/28/2024   Procedure: RECONSTRUCTION, TOE, FOR ANGULAR DEFORMITY;  Surgeon: Silva Juliene SAUNDERS, DPM;  Location: [facility];  Service: Orthopedics/Podiatry;  Laterality: Left;   Social History   Socioeconomic History   Marital status: Divorced    Spouse name: Not on file   Number of children: 1   Years of education: Not on file   Highest education level: Not on file  Occupational History   Not on file  Tobacco Use   Smoking status: Never   Smokeless tobacco: Never  Vaping Use   Vaping status: Never Used  Substance and Sexual Activity   Alcohol use: Yes    Alcohol/week: 0.0 standard drinks of alcohol  Comment: rare   Drug use: No   Sexual activity: Not on file  Other Topics Concern   Not on file  Social History Narrative   Not on file   Social Drivers of Health   Financial Resource Strain: Low Risk  (09/06/2021)   Overall Financial Resource Strain (CARDIA)    Difficulty of Paying Living Expenses: Not hard at all  Food Insecurity: No Food Insecurity (02/22/2021)   Hunger Vital Sign    Worried About Running Out of Food in the Last Year: Never true    Ran Out of  Food in the Last Year: Never true  Transportation Needs: No Transportation Needs (02/22/2021)   PRAPARE - Administrator, Civil Service (Medical): No    Lack of Transportation (Non-Medical): No  Physical Activity: Sufficiently Active (02/22/2021)   Exercise Vital Sign    Days of Exercise per Week: 5 days    Minutes of Exercise per Session: 30 min  Stress: No Stress Concern Present (02/22/2021)   [employer] of Occupational Health - Occupational Stress Questionnaire    Feeling of Stress : Not at all  Social Connections: Unknown (02/22/2021)   Social Connection and Isolation Panel    Frequency of Communication with Friends and Family: More than three times a week    Frequency of Social Gatherings with Friends and Family: More than three times a week    Attends Religious Services: Not on Marketing executive or Organizations: Not on file    Attends Banker Meetings: Not on file    Marital Status: Not on file   Family History  Problem Relation Age of Onset   Arthritis Father    Hyperlipidemia Father    Heart disease Father    Hypertension Father    Diabetes Father    Pulmonary fibrosis Father    Arthritis Mother    Heart disease Mother    Hyperlipidemia Mother    Hypertension Mother    COPD Mother    Colon cancer Maternal Aunt    Colon cancer Maternal Uncle    Breast cancer Neg Hx    Allergies  Allergen Reactions   Sumycin [Tetracycline Hcl] Rash   Prior to Admission medications   Medication Sig Start Date End Date Taking? Authorizing Provider  acetaminophen  (TYLENOL ) 650 MG CR tablet Take 650 mg by mouth in the morning and at bedtime.   Yes [provider]  aspirin  EC 325 MG tablet Take 1 tablet (325 mg total) by mouth in the morning and at bedtime. 02/28/24 03/29/24 Yes Aarion Metzgar, Juliene SAUNDERS, DPM  aspirin  EC 81 MG tablet Take 81 mg by mouth daily.   Yes [provider]  calcium  carbonate (TUMS EX) 750 MG chewable tablet Chew  1 tablet by mouth daily.   Yes [provider]  diphenhydramine -acetaminophen  (TYLENOL  PM) 25-500 MG TABS tablet Take 1 tablet by mouth at bedtime.   Yes [provider]  hydrocortisone -pramoxine (ANALPRAM-HC) 2.5-1 % rectal cream PLACE 1 APPLICATION RECTALLY 2 (TWO) TIMES DAILY. Patient taking differently: Place 1 Application rectally daily as needed for hemorrhoids or anal itching. 11/29/23  Yes Glendia Shad, MD  lisinopril  (ZESTRIL ) 10 MG tablet TAKE 1 TABLET DAILY 11/25/23  Yes Glendia Shad, MD  Multiple Vitamin (MULTIVITAMIN WITH MINERALS) TABS tablet Take 1 tablet by mouth daily.   Yes [provider]  Omega-3 Fatty Acids (FISH OIL) 1000 MG CAPS Take 1,000 mg by mouth daily.   Yes [provider]  Probiotic Product (PROBIOTIC PO) Take 1 capsule by mouth daily.   Yes [provider]  rosuvastatin  (CRESTOR ) 20 MG tablet TAKE 1 TABLET BY MOUTH DAILY. Patient taking differently: Take 20 mg by mouth at bedtime. 09/03/23  Yes Glendia Shad, MD  traMADol  (ULTRAM ) 50 MG tablet Take 50 mg by mouth every 12 (twelve) hours as needed for moderate pain (pain score 4-6). 02/24/20  Yes [provider]  vitamin E  180 MG (400 UNITS) capsule Take 400 Units by mouth daily.   Yes [provider]  cephALEXin  (KEFLEX ) 500 MG capsule Take 1 capsule (500 mg total) by mouth 3 (three) times daily. 03/04/24   Ashara Lounsbury, Juliene SAUNDERS, DPM  gabapentin  (NEURONTIN ) 300 MG capsule Take 1 capsule (300 mg total) by mouth 3 (three) times daily for 7 days. 03/04/24 03/11/24  Silva Juliene SAUNDERS, DPM  metFORMIN  (GLUCOPHAGE -XR) 500 MG 24 hr tablet TAKE 1 TABLET BY MOUTH TWICE DAILY WITH A MEAL 03/11/24   Glendia Shad, MD     Positive ROS:    All other systems have been reviewed and were otherwise negative with the exception of those mentioned in the HPI and as above.  Physical Exam: Vitals:   02/28/24 1545 02/28/24 1610  BP: 124/78 (!) 139/95  Pulse: 79 86  Resp:  (!) 23 15  Temp: 97.8 F (36.6 C) (!) 97 F (36.1 C)  SpO2: 98% 97%    General: Alert, no acute distress Cardiovascular: No pedal edema. RRR. No murmurs noted Respiratory: No cyanosis, no use of accessory musculature. Breathing unlabored. Lungs clear, no wheezing GI: No organomegaly, abdomen is soft and non-tender Psychiatric: Patient is competent for consent with normal mood and affect Lymphatic: No axillary or cervical lymphadenopathy   LE Focused Exam: warm, good capillary refill, no trophic changes or ulcerative lesions, normal DP and PT pulses, and normal sensory exam. bunion deformity noted  Assessment/Plan: Hammer toe of left foot Hallux valgus with bunions, left Metatarsal deformity, left Injury of toe on left foot Plan for Procedure(s): OSTEOTOMY, METATARSAL BONE CORRECTION, HAMMER TOE FUSION, JOINT, GREAT TOE PROCEDURE, BONE GRAFT RECONSTRUCTION, TOE, FOR ANGULAR DEFORMITY  All risks, benefits and potential complications including but not limited to pain, swelling, infection, scar, numbness which may be temporary or permanent, chronic pain, stiffness, nerve pain or damage, wound healing problems, bone healing problems including delayed or non-union, blood clots, cardiopulmonary complications, morbidity, mortality discussed prior to the procedure. All questions addressed. Informed consent signed and reviewed. She is willing to proceed with surgery today.       Juliene Silva, DPM 02/28/24

## 2024-03-02 ENCOUNTER — Encounter: Payer: Self-pay | Admitting: Podiatry

## 2024-03-02 NOTE — Anesthesia Postprocedure Evaluation (Signed)
 Anesthesia Post Note  Patient: Caitlin Bennett  Procedure(s) Performed: OSTEOTOMY, METATARSAL BONE (Left: Toe) CORRECTION, HAMMER TOE (Left: Toe) FUSION, JOINT, GREAT TOE (Left) PROCEDURE, BONE GRAFT (Left: Foot) RECONSTRUCTION, TOE, FOR ANGULAR DEFORMITY (Left: Toe)  Patient location during evaluation: PACU Anesthesia Type: General Level of consciousness: awake and alert Pain management: pain level controlled Vital Signs Assessment: post-procedure vital signs reviewed and stable Respiratory status: spontaneous breathing, nonlabored ventilation and respiratory function stable Cardiovascular status: blood pressure returned to baseline and stable Postop Assessment: no apparent nausea or vomiting Anesthetic complications: no   There were no known notable events for this encounter.   Last Vitals:  Vitals:   02/28/24 1545 02/28/24 1610  BP: 124/78 (!) 139/95  Pulse: 79 86  Resp: (!) 23 15  Temp: 36.6 C (!) 36.1 C  SpO2: 98% 97%    Last Pain:  Vitals:   02/29/24 1524  TempSrc:   PainSc: 7                  Camellia Merilee Louder

## 2024-03-03 ENCOUNTER — Encounter: Payer: Self-pay | Admitting: Podiatry

## 2024-03-04 ENCOUNTER — Ambulatory Visit (INDEPENDENT_AMBULATORY_CARE_PROVIDER_SITE_OTHER): Admitting: Podiatry

## 2024-03-04 ENCOUNTER — Ambulatory Visit

## 2024-03-04 VITALS — Ht 61.0 in | Wt 152.0 lb

## 2024-03-04 DIAGNOSIS — M2042 Other hammer toe(s) (acquired), left foot: Secondary | ICD-10-CM | POA: Diagnosis not present

## 2024-03-04 DIAGNOSIS — M21962 Unspecified acquired deformity of left lower leg: Secondary | ICD-10-CM

## 2024-03-04 DIAGNOSIS — M2012 Hallux valgus (acquired), left foot: Secondary | ICD-10-CM

## 2024-03-04 DIAGNOSIS — M21612 Bunion of left foot: Secondary | ICD-10-CM

## 2024-03-04 MED ORDER — GABAPENTIN 300 MG PO CAPS: 300.0000 mg | ORAL_CAPSULE | Freq: Three times a day (TID) | ORAL | 0 refills | Status: DC

## 2024-03-04 MED ORDER — CEPHALEXIN 500 MG PO CAPS: 500.0000 mg | ORAL_CAPSULE | Freq: Three times a day (TID) | ORAL | 0 refills | Status: DC

## 2024-03-04 NOTE — Progress Notes (Signed)
  Subjective:  [name], female    DOB: Feb 17, 1946,  MRN: [id number]  Chief Complaint  Patient presents with   Post-op Follow-up    Rm 2 POV # 1 DOS 02/28/24 LT GREAT TOE FUSION W/ BONE GRAFT FROM HEEL, SHORTENING 2ND MET W/HAMMERTOE CORRECTION AND LIGAMENT REPAIR.Patient states moderate pain. Surgical site is slightly swollen with some bruising by the toes and all sutures are intact.      78 y.o. female returns for post-op check.  Overall doing well had significant pain Friday night started to ease off on Saturday only having to take oxycodone  at night now the gabapentin  has been very helpful  Review of Systems: Negative except as noted in the HPI. Denies N/V/F/Ch.   Objective:  There were no vitals filed for this visit. Body mass index is 28.72 kg/m. Constitutional Well developed. Well nourished.  Vascular Foot warm and well perfused. Capillary refill normal to all digits.  Calf is soft and supple, no posterior calf or knee pain, negative Homans' sign  Neurologic Normal speech. Oriented to person, place, and time. Epicritic sensation to light touch grossly present bilaterally.  Dermatologic Skin healing well and is well coapted there is minor erythema on the plantar second MTP joint and some serous drainage on the dorsal incision of the while  Orthopedic: Tenderness to palpation noted about the surgical site.   Multiple view plain film radiographs: Correction noted with MTP fusion hammertoe fixation with screw and shortening second metatarsal osteotomy Assessment:   1. Hammertoe of left foot   2. Hallux valgus with bunions, left   3. Metatarsal deformity, left    Plan:  Patient was evaluated and treated and all questions answered.  S/p foot surgery left -Progressing as expected post-operatively.  Some erythema present.  Will place her on cephalexin  as a precaution. -XR: Noted above no issues -WB Status: Nonweightbearing in CAM Walker boot -Sutures: Return in  2 weeks to remove. -Medications: Refill of gabapentin  and cephalexin  7-day course sent to pharmacy -Foot redressed.  No follow-ups on file.

## 2024-03-10 ENCOUNTER — Other Ambulatory Visit: Payer: Self-pay | Admitting: Internal Medicine

## 2024-03-10 DIAGNOSIS — R7303 Prediabetes: Secondary | ICD-10-CM

## 2024-03-18 ENCOUNTER — Telehealth: Payer: Self-pay | Admitting: Podiatry

## 2024-03-18 ENCOUNTER — Ambulatory Visit (INDEPENDENT_AMBULATORY_CARE_PROVIDER_SITE_OTHER): Admitting: Podiatry

## 2024-03-18 VITALS — Ht 61.0 in | Wt 152.0 lb

## 2024-03-18 DIAGNOSIS — M21612 Bunion of left foot: Secondary | ICD-10-CM

## 2024-03-18 DIAGNOSIS — M2042 Other hammer toe(s) (acquired), left foot: Secondary | ICD-10-CM

## 2024-03-18 DIAGNOSIS — M21962 Unspecified acquired deformity of left lower leg: Secondary | ICD-10-CM

## 2024-03-18 DIAGNOSIS — M2012 Hallux valgus (acquired), left foot: Secondary | ICD-10-CM

## 2024-03-18 NOTE — Telephone Encounter (Signed)
 Received several calls from [facility] and they were looking for H&P for this pts surgery. I see the note from Dr Silva that says H&P and I explained that to her.

## 2024-03-18 NOTE — Progress Notes (Signed)
  Subjective:  [name], female    DOB: 02-23-1946,  MRN: [id number]  Chief Complaint  Patient presents with   Post-op Follow-up    Rm 6 POV # 2 DOS 02/28/24 LT GREAT TOE FUSION W/ BONE GRAFT FROM HEEL, SHORTENING 2ND MET W/HAMMERTOE CORRECTION AND LIGAMENT REPAIR. All sutures are intact, significant swelling of the phalanges and the dorsal aspect of the foot.      78 y.o. female returns for post-op check.  Other than swelling doing okay pain is improving  Review of Systems: Negative except as noted in the HPI. Denies N/V/F/Ch.   Objective:  There were no vitals filed for this visit. Body mass index is 28.72 kg/m. Constitutional Well developed. Well nourished.  Vascular Foot warm and well perfused. Capillary refill normal to all digits.  Calf is soft and supple, no posterior calf or knee pain, negative Homans' sign  Neurologic Normal speech. Oriented to person, place, and time. Epicritic sensation to light touch grossly present bilaterally.  Dermatologic All incisions are well-healed and are nonhypertrophic  Orthopedic: Tenderness to palpation noted about the surgical site.   Multiple view plain film radiographs: Correction noted with MTP fusion hammertoe fixation with screw and shortening second metatarsal osteotomy Assessment:   1. Hammertoe of left foot   2. Hallux valgus with bunions, left   3. Metatarsal deformity, left     Plan:  Patient was evaluated and treated and all questions answered.  S/p foot surgery left Sutures removed begin weightbearing as tolerated in cam boot.  Return in 4 weeks for new x-rays and transition back to surgical shoe and then shoe gear.  Encouraged plantarflexion motion of the toe of the second toe No follow-ups on file.

## 2024-04-02 ENCOUNTER — Encounter

## 2024-04-08 ENCOUNTER — Encounter: Admitting: Podiatry

## 2024-04-13 ENCOUNTER — Ambulatory Visit

## 2024-04-13 ENCOUNTER — Ambulatory Visit (INDEPENDENT_AMBULATORY_CARE_PROVIDER_SITE_OTHER): Admitting: Podiatry

## 2024-04-13 ENCOUNTER — Encounter: Admitting: Podiatry

## 2024-04-13 VITALS — Ht 61.0 in | Wt 152.0 lb

## 2024-04-13 DIAGNOSIS — M2042 Other hammer toe(s) (acquired), left foot: Secondary | ICD-10-CM | POA: Diagnosis not present

## 2024-04-13 DIAGNOSIS — M2012 Hallux valgus (acquired), left foot: Secondary | ICD-10-CM

## 2024-04-13 DIAGNOSIS — M21612 Bunion of left foot: Secondary | ICD-10-CM

## 2024-04-13 NOTE — Progress Notes (Signed)
 Caitlin Bennett                                          MRN: [id number]   04/13/2024   The [facility] Quality Team Specialist reviewed this patient medical record for the purposes of chart review for care gap closure. The following were reviewed: chart review for care gap closure-kidney health evaluation for diabetes:eGFR  and uACR.    [facility] Quality Team

## 2024-04-14 ENCOUNTER — Ambulatory Visit: Payer: Self-pay | Admitting: Internal Medicine

## 2024-04-14 ENCOUNTER — Encounter: Payer: Self-pay | Admitting: Podiatry

## 2024-04-14 ENCOUNTER — Other Ambulatory Visit (INDEPENDENT_AMBULATORY_CARE_PROVIDER_SITE_OTHER)

## 2024-04-14 DIAGNOSIS — I10 Essential (primary) hypertension: Secondary | ICD-10-CM

## 2024-04-14 DIAGNOSIS — R739 Hyperglycemia, unspecified: Secondary | ICD-10-CM

## 2024-04-14 DIAGNOSIS — D473 Essential (hemorrhagic) thrombocythemia: Secondary | ICD-10-CM | POA: Diagnosis not present

## 2024-04-14 DIAGNOSIS — E785 Hyperlipidemia, unspecified: Secondary | ICD-10-CM

## 2024-04-14 LAB — BASIC METABOLIC PANEL WITH GFR
BUN: 22 mg/dL (ref 6–23)
CO2: 28 meq/L (ref 19–32)
Calcium: 9.9 mg/dL (ref 8.4–10.5)
Chloride: 103 meq/L (ref 96–112)
Creatinine, Ser: 0.82 mg/dL (ref 0.40–1.20)
GFR: 67.95 mL/min (ref >60.00)
Glucose, Bld: 105 mg/dL — ABNORMAL HIGH (ref 70–99)
Potassium: 4.9 meq/L (ref 3.5–5.1)
Sodium: 139 meq/L (ref 135–145)

## 2024-04-14 LAB — CBC WITH DIFFERENTIAL/PLATELET
Basophils Absolute: 0.1 K/uL (ref 0.0–0.1)
Basophils Relative: 1.2 % (ref 0.0–3.0)
Eosinophils Absolute: 0.2 K/uL (ref 0.0–0.7)
Eosinophils Relative: 4 % (ref 0.0–5.0)
HCT: 39.8 % (ref 36.0–46.0)
Hemoglobin: 13.2 g/dL (ref 12.0–15.0)
Lymphocytes Relative: 32.2 % (ref 12.0–46.0)
Lymphs Abs: 1.9 K/uL (ref 0.7–4.0)
MCHC: 33.1 g/dL (ref 30.0–36.0)
MCV: 88.5 fl (ref 78.0–100.0)
Monocytes Absolute: 0.4 K/uL (ref 0.1–1.0)
Monocytes Relative: 7.3 % (ref 3.0–12.0)
Neutro Abs: 3.2 K/uL (ref 1.4–7.7)
Neutrophils Relative %: 55.3 % (ref 43.0–77.0)
Platelets: 567 K/uL — ABNORMAL HIGH (ref 150.0–400.0)
RBC: 4.49 Mil/uL (ref 3.87–5.11)
RDW: 14.6 % (ref 11.5–15.5)
WBC: 5.8 K/uL (ref 4.0–10.5)

## 2024-04-14 LAB — HEPATIC FUNCTION PANEL
ALT: 12 U/L (ref 0–35)
AST: 16 U/L (ref 0–37)
Albumin: 4.3 g/dL (ref 3.5–5.2)
Alkaline Phosphatase: 52 U/L (ref 39–117)
Bilirubin, Direct: 0.1 mg/dL (ref 0.0–0.3)
Total Bilirubin: 0.4 mg/dL (ref 0.2–1.2)
Total Protein: 7.1 g/dL (ref 6.0–8.3)

## 2024-04-14 LAB — HEMOGLOBIN A1C: Hgb A1c MFr Bld: 6.1 % (ref 4.6–6.5)

## 2024-04-14 LAB — LIPID PANEL
Cholesterol: 129 mg/dL (ref 0–200)
HDL: 36.4 mg/dL — ABNORMAL LOW (ref >39.00)
LDL Cholesterol: 52 mg/dL (ref 0–99)
NonHDL: 92.36
Total CHOL/HDL Ratio: 4
Triglycerides: 201 mg/dL — ABNORMAL HIGH (ref 0.0–149.0)
VLDL: 40.2 mg/dL — ABNORMAL HIGH (ref 0.0–40.0)

## 2024-04-14 NOTE — Progress Notes (Signed)
  Subjective:  [name], female    DOB: 1945/10/22,  MRN: [id number]  Chief Complaint  Patient presents with   Foot Pain    Rm 8 POV # 3 DOS 02/28/24 LT GREAT TOE FUSION W/ BONE GRAFT FROM HEEL, SHORTENING 2ND MET W/HAMMERTOE CORRECTION AND LIGAMENT REPAIR      78 y.o. female returns for post-op check.  She is doing well Review of Systems: Negative except as noted in the HPI. Denies N/V/F/Ch.   Objective:  There were no vitals filed for this visit. Body mass index is 28.72 kg/m. Constitutional Well developed. Well nourished.  Vascular Foot warm and well perfused. Capillary refill normal to all digits.  Calf is soft and supple, no posterior calf or knee pain, negative Homans' sign  Neurologic Normal speech. Oriented to person, place, and time. Epicritic sensation to light touch grossly present bilaterally.  Dermatologic All incisions are well-healed and are nonhypertrophic  Orthopedic: Tenderness to palpation noted about the surgical site.   Multiple view plain film radiographs: Consolidation across fusion and osteotomy sites Assessment:   1. Hammertoe of left foot   2. Hallux valgus with bunions, left     Plan:  Patient was evaluated and treated and all questions answered.  S/p foot surgery left Transition today to surgical shoe over the next few weeks, still has some edema that will prevent her from getting into shoe gear.  May gradually return to shoe gear in 2 to 3 weeks.  Return in 6 weeks for new final radiographs.  Return in about 6 weeks (around 05/25/2024) for post op (new x-rays).

## 2024-04-16 ENCOUNTER — Ambulatory Visit: Admitting: Internal Medicine

## 2024-04-16 VITALS — BP 122/70 | HR 85 | Resp 16 | Ht 61.0 in | Wt 155.2 lb

## 2024-04-16 DIAGNOSIS — M545 Low back pain, unspecified: Secondary | ICD-10-CM

## 2024-04-16 DIAGNOSIS — I10 Essential (primary) hypertension: Secondary | ICD-10-CM

## 2024-04-16 DIAGNOSIS — D473 Essential (hemorrhagic) thrombocythemia: Secondary | ICD-10-CM

## 2024-04-16 DIAGNOSIS — Z23 Encounter for immunization: Secondary | ICD-10-CM | POA: Diagnosis not present

## 2024-04-16 DIAGNOSIS — E785 Hyperlipidemia, unspecified: Secondary | ICD-10-CM | POA: Diagnosis not present

## 2024-04-16 DIAGNOSIS — R739 Hyperglycemia, unspecified: Secondary | ICD-10-CM | POA: Diagnosis not present

## 2024-04-16 DIAGNOSIS — Z0000 Encounter for general adult medical examination without abnormal findings: Secondary | ICD-10-CM

## 2024-04-16 NOTE — Progress Notes (Signed)
 Subjective:    [name], female    DOB: [birth year], 78 y.o.   MRN: [id number]  Patient here for  Chief Complaint  Patient presents with   Annual Exam    HPI Here for a physical exam. S/p recent foot surgery. Seeing podiatry. Had f/u 04/13/24 - recommended transition to surgical shoe over the next few weeks. Continues on lisinopril  and crestor . Has seen Dr Avanell - last 05/2023. Followed for chronic lumbosacral pain. Currently on tramadol . Felt gabapentin  helped. Was questioning changing from tramadol  to gabapentin . Plans to discuss with physiatry (Chasnis). Metamucil - keeping bowels moving. No chest pain or sob reported. No abdominal pain reported.    Past Medical History:  Diagnosis Date   Blood in the stool    10 yrs ago   COVID-19 07/01/2021   Runny nose 12/15. (+) test 12/17.  Symptom resolution 07/02/21.   Diverticulitis    GERD (gastroesophageal reflux disease)    Hallux valgus with bunions, left    History of abnormal Pap smear    History of chicken pox    History of colon polyps    History of kidney stones    Hypercholesterolemia    Lumbosacral spondylolysis    Osteoarthritis, knee    Pre-diabetes    Primary hypertension    Seronegative rheumatoid arthritis (HCC)    Spondylosis of lumbar region without myelopathy or radiculopathy    Thrombocytosis    Past Surgical History:  Procedure Laterality Date   CATARACT EXTRACTION W/PHACO Right 07/12/2021   Procedure: CATARACT EXTRACTION PHACO AND INTRAOCULAR LENS PLACEMENT (IOC) RIGHT TORIC LENS;  Surgeon: Mittie Gaskin, MD;  Location: [facility];  Service: Ophthalmology;  Laterality: Right;  7.70 1:08.7   CATARACT EXTRACTION W/PHACO Left 07/26/2021   Procedure: CATARACT EXTRACTION PHACO AND INTRAOCULAR LENS PLACEMENT (IOC) LEFT TORIC LENS 6.24 01:02.2;  Surgeon: Mittie Gaskin, MD;  Location: [facility];  Service: Ophthalmology;  Laterality: Left;   COLONOSCOPY     DILATION  AND CURETTAGE OF UTERUS     HALLUX FUSION Left 02/28/2024   Procedure: FUSION, JOINT, GREAT TOE;  Surgeon: Silva Juliene SAUNDERS, DPM;  Location: [facility];  Service: Orthopedics/Podiatry;  Laterality: Left;  HALUX MPJ FUSION   HAMMER TOE SURGERY Left 02/28/2024   Procedure: CORRECTION, HAMMER TOE;  Surgeon: Silva Juliene SAUNDERS, DPM;  Location: [facility];  Service: Orthopedics/Podiatry;  Laterality: Left;   HARVEST BONE GRAFT Left 02/28/2024   Procedure: PROCEDURE, BONE GRAFT;  Surgeon: Silva Juliene SAUNDERS, DPM;  Location: [facility];  Service: Orthopedics/Podiatry;  Laterality: Left;   JOINT REPLACEMENT  2019   knee replacement   KNEE ARTHROPLASTY Right 05/13/2019   Procedure: COMPUTER ASSISTED TOTAL KNEE ARTHROPLASTY RIGHT;  Surgeon: Mardee Lynwood SQUIBB, MD;  Location: [facility];  Service: Orthopedics;  Laterality: Right;   LEG / ANKLE SOFT TISSUE BIOPSY Right 2011   to confirm arthritis   METATARSAL OSTEOTOMY Left 02/28/2024   Procedure: OSTEOTOMY, METATARSAL BONE;  Surgeon: Silva Juliene SAUNDERS, DPM;  Location: [facility];  Service: Orthopedics/Podiatry;  Laterality: Left;  BLOCK OR EXPAREL    RECONSTRUCTION OF ANGULAR DEFORMITY,TOE Left 02/28/2024   Procedure: RECONSTRUCTION, TOE, FOR ANGULAR DEFORMITY;  Surgeon: Silva Juliene SAUNDERS, DPM;  Location: [facility];  Service: Orthopedics/Podiatry;  Laterality: Left;   Family History  Problem Relation Age of Onset   Arthritis Father    Hyperlipidemia Father    Heart disease Father    Hypertension Father    Diabetes Father    Pulmonary  fibrosis Father    Arthritis Mother    Heart disease Mother    Hyperlipidemia Mother    Hypertension Mother    COPD Mother    Colon cancer Maternal Aunt    Colon cancer Maternal Uncle    Breast cancer Neg Hx    Social History   Socioeconomic History   Marital status: Divorced    Spouse name: Not on file   Number of children: 1   Years of education: Not on file   Highest education level: Some college, no degree  Occupational  History   Not on file  Tobacco Use   Smoking status: Never   Smokeless tobacco: Never  Vaping Use   Vaping status: Never Used  Substance and Sexual Activity   Alcohol use: Yes    Alcohol/week: 0.0 standard drinks of alcohol    Comment: rare   Drug use: No   Sexual activity: Not on file  Other Topics Concern   Not on file  Social History Narrative   Not on file   Social Drivers of Health   Financial Resource Strain: Low Risk  (04/12/2024)   Overall Financial Resource Strain (CARDIA)    Difficulty of Paying Living Expenses: Not hard at all  Food Insecurity: No Food Insecurity (04/12/2024)   Hunger Vital Sign    Worried About Running Out of Food in the Last Year: Never true    Ran Out of Food in the Last Year: Never true  Transportation Needs: No Transportation Needs (04/12/2024)   PRAPARE - Administrator, Civil Service (Medical): No    Lack of Transportation (Non-Medical): No  Physical Activity: Unknown (04/12/2024)   Exercise Vital Sign    Days of Exercise per Week: Patient declined    Minutes of Exercise per Session: Not on file  Stress: No Stress Concern Present (04/12/2024)   [employer] of Occupational Health - Occupational Stress Questionnaire    Feeling of Stress: Not at all  Social Connections: Moderately Integrated (04/12/2024)   Social Connection and Isolation Panel    Frequency of Communication with Friends and Family: More than three times a week    Frequency of Social Gatherings with Friends and Family: More than three times a week    Attends Religious Services: More than 4 times per year    Active Member of [employer] or Organizations: Yes    Attends Engineer, structural: More than 4 times per year    Marital Status: Divorced     Review of Systems  Constitutional:  Negative for appetite change and unexpected weight change.  HENT:  Negative for congestion, sinus pressure and sore throat.   Eyes:  Negative for pain and visual disturbance.   Respiratory:  Negative for cough, chest tightness and shortness of breath.   Cardiovascular:  Negative for chest pain, palpitations and leg swelling.  Gastrointestinal:  Negative for abdominal pain, diarrhea, nausea and vomiting.  Genitourinary:  Negative for difficulty urinating and dysuria.  Musculoskeletal:  Negative for joint swelling and myalgias.  Skin:  Negative for color change and rash.  Neurological:  Negative for dizziness and headaches.  Hematological:  Negative for adenopathy. Does not bruise/bleed easily.  Psychiatric/Behavioral:  Negative for agitation and dysphoric mood.        Objective:     BP 122/70   Pulse 85   Resp 16   Ht 5' 1 (1.549 m)   Wt 155 lb 3.2 oz (70.4 kg)   LMP 07/16/1996  SpO2 98%   BMI 29.32 kg/m  Wt Readings from Last 3 Encounters:  04/16/24 155 lb 3.2 oz (70.4 kg)  04/13/24 152 lb (68.9 kg)  03/18/24 152 lb (68.9 kg)    Physical Exam Vitals reviewed.  Constitutional:      General: She is not in acute distress.    Appearance: Normal appearance. She is well-developed.  HENT:     Head: Normocephalic and atraumatic.     Right Ear: External ear normal.     Left Ear: External ear normal.     Mouth/Throat:     Pharynx: No oropharyngeal exudate or posterior oropharyngeal erythema.  Eyes:     General: No scleral icterus.       Right eye: No discharge.        Left eye: No discharge.     Conjunctiva/sclera: Conjunctivae normal.  Neck:     Thyroid : No thyromegaly.  Cardiovascular:     Rate and Rhythm: Normal rate and regular rhythm.  Pulmonary:     Effort: No tachypnea, accessory muscle usage or respiratory distress.     Breath sounds: Normal breath sounds. No decreased breath sounds, wheezing or rhonchi.  Chest:  Breasts:    Right: No inverted nipple, mass, nipple discharge or tenderness (no axillary adenopathy).     Left: No inverted nipple, mass, nipple discharge or tenderness (no axilarry adenopathy).  Abdominal:     General:  Bowel sounds are normal.     Palpations: Abdomen is soft.     Tenderness: There is no abdominal tenderness.  Musculoskeletal:        General: No swelling or tenderness.     Cervical back: Neck supple.  Lymphadenopathy:     Cervical: No cervical adenopathy.  Skin:    General: Skin is warm.     Findings: No erythema or rash.  Neurological:     Mental Status: She is alert and oriented to person, place, and time.  Psychiatric:        Mood and Affect: Mood normal.        Behavior: Behavior normal.         Outpatient Encounter Medications as of 04/16/2024  Medication Sig   acetaminophen  (TYLENOL ) 650 MG CR tablet Take 650 mg by mouth in the morning and at bedtime.   aspirin  EC 81 MG tablet Take 81 mg by mouth daily.   calcium  carbonate (TUMS EX) 750 MG chewable tablet Chew 1 tablet by mouth daily.   diphenhydramine -acetaminophen  (TYLENOL  PM) 25-500 MG TABS tablet Take 1 tablet by mouth at bedtime.   gabapentin  (NEURONTIN ) 300 MG capsule Take 1 capsule (300 mg total) by mouth 3 (three) times daily for 7 days.   hydrocortisone -pramoxine (ANALPRAM-HC) 2.5-1 % rectal cream PLACE 1 APPLICATION RECTALLY 2 (TWO) TIMES DAILY.   lisinopril  (ZESTRIL ) 10 MG tablet TAKE 1 TABLET DAILY   metFORMIN  (GLUCOPHAGE -XR) 500 MG 24 hr tablet TAKE 1 TABLET BY MOUTH TWICE DAILY WITH A MEAL   Multiple Vitamin (MULTIVITAMIN WITH MINERALS) TABS tablet Take 1 tablet by mouth daily.   Omega-3 Fatty Acids (FISH OIL) 1000 MG CAPS Take 1,000 mg by mouth daily.   Probiotic Product (PROBIOTIC PO) Take 1 capsule by mouth daily.   rosuvastatin  (CRESTOR ) 20 MG tablet TAKE 1 TABLET BY MOUTH DAILY.   traMADol  (ULTRAM ) 50 MG tablet Take 50 mg by mouth every 12 (twelve) hours as needed for moderate pain (pain score 4-6).   [DISCONTINUED] cephALEXin  (KEFLEX ) 500 MG capsule Take 1 capsule (500 mg  total) by mouth 3 (three) times daily.   [DISCONTINUED] vitamin E  180 MG (400 UNITS) capsule Take 400 Units by mouth daily.   No  facility-administered encounter medications on file as of 04/16/2024.     Lab Results  Component Value Date   WBC 5.8 04/14/2024   HGB 13.2 04/14/2024   HCT 39.8 04/14/2024   PLT 567.0 (H) 04/14/2024   GLUCOSE 105 (H) 04/14/2024   CHOL 129 04/14/2024   TRIG 201.0 (H) 04/14/2024   HDL 36.40 (L) 04/14/2024   LDLDIRECT 92.0 04/10/2022   LDLCALC 52 04/14/2024   ALT 12 04/14/2024   AST 16 04/14/2024   NA 139 04/14/2024   K 4.9 04/14/2024   CL 103 04/14/2024   CREATININE 0.82 04/14/2024   BUN 22 04/14/2024   CO2 28 04/14/2024   TSH 2.22 11/19/2023   INR 0.9 07/15/2019   HGBA1C 6.1 04/14/2024    DG MINI C-ARM IMAGE ONLY Result Date: 02/28/2024 There is no interpretation for this exam.  This order is for images obtained during a surgical procedure.  Please See Surgeries Tab for more information regarding the procedure.       Assessment & Plan:  Health care maintenance Assessment & Plan: Physical today 04/16/24..  Mammogram 02/10/24 - Birads I. Colonoscopy recently as outlined - 07/2020. Requested report.    Primary hypertension Assessment & Plan: Continue lisinopril .  Blood pressure as outlined.  No changes.  Follow metabolic panel. No changes today.   Orders: -     Basic metabolic panel with GFR; Future  Hyperlipidemia, unspecified hyperlipidemia type Assessment & Plan: On crestor .  Low cholesterol diet and exercise.  Follow lipid panel and liver function tests. No changes in medication today.  Lab Results  Component Value Date   CHOL 129 04/14/2024   HDL 36.40 (L) 04/14/2024   LDLCALC 52 04/14/2024   LDLDIRECT 92.0 04/10/2022   TRIG 201.0 (H) 04/14/2024   CHOLHDL 4 04/14/2024     Orders: -     Hepatic function panel; Future -     Lipid panel; Future  Hyperglycemia Assessment & Plan: Low carb diet and exercise. Follow met b and A1c.  Not on medication.  Lab Results  Component Value Date   HGBA1C 6.1 04/14/2024     Orders: -     Hemoglobin A1c;  Future  Immunization due -     Flu vaccine HIGH DOSE PF(Fluzone Trivalent)  Essential thrombocytosis (HCC) Assessment & Plan: Minimal elevation.  Has seen hematology.  Recommended continued f/u.  Follow cbc to confirm remains stable.    Lumbosacral pain Assessment & Plan: Chronic. Plans to discuss with physiatry regarding changing tramadol  to gabapentin .       Allena Hamilton, MD

## 2024-04-16 NOTE — Assessment & Plan Note (Addendum)
 Physical today 04/16/24..  Mammogram 02/10/24 - Birads I. Colonoscopy recently as outlined - 07/2020. Requested report.

## 2024-04-19 ENCOUNTER — Encounter: Payer: Self-pay | Admitting: Internal Medicine

## 2024-04-19 DIAGNOSIS — M545 Low back pain, unspecified: Secondary | ICD-10-CM | POA: Insufficient documentation

## 2024-04-19 NOTE — Assessment & Plan Note (Signed)
 On crestor .  Low cholesterol diet and exercise.  Follow lipid panel and liver function tests. No changes in medication today.  Lab Results  Component Value Date   CHOL 129 04/14/2024   HDL 36.40 (L) 04/14/2024   LDLCALC 52 04/14/2024   LDLDIRECT 92.0 04/10/2022   TRIG 201.0 (H) 04/14/2024   CHOLHDL 4 04/14/2024

## 2024-04-19 NOTE — Assessment & Plan Note (Signed)
 Low carb diet and exercise. Follow met b and A1c.  Not on medication.  Lab Results  Component Value Date   HGBA1C 6.1 04/14/2024

## 2024-04-19 NOTE — Assessment & Plan Note (Signed)
 Minimal elevation.  Has seen hematology.  Recommended continued f/u.  Follow cbc to confirm remains stable.

## 2024-04-19 NOTE — Assessment & Plan Note (Signed)
 Continue lisinopril .  Blood pressure as outlined.  No changes.  Follow metabolic panel. No changes today.

## 2024-04-19 NOTE — Assessment & Plan Note (Signed)
 Chronic. Plans to discuss with physiatry regarding changing tramadol  to gabapentin .

## 2024-04-21 ENCOUNTER — Encounter

## 2024-04-24 NOTE — Progress Notes (Signed)
 Caitlin Bennett                                          MRN: [id number]   04/24/2024   The [facility] Quality Team Specialist reviewed this patient medical record for the purposes of chart review for care gap closure. The following were reviewed: abstraction for care gap closure-controlling blood pressure.    [facility] Quality Team

## 2024-05-27 ENCOUNTER — Ambulatory Visit

## 2024-05-27 ENCOUNTER — Ambulatory Visit (INDEPENDENT_AMBULATORY_CARE_PROVIDER_SITE_OTHER): Admitting: Podiatry

## 2024-05-27 VITALS — Ht 61.0 in | Wt 155.2 lb

## 2024-05-27 DIAGNOSIS — M2042 Other hammer toe(s) (acquired), left foot: Secondary | ICD-10-CM | POA: Diagnosis not present

## 2024-05-27 DIAGNOSIS — M21612 Bunion of left foot: Secondary | ICD-10-CM

## 2024-05-27 DIAGNOSIS — M2012 Hallux valgus (acquired), left foot: Secondary | ICD-10-CM

## 2024-05-28 NOTE — Progress Notes (Signed)
  Subjective:  [name], female    DOB: Apr 17, 1946,  MRN: [id number]  Chief Complaint  Patient presents with   Post-op Follow-up    RM 2 Patient is here for visit for hammertoe of the left foot. Post op new x-rays. Pt states no pain, just an intermittent sensation of tightness in the dorsal aspect of the left foot.      78 y.o. female returns for post-op check.  She is doing well Review of Systems: Negative except as noted in the HPI. Denies N/V/F/Ch.   Objective:  There were no vitals filed for this visit. Body mass index is 29.32 kg/m. Constitutional Well developed. Well nourished.  Vascular Foot warm and well perfused. Capillary refill normal to all digits.  Calf is soft and supple, no posterior calf or knee pain, negative Homans' sign  Neurologic Normal speech. Oriented to person, place, and time. Epicritic sensation to light touch grossly present bilaterally.  Dermatologic All incisions are well-healed and are nonhypertrophic  Orthopedic: She has no pain to palpation noted about the surgical site.  There is some limited range of motion of the second MTP joint   Multiple view plain film radiographs: Consolidation across fusion and osteotomy sites Assessment:   1. Hammertoe of left foot   2. Hallux valgus with bunions, left     Plan:  Patient was evaluated and treated and all questions answered.  S/p foot surgery left Doing very well I have no restrictions for her and may utilize all shoe gear as tolerated.  She would like to look into surgery on her right foot next winter.  She will follow me in 3 months for new radiographs of both feet and sooner if there are issues. No follow-ups on file.

## 2024-06-04 NOTE — Progress Notes (Signed)
 [name]                                          MRN: [id number]   06/04/2024   The [facility] Quality Team Specialist reviewed this patient medical record for the purposes of chart review for care gap closure. The following were reviewed: chart review for care gap closure-kidney health evaluation for diabetes:eGFR  and uACR.    [facility] Quality Team

## 2024-06-25 DIAGNOSIS — Z79899 Other long term (current) drug therapy: Secondary | ICD-10-CM | POA: Diagnosis not present

## 2024-06-25 DIAGNOSIS — M47816 Spondylosis without myelopathy or radiculopathy, lumbar region: Secondary | ICD-10-CM | POA: Diagnosis not present

## 2024-07-02 NOTE — Progress Notes (Signed)
 KAM KUSHNIR                                          MRN: [id number]   07/02/2024   The [facility] Quality Team Specialist reviewed this patient medical record for the purposes of chart review for care gap closure. The following were reviewed: chart review for care gap closure-kidney health evaluation for diabetes:eGFR  and uACR.    [facility] Quality Team

## 2024-07-07 ENCOUNTER — Other Ambulatory Visit: Payer: Self-pay | Admitting: Internal Medicine

## 2024-07-28 ENCOUNTER — Ambulatory Visit

## 2024-08-14 ENCOUNTER — Other Ambulatory Visit (INDEPENDENT_AMBULATORY_CARE_PROVIDER_SITE_OTHER)

## 2024-08-14 DIAGNOSIS — E785 Hyperlipidemia, unspecified: Secondary | ICD-10-CM

## 2024-08-14 DIAGNOSIS — I10 Essential (primary) hypertension: Secondary | ICD-10-CM | POA: Diagnosis not present

## 2024-08-14 DIAGNOSIS — R739 Hyperglycemia, unspecified: Secondary | ICD-10-CM | POA: Diagnosis not present

## 2024-08-14 LAB — HEPATIC FUNCTION PANEL
ALT: 13 U/L (ref 3–35)
AST: 18 U/L (ref 5–37)
Albumin: 4.2 g/dL (ref 3.5–5.2)
Alkaline Phosphatase: 57 U/L (ref 39–117)
Bilirubin, Direct: 0.1 mg/dL (ref 0.1–0.3)
Total Bilirubin: 0.4 mg/dL (ref 0.2–1.2)
Total Protein: 7 g/dL (ref 6.0–8.3)

## 2024-08-14 LAB — BASIC METABOLIC PANEL WITH GFR
BUN: 27 mg/dL — ABNORMAL HIGH (ref 6–23)
CO2: 28 meq/L (ref 19–32)
Calcium: 9.6 mg/dL (ref 8.4–10.5)
Chloride: 104 meq/L (ref 96–112)
Creatinine, Ser: 0.96 mg/dL (ref 0.40–1.20)
GFR: 56.11 mL/min — ABNORMAL LOW
Glucose, Bld: 102 mg/dL — ABNORMAL HIGH (ref 70–99)
Potassium: 4.8 meq/L (ref 3.5–5.1)
Sodium: 138 meq/L (ref 135–145)

## 2024-08-14 LAB — LIPID PANEL
Cholesterol: 126 mg/dL (ref 28–200)
HDL: 35.5 mg/dL — ABNORMAL LOW
LDL Cholesterol: 40 mg/dL (ref 10–99)
NonHDL: 90.54
Total CHOL/HDL Ratio: 4
Triglycerides: 255 mg/dL — ABNORMAL HIGH (ref 10.0–149.0)
VLDL: 51 mg/dL — ABNORMAL HIGH (ref 0.0–40.0)

## 2024-08-14 LAB — HEMOGLOBIN A1C: Hgb A1c MFr Bld: 6.2 % (ref 4.6–6.5)

## 2024-08-15 ENCOUNTER — Ambulatory Visit: Payer: Self-pay | Admitting: Internal Medicine

## 2024-08-18 ENCOUNTER — Other Ambulatory Visit

## 2024-08-20 ENCOUNTER — Encounter: Payer: Self-pay | Admitting: Internal Medicine

## 2024-08-20 ENCOUNTER — Other Ambulatory Visit: Payer: Self-pay

## 2024-08-20 ENCOUNTER — Ambulatory Visit: Admitting: Internal Medicine

## 2024-08-20 VITALS — BP 126/78 | HR 104 | Temp 98.0°F | Ht 61.0 in | Wt 161.0 lb

## 2024-08-20 DIAGNOSIS — D473 Essential (hemorrhagic) thrombocythemia: Secondary | ICD-10-CM

## 2024-08-20 DIAGNOSIS — R7303 Prediabetes: Secondary | ICD-10-CM

## 2024-08-20 DIAGNOSIS — E785 Hyperlipidemia, unspecified: Secondary | ICD-10-CM

## 2024-08-20 DIAGNOSIS — I10 Essential (primary) hypertension: Secondary | ICD-10-CM

## 2024-08-20 DIAGNOSIS — R739 Hyperglycemia, unspecified: Secondary | ICD-10-CM

## 2024-08-20 MED ORDER — METFORMIN HCL ER 500 MG PO TB24: 500.0000 mg | ORAL_TABLET | Freq: Two times a day (BID) | ORAL | 1 refills | Status: AC

## 2024-08-20 NOTE — Progress Notes (Unsigned)
 "  Subjective:    [name], female    DOB: March 14, 1946, 79 y.o.   MRN: [id number]  Patient here for  Chief Complaint  Patient presents with   Medical Management of Chronic Issues    HPI Here for a scheduled follow up - follow up regarding hypertension and hypercholesterolemia. S/p foot surgery. Had f/u 05/27/24 - doing well. Has been followed by Dr Avanell. Last seen 06/25/24 - f/u chrnoic lumbosacral pain. Recommended to continue tylenol , tramadol  and gabapentin .    Past Medical History:  Diagnosis Date   Blood in the stool    10 yrs ago   COVID-19 07/01/2021   Runny nose 12/15. (+) test 12/17.  Symptom resolution 07/02/21.   Diverticulitis    GERD (gastroesophageal reflux disease)    Hallux valgus with bunions, left    History of abnormal Pap smear    History of chicken pox    History of colon polyps    History of kidney stones    Hypercholesterolemia    Lumbosacral spondylolysis    Osteoarthritis, knee    Pre-diabetes    Primary hypertension    Seronegative rheumatoid arthritis (HCC)    Spondylosis of lumbar region without myelopathy or radiculopathy    Thrombocytosis    Past Surgical History:  Procedure Laterality Date   CATARACT EXTRACTION W/PHACO Right 07/12/2021   Procedure: CATARACT EXTRACTION PHACO AND INTRAOCULAR LENS PLACEMENT (IOC) RIGHT TORIC LENS;  Surgeon: Mittie Gaskin, MD;  Location: [facility];  Service: Ophthalmology;  Laterality: Right;  7.70 1:08.7   CATARACT EXTRACTION W/PHACO Left 07/26/2021   Procedure: CATARACT EXTRACTION PHACO AND INTRAOCULAR LENS PLACEMENT (IOC) LEFT TORIC LENS 6.24 01:02.2;  Surgeon: Mittie Gaskin, MD;  Location: [facility];  Service: Ophthalmology;  Laterality: Left;   COLONOSCOPY     DILATION AND CURETTAGE OF UTERUS     HALLUX FUSION Left 02/28/2024   Procedure: FUSION, JOINT, GREAT TOE;  Surgeon: Silva Juliene SAUNDERS, DPM;  Location: [facility];  Service: Orthopedics/Podiatry;   Laterality: Left;  HALUX MPJ FUSION   HAMMER TOE SURGERY Left 02/28/2024   Procedure: CORRECTION, HAMMER TOE;  Surgeon: Silva Juliene SAUNDERS, DPM;  Location: [facility];  Service: Orthopedics/Podiatry;  Laterality: Left;   HARVEST BONE GRAFT Left 02/28/2024   Procedure: PROCEDURE, BONE GRAFT;  Surgeon: Silva Juliene SAUNDERS, DPM;  Location: [facility];  Service: Orthopedics/Podiatry;  Laterality: Left;   JOINT REPLACEMENT  2019   knee replacement   KNEE ARTHROPLASTY Right 05/13/2019   Procedure: COMPUTER ASSISTED TOTAL KNEE ARTHROPLASTY RIGHT;  Surgeon: Mardee Lynwood SQUIBB, MD;  Location: [facility];  Service: Orthopedics;  Laterality: Right;   LEG / ANKLE SOFT TISSUE BIOPSY Right 2011   to confirm arthritis   METATARSAL OSTEOTOMY Left 02/28/2024   Procedure: OSTEOTOMY, METATARSAL BONE;  Surgeon: Silva Juliene SAUNDERS, DPM;  Location: [facility];  Service: Orthopedics/Podiatry;  Laterality: Left;  BLOCK OR EXPAREL    RECONSTRUCTION OF ANGULAR DEFORMITY,TOE Left 02/28/2024   Procedure: RECONSTRUCTION, TOE, FOR ANGULAR DEFORMITY;  Surgeon: Silva Juliene SAUNDERS, DPM;  Location: [facility];  Service: Orthopedics/Podiatry;  Laterality: Left;   Family History  Problem Relation Age of Onset   Arthritis Father    Hyperlipidemia Father    Heart disease Father    Hypertension Father    Diabetes Father    Pulmonary fibrosis Father    Arthritis Mother    Heart disease Mother    Hyperlipidemia Mother    Hypertension Mother    COPD Mother  Colon cancer Maternal Aunt    Colon cancer Maternal Uncle    Breast cancer Neg Hx    Social History   Socioeconomic History   Marital status: Divorced    Spouse name: Not on file   Number of children: 1   Years of education: Not on file   Highest education level: Some college, no degree  Occupational History   Not on file  Tobacco Use   Smoking status: Never   Smokeless tobacco: Never  Vaping Use   Vaping status: Never Used  Substance and Sexual Activity   Alcohol use: Yes     Alcohol/week: 0.0 standard drinks of alcohol    Comment: rare   Drug use: No   Sexual activity: Not on file  Other Topics Concern   Not on file  Social History Narrative   Not on file   Social Drivers of Health   Tobacco Use: Low Risk (08/20/2024)   Patient History    Smoking Tobacco Use: Never    Smokeless Tobacco Use: Never    Passive Exposure: Not on file  Financial Resource Strain: Low Risk (04/12/2024)   Overall Financial Resource Strain (CARDIA)    Difficulty of Paying Living Expenses: Not hard at all  Food Insecurity: No Food Insecurity (04/12/2024)   Epic    Worried About Radiation Protection Practitioner of Food in the Last Year: Never true    Ran Out of Food in the Last Year: Never true  Transportation Needs: No Transportation Needs (04/12/2024)   Epic    Lack of Transportation (Medical): No    Lack of Transportation (Non-Medical): No  Physical Activity: Unknown (04/12/2024)   Exercise Vital Sign    Days of Exercise per Week: Patient declined    Minutes of Exercise per Session: Not on file  Stress: No Stress Concern Present (04/12/2024)   [employer] of Occupational Health - Occupational Stress Questionnaire    Feeling of Stress: Not at all  Social Connections: Moderately Integrated (04/12/2024)   Social Connection and Isolation Panel    Frequency of Communication with Friends and Family: More than three times a week    Frequency of Social Gatherings with Friends and Family: More than three times a week    Attends Religious Services: More than 4 times per year    Active Member of [employer] or Organizations: Yes    Attends Banker Meetings: More than 4 times per year    Marital Status: Divorced  Depression (PHQ2-9): Low Risk (04/16/2024)   Depression (PHQ2-9)    PHQ-2 Score: 0  Alcohol Screen: Not on file  Housing: Unknown (06/22/2024)   Received from [facility]   Epic    Unable to Pay for Housing in the Last Year: Not on file    Number of Times Moved in  the Last Year: Not on file    At any time in the past 12 months, were you homeless or living in a shelter (including now)?: No  Utilities: Not on file  Health Literacy: Not on file     Review of Systems     Objective:     BP 126/78   Pulse (!) 104   Temp 98 F (36.7 C) (Oral)   Ht 5' 1 (1.549 m)   Wt 161 lb (73 kg)   LMP 07/16/1996   SpO2 97%   BMI 30.42 kg/m  Wt Readings from Last 3 Encounters:  08/20/24 161 lb (73 kg)  05/27/24 155 lb 3.2 oz (  70.4 kg)  04/16/24 155 lb 3.2 oz (70.4 kg)    Physical Exam  {Perform Simple Foot Exam  Perform Detailed exam:1} {Insert foot Exam (Optional):30965}   Outpatient Encounter Medications as of 08/20/2024  Medication Sig   acetaminophen  (TYLENOL ) 650 MG CR tablet Take 650 mg by mouth in the morning and at bedtime.   aspirin  EC 81 MG tablet Take 81 mg by mouth daily.   calcium  carbonate (TUMS EX) 750 MG chewable tablet Chew 1 tablet by mouth daily.   diphenhydramine -acetaminophen  (TYLENOL  PM) 25-500 MG TABS tablet Take 1 tablet by mouth at bedtime.   hydrocortisone -pramoxine (ANALPRAM-HC) 2.5-1 % rectal cream PLACE 1 APPLICATION RECTALLY 2 (TWO) TIMES DAILY.   lisinopril  (ZESTRIL ) 10 MG tablet TAKE 1 TABLET DAILY   metFORMIN  (GLUCOPHAGE -XR) 500 MG 24 hr tablet TAKE 1 TABLET BY MOUTH TWICE DAILY WITH A MEAL   Multiple Vitamin (MULTIVITAMIN WITH MINERALS) TABS tablet Take 1 tablet by mouth daily.   Omega-3 Fatty Acids (FISH OIL) 1000 MG CAPS Take 1,000 mg by mouth daily.   Probiotic Product (PROBIOTIC PO) Take 1 capsule by mouth daily.   rosuvastatin  (CRESTOR ) 20 MG tablet TAKE 1 TABLET DAILY   traMADol  (ULTRAM ) 50 MG tablet Take 50 mg by mouth every 12 (twelve) hours as needed for moderate pain (pain score 4-6).   [DISCONTINUED] gabapentin  (NEURONTIN ) 300 MG capsule Take 1 capsule (300 mg total) by mouth 3 (three) times daily for 7 days.   No facility-administered encounter medications on file as of 08/20/2024.     Lab Results   Component Value Date   WBC 5.8 04/14/2024   HGB 13.2 04/14/2024   HCT 39.8 04/14/2024   PLT 567.0 (H) 04/14/2024   GLUCOSE 102 (H) 08/14/2024   CHOL 126 08/14/2024   TRIG 255.0 (H) 08/14/2024   HDL 35.50 (L) 08/14/2024   LDLDIRECT 92.0 04/10/2022   LDLCALC 40 08/14/2024   ALT 13 08/14/2024   AST 18 08/14/2024   NA 138 08/14/2024   K 4.8 08/14/2024   CL 104 08/14/2024   CREATININE 0.96 08/14/2024   BUN 27 (H) 08/14/2024   CO2 28 08/14/2024   TSH 2.22 11/19/2023   INR 0.9 07/15/2019   HGBA1C 6.2 08/14/2024    DG MINI C-ARM IMAGE ONLY Result Date: 02/28/2024 There is no interpretation for this exam.  This order is for images obtained during a surgical procedure.  Please See Surgeries Tab for more information regarding the procedure.       Assessment & Plan:  Hyperlipidemia, unspecified hyperlipidemia type  Primary hypertension  Essential thrombocytosis (HCC)  Hyperglycemia  Prediabetes     Allena Hamilton, MD "

## 2024-08-20 NOTE — Patient Instructions (Signed)
 Restart omeprazole 20mg  - take one per day.

## 2024-08-25 ENCOUNTER — Other Ambulatory Visit

## 2024-08-26 ENCOUNTER — Ambulatory Visit: Admitting: Podiatry

## 2024-09-01 ENCOUNTER — Ambulatory Visit

## 2024-09-02 ENCOUNTER — Ambulatory Visit

## 2024-10-07 ENCOUNTER — Ambulatory Visit: Admitting: Internal Medicine

## 2024-10-13 ENCOUNTER — Other Ambulatory Visit

## 2024-10-15 ENCOUNTER — Ambulatory Visit: Admitting: Internal Medicine
